# Patient Record
Sex: Female | Born: 1995 | Race: White | Hispanic: No | Marital: Married | Smoking: Former smoker
Health system: Southern US, Community
[De-identification: ages and names within clinical notes are randomized; demographics above are authoritative.]

## PROBLEM LIST (undated history)

## (undated) ENCOUNTER — Inpatient Hospital Stay: Payer: Self-pay

## (undated) DIAGNOSIS — F32A Depression, unspecified: Secondary | ICD-10-CM

## (undated) DIAGNOSIS — F419 Anxiety disorder, unspecified: Secondary | ICD-10-CM

## (undated) DIAGNOSIS — R55 Syncope and collapse: Secondary | ICD-10-CM

## (undated) DIAGNOSIS — N3010 Interstitial cystitis (chronic) without hematuria: Secondary | ICD-10-CM

## (undated) DIAGNOSIS — R000 Tachycardia, unspecified: Secondary | ICD-10-CM

## (undated) DIAGNOSIS — N83209 Unspecified ovarian cyst, unspecified side: Secondary | ICD-10-CM

## (undated) DIAGNOSIS — E559 Vitamin D deficiency, unspecified: Secondary | ICD-10-CM

## (undated) DIAGNOSIS — K219 Gastro-esophageal reflux disease without esophagitis: Secondary | ICD-10-CM

## (undated) DIAGNOSIS — N946 Dysmenorrhea, unspecified: Secondary | ICD-10-CM

## (undated) DIAGNOSIS — R109 Unspecified abdominal pain: Secondary | ICD-10-CM

## (undated) DIAGNOSIS — G90A Postural orthostatic tachycardia syndrome (POTS): Secondary | ICD-10-CM

## (undated) DIAGNOSIS — M5416 Radiculopathy, lumbar region: Secondary | ICD-10-CM

## (undated) DIAGNOSIS — K2970 Gastritis, unspecified, without bleeding: Secondary | ICD-10-CM

## (undated) DIAGNOSIS — N809 Endometriosis, unspecified: Secondary | ICD-10-CM

## (undated) DIAGNOSIS — R112 Nausea with vomiting, unspecified: Secondary | ICD-10-CM

## (undated) DIAGNOSIS — I951 Orthostatic hypotension: Secondary | ICD-10-CM

## (undated) DIAGNOSIS — I498 Other specified cardiac arrhythmias: Secondary | ICD-10-CM

## (undated) HISTORY — PX: OVARIAN CYST SURGERY: SHX726

## (undated) HISTORY — DX: Syncope and collapse: R55

## (undated) HISTORY — DX: Vitamin D deficiency, unspecified: E55.9

## (undated) HISTORY — DX: Dysmenorrhea, unspecified: N94.6

## (undated) HISTORY — PX: TYMPANOSTOMY TUBE PLACEMENT: SHX32

## (undated) HISTORY — DX: Depression, unspecified: F32.A

## (undated) HISTORY — DX: Unspecified ovarian cyst, unspecified side: N83.209

## (undated) HISTORY — DX: Gastritis, unspecified, without bleeding: K29.70

## (undated) HISTORY — DX: Anxiety disorder, unspecified: F41.9

## (undated) HISTORY — PX: TONSILLECTOMY AND ADENOIDECTOMY: SUR1326

## (undated) HISTORY — PX: APPENDECTOMY: SHX54

---
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
## 2015-01-02 ENCOUNTER — Ambulatory Visit (INDEPENDENT_AMBULATORY_CARE_PROVIDER_SITE_OTHER): Payer: No Typology Code available for payment source | Admitting: Internal Medicine

## 2015-01-02 ENCOUNTER — Encounter: Payer: Self-pay | Admitting: Internal Medicine

## 2015-01-02 VITALS — BP 100/80 | HR 79 | Ht 69.0 in | Wt 162.8 lb

## 2015-01-02 DIAGNOSIS — R9431 Abnormal electrocardiogram [ECG] [EKG]: Secondary | ICD-10-CM

## 2015-01-02 DIAGNOSIS — I471 Supraventricular tachycardia: Secondary | ICD-10-CM

## 2015-01-02 DIAGNOSIS — G901 Familial dysautonomia [Riley-Day]: Secondary | ICD-10-CM

## 2015-01-02 DIAGNOSIS — G909 Disorder of the autonomic nervous system, unspecified: Secondary | ICD-10-CM

## 2015-01-02 NOTE — Patient Instructions (Signed)
Your physician has recommended you make the following change in your medication: Start taking Therma Tabs by mouth twice a day.  Visit NDRF.org and [email or web address]  Your physician recommends that you schedule a follow-up appointment in: 6-8 weeks in [place] office with Dr. Graciela HusbandsKlein.

## 2015-01-02 NOTE — Progress Notes (Signed)
ELECTROPHYSIOLOGY CONSULT NOTE  Patient ID: Leah Hartman, MRN: [id number], DOB/AGE: 19/04/1996 18 y.o. Admit date: (Not on file) Date of Consult: 01/02/2015  Primary Physician: Faythe GheeFISHER,SUSAN W, PA-C Primary Cardiologist: Kirke CorinArida  Chief Complaint: syncope    HPI Leah Hartman is a [age] y.o. female  Seen at the request of her primary care team because of syncope in the ECG concerning for Brugada syndrome  She was well until the summer time. She was out running and had a syncopal episode. She was seen by her primary care team and a diagnosis of mononucleosis was made. In the wake of that acute illness she has suffered with ongoing problems with orthostatic intolerance triggered by prolonged standing or standing quickly or bending. She has had a number of episodes of syncope associated with such maneuvers; her sister who is a nurse described her as being extremely pale with this and with a difficult to obtain blood pressure. Patient herself notes that she was diaphoretic and these episodes were associated with residual orthostatic intolerance.  She's had profound fatigue sleeping 16-20 hours a day. This is been gradually improving over the last 3-6 weeks.  She has also had problems with vomiting and diarrhea;  this was frequently but not fully postprandial. She has not lost weight. Indeed her weight is up about 3 pounds since November Her diet is replete of fluids but deplete of sodium; she has had no edema.  Echocardiogram 11/15 was normal with normal chamber sizes     Echocardiogram 11/15 was normal   Past Medical History  Diagnosis Date  . Syncope and collapse   . Ovarian cyst   . Ovarian cyst rupture       Surgical History:  Past Surgical History  Procedure Laterality Date  . Tonsillectomy and adenoidectomy    . Appendectomy    . Tympanostomy tube placement       Home Meds: Prior to Admission medications   Medication Sig Start Date End Date Taking? Authorizing  Provider  norethindrone-ethinyl estradiol (JUNEL 1/20) 1-20 MG-MCG tablet Take 1 tablet by mouth daily.   Yes Historical Provider, MD       Allergies: No Known Allergies  History   Social History  . Marital Status: Single    Spouse Name: N/A    Number of Children: N/A  . Years of Education: N/A   Occupational History  . Not on file.   Social History Main Topics  . Smoking status: Never Smoker   . Smokeless tobacco: Not on file  . Alcohol Use: No  . Drug Use: No  . Sexual Activity: Not on file   Other Topics Concern  . Not on file   Social History Narrative     Family History  Problem Relation Age of Onset  . Hypertension Mother      ROS:  Please see the history of present illness.     All other systems reviewed and negative.    Physical Exam: Blood pressure 100/80, pulse 79, height 5\' 9"  (1.753 m), weight 162 lb 12.8 oz (73.846 kg). General: Well developed, well nourished female in no acute distress. Head: Normocephalic, atraumatic, sclera non-icteric, no xanthomas, nares are without discharge. EENT: normal Lymph Nodes:  none Back: without scoliosis/kyphosis, no CVA tendersness Neck: Negative for carotid bruits. JVD not elevated. Lungs: Clear bilaterally to auscultation without wheezes, rales, or rhonchi. Breathing is unlabored. Heart: RRR with S1 S2. No  murmur , rubs, or gallops appreciated. Abdomen: Soft, non-tender, non-distended with  normoactive bowel sounds. No hepatomegaly. No rebound/guarding. No obvious abdominal masses. Msk:  Strength and tone appear normal for age. Extremities: No clubbing or cyanosis. No edema.  Distal pedal pulses are 2+ and equal bilaterally. Skin: Warm and Dry Neuro: Alert and oriented X 3. CN III-XII intact Grossly normal sensory and motor function . Psych:  Responds to questions appropriately with a normal affect.      Labs:  EKG: Sinus rhythm at 79 Intervals 11/02/37 there is an RSR prime in lead V1  Holter sinus  rhythm with frequent PACs and nonsustained atrial tachycardia is probably rare PVCs  Assessment and Plan: Autonomic dysfunction  History of mononucleosis  RSR prime in lead V1  Atrial Tachycardia  PVCs  Her symptoms are somewhat atypical but still strongly suggestive of an autonomic disorder that is temporally related to a diagnosis of mononucleosis. Her symptoms are gradually abating. We have spent a long time reviewing the physiology of autonomic dysfunction and have given her information from the [facility] as well as website information for NDRF.org and [email or web address].  We have stressed the importance of volume and salt repletion and will begin her on ThermaTabs.  I suggested that they follow up with infectious diseases to look at there is history of mononucleosis.  RsR' in V1 can be associated with atrial septal defects. Atrial chamber sizes and right ventricular chamber sizes are described as normal. Her symptoms are not consistent with Brugada, an issue that was raised by her ECG  Arrhythmia noted on her Holter is noteworthy given her use. In the context of normal LV function and normal chamber sizes, at this point, I would not pursue further evaluation. If her symptoms do not abate will repeat the holter just to look for evidence  Of cardiac involvement    I am encouraged taht the echo was normal 3 months after the infection as relates to possible myocarditis occurring as consequence of EBV   J Neurol Neurosurg Psychiatry 2013;84:98-106 doi:10.1136/[id number]  Neuromuscular The spectrum of immune-mediated autonomic neuropathies: insights from the clinicopathological features   Sherryl Manges

## 2015-01-06 ENCOUNTER — Telehealth: Payer: Self-pay | Admitting: Internal Medicine

## 2015-01-06 DIAGNOSIS — B2790 Infectious mononucleosis, unspecified without complication: Secondary | ICD-10-CM

## 2015-01-06 NOTE — Telephone Encounter (Signed)
New problem    Pt's mom need to speak to nurse concerning pt seeing an Infectious Disease Doctor. Please call pt's mom/

## 2015-01-06 NOTE — Telephone Encounter (Addendum)
She was asking if Dr. Graciela HusbandsKlein will refer to Dr. Drue SecondSnider, Infectious Disease.  Per office note - to investigate history of mononucleosis. Informed her that it would be next week before addressed, as Dr. Graciela HusbandsKlein is not back until Tuesday. Patient's mother verbalized understanding and agreeable to plan.

## 2015-01-11 NOTE — Telephone Encounter (Signed)
Informed patient's mother I was placing referral for infectious disease.  She is aware someone will contacting them to arrange the appt.

## 2015-01-17 ENCOUNTER — Telehealth: Payer: Self-pay | Admitting: Internal Medicine

## 2015-01-17 NOTE — Telephone Encounter (Signed)
New message      Pt has not heard from the infectious disease doctor.  Please call

## 2015-01-17 NOTE — Telephone Encounter (Signed)
Informed referral placed last week.  Gave her inf. disease office number and told her to call them if she had not heard anything by next week.  She is agreeable.

## 2015-01-19 ENCOUNTER — Ambulatory Visit: Payer: Self-pay | Admitting: Obstetrics and Gynecology

## 2015-01-30 ENCOUNTER — Ambulatory Visit: Payer: No Typology Code available for payment source | Admitting: Cardiovascular Disease

## 2015-01-31 ENCOUNTER — Encounter: Payer: Self-pay | Admitting: Internal Medicine

## 2015-01-31 ENCOUNTER — Ambulatory Visit (INDEPENDENT_AMBULATORY_CARE_PROVIDER_SITE_OTHER): Payer: No Typology Code available for payment source | Admitting: Internal Medicine

## 2015-01-31 VITALS — BP 110/68 | HR 75 | Temp 97.9°F | Ht 69.0 in | Wt 165.2 lb

## 2015-01-31 DIAGNOSIS — R112 Nausea with vomiting, unspecified: Secondary | ICD-10-CM | POA: Insufficient documentation

## 2015-01-31 DIAGNOSIS — R55 Syncope and collapse: Secondary | ICD-10-CM | POA: Insufficient documentation

## 2015-01-31 DIAGNOSIS — R5382 Chronic fatigue, unspecified: Secondary | ICD-10-CM | POA: Insufficient documentation

## 2015-01-31 NOTE — Progress Notes (Signed)
Patient ID: SUNG RENTON, female   DOB: 1996-06-13, 19 y.o.   MRN: [id number]         [facility]  Reason for Consult: Chronic fatigue and a history of mononucleosis Referring Physician: Dr. Berton Mount  Patient Active Problem List   Diagnosis Date Noted  . Chronic fatigue 01/31/2015    Priority: High  . Syncope 01/31/2015  . Nausea and vomiting 01/31/2015  . SOB (shortness of breath) 09/27/2014  . PSVT (paroxysmal supraventricular tachycardia) 09/27/2014    Patient's Medications  New Prescriptions   No medications on file  Previous Medications   NORETHINDRONE-ETHINYL ESTRADIOL (JUNEL 1/20) 1-20 MG-MCG TABLET    Take 1 tablet by mouth daily.  Modified Medications   No medications on file  Discontinued Medications   No medications on file    Recommendations: 1. No further workup or treatment for infection indicated at this time 2. I offered to review progress notes and lab work from recent visits with her primary care provider, Greig Right 3. I have given her written information about systemic exertion intolerance disorder/chronic fatigue syndrome   Assessment: Ms. Reierson has chronic fatigue associated with cardiac symptoms and recurrent nausea and vomiting. I do not think that her illness is related to Epstein-Barr virus mononucleosis. Her serologies suggest remote infection that is inactive. Her recent clinical illness does not suggest a mononucleosis syndrome. Although she describes some mild subjective chills and fevers I do not see any evidence of any other active infection. Her symptoms overlap considerably with syndromes such as chronic fatigue syndrome or as it is now called systemic exertion intolerance disease and postural orthostatic tachycardia syndrome. She has already received some information about POTS from Dr. Graciela Husbands and I have given her written information about systemic exertion intolerance disease today. I am not entirely sure  how either of these disorders would explain her recurrent nausea and vomiting. She does mention that because of her recent weight gain she has been dieting which he has never done before. She denies doing anything that might precipitate nausea and vomiting that she is aware of. She might benefit from further evaluation for specific causes of nausea and vomiting. Although she describes some recent irritability and possible mild depression certainly do not think that depression is the primary cause of her symptoms. I let her know that most people with these types of unexplained syndromes will eventually get better. Although her fatigue is exacerbated by exertion it is important that she continued to remain as active as possible. I suggested that she try different types of exercise and begin gradually to see what she can tolerate.  HPI: INFANT DOANE is a 19 y.o. female who was in very good health until last August. She recalls developing a red blistering rash on her extremities and trunk. She recalls being evaluated for tick fever and Lyme disease. She was told that these tests were negative. She did not have any fever at that time and did not feel particularly bad. The rash eventually resolved with topical steroid therapy. She is an avid runner and was out on a normal run in late August when she collapsed without warning. Her mother, who is with her today, states that she fell so hard she bruised her entire right side. She was poorly responsive when they first got to her after hearing her call out. She was able to get up and slowly walk home under her own power. Over the next several months she had  several episodes where she felt very faint. She would have occasions when she would be poorly responsive and sterile off into space. She collapsed one more time when standing in her living room. She fell onto her couch and was not injured. He was seen and evaluated with lab work that I'm told was normal. She was seen  by cardiology and was noted to have some sinus tachycardia. Her mother recalls that there were sometimes what her blood pressure was elevated and other times when it was low. She has been told that she has had episodes of dehydration. In December she had an Epstein-Barr panel obtained that showed evidence of remote, inactive infection.  She has had progressive, severe, debilitating fatigue. She completed high school through home schooling. She is now waiting to make a decision about college. 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 Her mother states that it's not unusual for her to sleep up to 21 hours a day. She has given up running and other normal activities because exertion exacerbates her fatigue.  She has also had intermittent episodes of sudden, explosive nausea and vomiting. She is not aware of any triggers for this. She states that she has a poor appetite but is actually gained 15-20 pounds over the last several months.  She notes being very forgetful. She has no history of depression but states that since she became sick she has become increasingly anxious and irritable. She states that 1 minute she can be extremely happy and the next minute she will lash out at someone for doing something quite insignificant. She notes that she can burst into tears without any warning.  Review of Systems: Constitutional: positive for anorexia, chills, fatigue, fevers and sweats, negative for weight loss Eyes: negative Ears, nose, mouth, throat, and face: negative Respiratory: positive for dyspnea on exertion, negative for cough, sputum and wheezing Cardiovascular: positive for chest pain, dyspnea, irregular heart beat, near-syncope, palpitations and syncope, negative for orthopnea and paroxysmal nocturnal dyspnea Gastrointestinal: positive for nausea and vomiting, negative for abdominal pain, constipation, diarrhea and reflux  symptoms Genitourinary:negative    Past Medical History  Diagnosis Date  . Syncope and collapse   . Ovarian cyst   . Ovarian cyst rupture     History  Substance Use Topics  . Smoking status: Never Smoker   . Smokeless tobacco: Not on file  . Alcohol Use: No    Family History  Problem Relation Age of Onset  . Hypertension Mother    No Known Allergies  OBJECTIVE: Blood pressure 110/68, pulse 75, temperature 97.9 F (36.6 C), temperature source Oral, height 5\' 9"  (1.753 m), weight 165 lb 4 oz (74.957 kg). General: She is a well dressed and healthy-appearing young female. She is comfortable and in no distress Skin: No rash Lymph nodes: No palpable adenopathy Eyes: Normal external exam Oral: No oropharyngeal lesions. Teeth are in good condition Lungs: Clear Cor: Regular S1 and S2 with no murmurs Abdomen: Soft and nontender. I do not palpate her liver, spleen or other masses Joints and extremities: Normal Neuro: Alert with normal speech and conversation Mood: Appropriate. She does not seem anxious or depressed  Microbiology: No results found for this or any previous visit (from the past 240 hour(s)).  Cliffton AstersJohn Shondell Poulson, MD [facility] [phone] pager   [phone] cell 01/31/2015, 5:52 PM

## 2015-02-07 ENCOUNTER — Telehealth: Payer: Self-pay | Admitting: Internal Medicine

## 2015-02-07 NOTE — Telephone Encounter (Signed)
I called Leah Hartman today to let her know that I had reviewed records from her primary care provider, Leah Hartman. Previous CBC, CMP and TSH were all normal. Serologic testing last September for [facility] spotted fever and Lyme were both negative. A brain MRI scan was normal last November. A HIDA scan was normal in January. I do not see any evidence of active infection contributing to her fatigue. She states that she is feeling better. She joined a gym after her visit with me last week and has started gentle exercise.

## 2015-02-20 ENCOUNTER — Ambulatory Visit: Payer: Self-pay | Admitting: Family Medicine

## 2015-02-20 LAB — COMPREHENSIVE METABOLIC PANEL
ALBUMIN: 4.3 g/dL
ALT: 16 U/L
Alkaline Phosphatase: 42 U/L
Anion Gap: 10 (ref 7–16)
BUN: 15 mg/dL
Bilirubin,Total: 1.2 mg/dL
CALCIUM: 9.2 mg/dL
CHLORIDE: 105 mmol/L
Co2: 23 mmol/L
Creatinine: 0.65 mg/dL
Glucose: 97 mg/dL
Potassium: 3.9 mmol/L
SGOT(AST): 26 U/L
Sodium: 138 mmol/L
TOTAL PROTEIN: 7.7 g/dL

## 2015-02-20 LAB — CBC WITH DIFFERENTIAL/PLATELET
BASOS PCT: 0.2 %
Basophil #: 0 10*3/uL (ref 0.0–0.1)
EOS ABS: 0.1 10*3/uL (ref 0.0–0.7)
Eosinophil %: 0.6 %
HCT: 42.4 % (ref 35.0–47.0)
HGB: 14.1 g/dL (ref 12.0–16.0)
LYMPHS PCT: 7.4 %
Lymphocyte #: 0.7 10*3/uL — ABNORMAL LOW (ref 1.0–3.6)
MCH: 29.5 pg (ref 26.0–34.0)
MCHC: 33.3 g/dL (ref 32.0–36.0)
MCV: 89 fL (ref 80–100)
Monocyte #: 0.5 x10 3/mm (ref 0.2–0.9)
Monocyte %: 5.4 %
Neutrophil #: 8.5 10*3/uL — ABNORMAL HIGH (ref 1.4–6.5)
Neutrophil %: 86.4 %
Platelet: 161 10*3/uL (ref 150–440)
RBC: 4.79 10*6/uL (ref 3.80–5.20)
RDW: 13.9 % (ref 11.5–14.5)
WBC: 9.9 10*3/uL (ref 3.6–11.0)

## 2015-02-20 LAB — URINALYSIS, COMPLETE
BACTERIA: NEGATIVE
Glucose,UR: NEGATIVE
NITRITE: NEGATIVE
Ph: 6 (ref 5.0–8.0)
Specific Gravity: 1.025 (ref 1.000–1.030)

## 2015-02-20 LAB — PREGNANCY, URINE: PREGNANCY TEST, URINE: NEGATIVE m[IU]/mL

## 2015-02-20 LAB — AMYLASE: Amylase: 89 U/L

## 2015-02-20 LAB — LIPASE, BLOOD: Lipase: 29 U/L

## 2015-02-21 LAB — URINE CULTURE

## 2015-02-28 ENCOUNTER — Encounter: Payer: Self-pay | Admitting: Internal Medicine

## 2015-02-28 ENCOUNTER — Other Ambulatory Visit: Payer: Self-pay | Admitting: *Deleted

## 2015-02-28 ENCOUNTER — Ambulatory Visit (INDEPENDENT_AMBULATORY_CARE_PROVIDER_SITE_OTHER): Payer: No Typology Code available for payment source | Admitting: Internal Medicine

## 2015-02-28 VITALS — BP 100/60 | HR 69 | Ht 69.0 in | Wt 167.2 lb

## 2015-02-28 DIAGNOSIS — I471 Supraventricular tachycardia: Secondary | ICD-10-CM | POA: Diagnosis not present

## 2015-02-28 DIAGNOSIS — R112 Nausea with vomiting, unspecified: Secondary | ICD-10-CM

## 2015-02-28 MED ORDER — ONDANSETRON HCL 4 MG PO TABS: 4.0000 mg | ORAL_TABLET | ORAL | Status: DC

## 2015-02-28 NOTE — Progress Notes (Signed)
,        Patient Care Team: Bartholomew BoardsSusan W Fisher, PA-C as PCP - General (Physician Assistant)   HPI  Leah Hartman is a 19 y.o. 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 These occurred in the wake of a mononucleosis infection. She is seen ID in the interim; they apparently had no great insights.  She finds that she is putting on weight. This is very disruptive to her self image. She also describes vomiting at almost every meal. Her mother confirms this   She is deplete of salt and water in her diet. She was unable to take the former because of GI irritation.  She's had no significant interval palpitations.        Past Medical History  Diagnosis Date  . Syncope and collapse   . Ovarian cyst   . Ovarian cyst rupture     Past Surgical History  Procedure Laterality Date  . Tonsillectomy and adenoidectomy    . Appendectomy    . Tympanostomy tube placement    . Ovarian cyst surgery Bilateral     Current Outpatient Prescriptions  Medication Sig Dispense Refill  . norethindrone-ethinyl estradiol-iron (MICROGESTIN FE,GILDESS FE,LOESTRIN FE) 1.5-30 MG-MCG tablet Take 1 tablet by mouth daily.     No current facility-administered medications for this visit.    No Known Allergies  Review of Systems negative except from HPI and PMH  Physical Exam Ht 5\' 9"  (1.753 m)  Wt 167 lb 4 oz (75.864 kg)  BMI 24.69 kg/m2 Well developed and nourished in no acute distress HENT normal Neck supple with JVP-flat Clear Regular rate and rhythm, no murmurs or gallops Abd-soft with active BS No Clubbing cyanosis edema Skin-warm and dry A & Oriented  Grossly normal sensory and motor function   ECG was ordered and demonstrated sinus rhythm  Assessment and plan  Dysautonomia  She is struggling with nausea and vomiting. I'm going to prescribe Zofran 4 mg to take prior to meals. I've also been in touch with [facility] Lere[facility] who is giving me the name of the  gastroenterologist with whom she works with dysautonomic patients. We will refer this lady to Dr. Lavona MoundShimpi ([place])   I've also stressed with her the importance of ongoing exercise even more importantly salt and water repletion  We spent more than 50% of our >25 min visit in face to face counseling regarding the above

## 2015-02-28 NOTE — Patient Instructions (Addendum)
Your physician has recommended you make the following change in your medication: START ZOFRAN ( 4 mg ) 1 tablet before lunch, 1 tablet before dinner. Sent into pharmacy today as discussed with patient[facility] [place].   Your physician recommends that you schedule a follow-up appointment in: 3 months with Dr. Graciela HusbandsKlein.

## 2015-03-01 ENCOUNTER — Telehealth: Payer: Self-pay | Admitting: *Deleted

## 2015-03-01 DIAGNOSIS — R112 Nausea with vomiting, unspecified: Secondary | ICD-10-CM

## 2015-03-01 NOTE — Telephone Encounter (Signed)
S/w pt to let pt know waiting for doctor to call back to set up appointment, Also let pt know Dory HornSherri Price, RN, is Dr. Odessa FlemingKlein's nurse.  If pt needed to contact it would be Sherri or I.  Will also route message to [facility].

## 2015-03-01 NOTE — Telephone Encounter (Signed)
Per Dr. Graciela HusbandsKlein pt needs to be set up with Dr. Toni Arthursahul Shimpi @ [phone] DX: Nausea and Vomiting associated with Autonomic Disorder.  Referral is in pt's chart Pt is aware the Doctor is being contacted.   LM on Doctor's vm to contact our office and either speak with Brien Matesanielle Gonzalez, CMA or Dory HornSherri Price, RN Will cc Sherri on chart but also made Sherri aware by phone

## 2015-03-07 NOTE — Telephone Encounter (Signed)
lmtcb  (want to confirm Dr. Dewayne ShorterShimpi's office has not contacted her -- I doubt this has occurred but want to confirm) also found Shimpi's office number -- [facility] [phone]

## 2015-03-08 NOTE — Telephone Encounter (Signed)
Pt mother calling stating it has been over a week, and no one has called her.  Stated we tried to call her yesterday and that we left a vm  But she said they did not get anything I gave her the number for Dr Dewayne ShorterShimpi's office and she said if she makes the apt she would need us to send a referral for dr Graciela Husbandsklein is not in their network.   Please advise.

## 2015-03-08 NOTE — Telephone Encounter (Signed)
Advised that I will place referral through our office and see if we can get this going. I will place as high priority referral. Patient's mother understands someone will be contacting them to arrange this.

## 2015-03-21 ENCOUNTER — Telehealth: Payer: Self-pay | Admitting: Internal Medicine

## 2015-03-21 DIAGNOSIS — R112 Nausea with vomiting, unspecified: Secondary | ICD-10-CM

## 2015-03-21 NOTE — Telephone Encounter (Signed)
Heather Obtained new referral for GI Dr. Servando SnareWohl.  Patient is now scheduled form 03-27-15 at 830 am in [place] office .  Patients mother was called to notify of this and information for care faxed to office .

## 2015-03-21 NOTE — Telephone Encounter (Signed)
Please call patients Mother to discuss referrel to GI Dr. Servando SnareWohl.  They cannot see her until October bc former referral to shimpi is still active.  Communicated with Herbert SetaHeather and she will call patient mother in 10 min

## 2015-04-05 ENCOUNTER — Encounter: Payer: Self-pay | Admitting: Anesthesiology

## 2015-04-05 NOTE — Discharge Instructions (Signed)

## 2015-04-07 ENCOUNTER — Encounter: Payer: Self-pay | Admitting: Gastroenterology

## 2015-04-07 ENCOUNTER — Ambulatory Visit
Admission: RE | Admit: 2015-04-07 | Discharge: 2015-04-07 | Disposition: A | Discharge status: Home / Self Care | Payer: No Typology Code available for payment source | Source: Ambulatory Visit | Attending: Gastroenterology | Admitting: Gastroenterology

## 2015-04-07 ENCOUNTER — Encounter
Admission: RE | Disposition: A | Discharge status: Home / Self Care | Payer: Self-pay | Source: Ambulatory Visit | Attending: Gastroenterology

## 2015-04-07 ENCOUNTER — Ambulatory Visit: Payer: No Typology Code available for payment source | Admitting: Anesthesiology

## 2015-04-07 ENCOUNTER — Other Ambulatory Visit: Payer: Self-pay | Admitting: Gastroenterology

## 2015-04-07 DIAGNOSIS — Z79899 Other long term (current) drug therapy: Secondary | ICD-10-CM | POA: Diagnosis not present

## 2015-04-07 DIAGNOSIS — R112 Nausea with vomiting, unspecified: Secondary | ICD-10-CM | POA: Diagnosis present

## 2015-04-07 DIAGNOSIS — K219 Gastro-esophageal reflux disease without esophagitis: Secondary | ICD-10-CM | POA: Insufficient documentation

## 2015-04-07 DIAGNOSIS — Z833 Family history of diabetes mellitus: Secondary | ICD-10-CM | POA: Diagnosis not present

## 2015-04-07 DIAGNOSIS — K2970 Gastritis, unspecified, without bleeding: Secondary | ICD-10-CM | POA: Insufficient documentation

## 2015-04-07 DIAGNOSIS — Z8249 Family history of ischemic heart disease and other diseases of the circulatory system: Secondary | ICD-10-CM | POA: Diagnosis not present

## 2015-04-07 HISTORY — PX: ESOPHAGOGASTRODUODENOSCOPY: SHX5428

## 2015-04-07 HISTORY — DX: Nausea with vomiting, unspecified: R11.2

## 2015-04-07 HISTORY — DX: Gastro-esophageal reflux disease without esophagitis: K21.9

## 2015-04-07 HISTORY — DX: Unspecified abdominal pain: R10.9

## 2015-04-07 SURGERY — EGD (ESOPHAGOGASTRODUODENOSCOPY)
Anesthesia: Monitor Anesthesia Care | Wound class: Clean Contaminated

## 2015-04-07 MED ORDER — PROMETHAZINE HCL 25 MG/ML IJ SOLN: 6.2500 mg | INTRAMUSCULAR | Status: DC | PRN

## 2015-04-07 MED ORDER — HYDROMORPHONE HCL 1 MG/ML IJ SOLN: 0.2500 mg | INTRAMUSCULAR | Status: DC | PRN

## 2015-04-07 MED ORDER — LACTATED RINGERS IV SOLN
INTRAVENOUS | Status: DC
  Administered 2015-04-07 (×2): via INTRAVENOUS

## 2015-04-07 MED ORDER — KETOROLAC TROMETHAMINE 30 MG/ML IJ SOLN: 30.0000 mg | Freq: Once | INTRAMUSCULAR | Status: DC | PRN

## 2015-04-07 MED ORDER — PROPOFOL 10 MG/ML IV BOLUS
INTRAVENOUS | Status: DC | PRN
  Administered 2015-04-07: 20 mg via INTRAVENOUS
  Administered 2015-04-07: 30 mg via INTRAVENOUS

## 2015-04-07 MED ORDER — OXYCODONE HCL 5 MG/5ML PO SOLN: 5.0000 mg | Freq: Once | ORAL | Status: DC | PRN

## 2015-04-07 MED ORDER — OXYCODONE HCL 5 MG PO TABS: 5.0000 mg | ORAL_TABLET | Freq: Once | ORAL | Status: DC | PRN

## 2015-04-07 SURGICAL SUPPLY — 7 items
BLOCK BITE 60FR ADLT L/F GRN (MISCELLANEOUS) ×3 IMPLANT
CANISTER SUCT 1200ML W/VALVE (MISCELLANEOUS) ×3 IMPLANT
FORCEPS BIOP RAD 4 LRG CAP 4 (CUTTING FORCEPS) ×3 IMPLANT
GOWN CVR UNV OPN BCK APRN NK (MISCELLANEOUS) ×2 IMPLANT
GOWN ISOL THUMB LOOP REG UNIV (MISCELLANEOUS) ×4
KIT ENDO PROCEDURE OLY (KITS) ×3 IMPLANT
WATER STERILE IRR 500ML POUR (IV SOLUTION) ×3 IMPLANT

## 2015-04-07 NOTE — Anesthesia Preprocedure Evaluation (Signed)
Anesthesia Evaluation  Patient identified by MRN, date of birth, ID band Patient awake    Reviewed: Allergy & Precautions, NPO status , Patient's Chart, lab work & pertinent test results  Airway Mallampati: II  TM Distance: >3 FB Neck ROM: Full    Dental no notable dental hx.    Pulmonary neg pulmonary ROS,  breath sounds clear to auscultation  Pulmonary exam normal       Cardiovascular negative cardio ROS Normal cardiovascular examRhythm:Regular Rate:Normal     Neuro/Psych negative neurological ROS  negative psych ROS   GI/Hepatic negative GI ROS, Neg liver ROS, GERD-  ,  Endo/Other  negative endocrine ROS  Renal/GU negative Renal ROS  negative genitourinary   Musculoskeletal negative musculoskeletal ROS (+)   Abdominal   Peds negative pediatric ROS (+)  Hematology negative hematology ROS (+)   Anesthesia Other Findings   Reproductive/Obstetrics negative OB ROS                             Anesthesia Physical Anesthesia Plan  ASA: II  Anesthesia Plan: MAC   Post-op Pain Management:    Induction: Intravenous  Airway Management Planned:   Additional Equipment:   Intra-op Plan:   Post-operative Plan: Extubation in OR  Informed Consent: I have reviewed the patients History and Physical, chart, labs and discussed the procedure including the risks, benefits and alternatives for the proposed anesthesia with the patient or authorized representative who has indicated his/her understanding and acceptance.   Dental advisory given  Plan Discussed with: CRNA  Anesthesia Plan Comments:         Anesthesia Quick Evaluation  

## 2015-04-07 NOTE — Op Note (Signed)
[facility] Patient Name: Leah Hartman Procedure Date: 04/07/2015 9:14 AM MRN: [id number] Account #: [id number] Date of Birth: 03/07/1996 Admit Type: Outpatient Age: 19 [facility] Gender: Female Note Status: Finalized Procedure:         Upper GI endoscopy Indications:       Nausea with vomiting Providers:         Midge Miniumarren Lyndzie Zentz, MD Referring MD:      Chelsea AusMuhammad A. Kirke CorinArida, MD (Referring MD) Medicines:         Propofol per Anesthesia Complications:     No immediate complications. Procedure:         Pre-Anesthesia Assessment:                    - Prior to the procedure, a History and Physical was                     performed, and patient medications and allergies were                     reviewed. The patient's tolerance of previous anesthesia                     was also reviewed. The risks and benefits of the procedure                     and the sedation options and risks were discussed with the                     patient. All questions were answered, and informed consent                     was obtained. Prior Anticoagulants: The patient has taken                     no previous anticoagulant or antiplatelet agents. ASA                     Grade Assessment: II - A patient with mild systemic                     disease. After reviewing the risks and benefits, the                     patient was deemed in satisfactory condition to undergo                     the procedure.                    After obtaining informed consent, the endoscope was passed                     under direct vision. Throughout the procedure, the                     patient's blood pressure, pulse, and oxygen saturations                     were monitored continuously. The Olympus GIF-HQ190                     Endoscope (S#. [id number]) was introduced through the mouth,  and advanced to the second part of duodenum. The upper GI   endoscopy was accomplished without difficulty. The patient                     tolerated the procedure well. Findings:      The examined esophagus was normal. Random biopsies were obtained in the       middle third of the esophagus with cold forceps for histology.      Localized minimal inflammation characterized by erythema was found in       the gastric antrum. Biopsies were taken with a cold forceps for       histology.      The examined duodenum was normal. Biopsies were taken with a cold       forceps for histology. Impression:        - Normal esophagus.                    - Gastritis. Biopsied.                    - Normal examined duodenum. Biopsied.                    - Random biopsies were obtained in the middle third of the                     esophagus. Recommendation:    - Await pathology results. Procedure Code(s): --- Professional ---                    [phone] [id number], Esophagogastroduodenoscopy, flexible, transoral;                     with biopsy, single or multiple Diagnosis Code(s): --- Professional ---                    R11.2, Nausea with vomiting, unspecified                    K29.70, Gastritis, unspecified, without bleeding CPT copyright 2014 American Medical Association. All rights reserved. The codes documented in this report are preliminary and upon coder review may  be revised to meet current compliance requirements. Midge Miniumarren Dezi Brauner, MD 04/07/2015 9:33:43 AM This report has been signed electronically. Number of Addenda: 0 Note Initiated On: 04/07/2015 9:14 AM Total Procedure Duration: 0 hours 7 minutes 49 seconds       [facility]

## 2015-04-07 NOTE — Transfer of Care (Signed)
Immediate Anesthesia Transfer of Care Note  Patient: Leah Hartman  Procedure(s) Performed: Procedure(s): ESOPHAGOGASTRODUODENOSCOPY (EGD) (N/A)  Patient Location: PACU  Anesthesia Type: MAC  Level of Consciousness: awake, alert  and patient cooperative  Airway and Oxygen Therapy: Patient Spontanous Breathing and Patient connected to supplemental oxygen  Post-op Assessment: Post-op Vital signs reviewed, Patient's Cardiovascular Status Stable, Respiratory Function Stable, Patent Airway and No signs of Nausea or vomiting  Post-op Vital Signs: Reviewed and stable  Complications: No apparent anesthesia complications

## 2015-04-07 NOTE — H&P (Signed)
  [facility]  [address] [place], [place] [address] Phone: [phone] Fax : [phone]  Primary Care Physician:  Faythe GheeFISHER,SUSAN W, PA-C Primary Gastroenterologist:  Dr. Servando SnareWohl  Pre-Procedure History & Physical: HPI:  Leah Hartman is a 19 y.o. female is here for an endoscopy.   Past Medical History  Diagnosis Date  . Syncope and collapse   . Ovarian cyst   . Ovarian cyst rupture   . Nausea & vomiting   . Abdominal pain   . GERD (gastroesophageal reflux disease)     heartburn     Past Surgical History  Procedure Laterality Date  . Tonsillectomy and adenoidectomy    . Appendectomy    . Tympanostomy tube placement    . Ovarian cyst surgery Bilateral     Prior to Admission medications   Medication Sig Start Date End Date Taking? Authorizing Provider  norethindrone-ethinyl estradiol-iron (MICROGESTIN FE,GILDESS FE,LOESTRIN FE) 1.5-30 MG-MCG tablet Take 1 tablet by mouth daily. PM   Yes Historical Provider, MD  ondansetron (ZOFRAN) 4 MG tablet Take 1 tablet (4 mg total) by mouth every 4 (four) hours. 1 tablet before lunch 1 tablet before dinner 02/28/15  Yes [facility] C Klein, MD    Allergies as of 03/29/2015  . (No Known Allergies)    Family History  Problem Relation Age of Onset  . Hypertension Mother     History   Social History  . Marital Status: Single    Spouse Name: N/A  . Number of Children: N/A  . Years of Education: N/A   Occupational History  . Not on file.   Social History Main Topics  . Smoking status: Never Smoker   . Smokeless tobacco: Not on file  . Alcohol Use: No  . Drug Use: No  . Sexual Activity: Not Currently   Other Topics Concern  . Not on file   Social History Narrative    Review of Systems: See HPI, otherwise negative ROS  Physical Exam: BP 101/67 mmHg  Pulse 75  Temp(Src) 97.9 F (36.6 C) (Tympanic)  Resp 16  Ht 5\' 9"  (1.753 m)  Wt 161 lb (73.029 kg)  BMI 23.76 kg/m2  SpO2 100%  LMP  03/12/2015 (Approximate) General:   Alert,  pleasant and cooperative in NAD Head:  Normocephalic and atraumatic. Neck:  Supple; no masses or thyromegaly. Lungs:  Clear throughout to auscultation.    Heart:  Regular rate and rhythm. Abdomen:  Soft, nontender and nondistended. Normal bowel sounds, without guarding, and without rebound.   Neurologic:  Alert and  oriented x4;  grossly normal neurologically.  Impression/Plan: Leah Hartman is here for an endoscopy to be performed for nausea and vomiting  Risks, benefits, limitations, and alternatives regarding  endoscopy have been reviewed with the patient.  Questions have been answered.  All parties agreeable.   [facility],Nicolus Ose, MD  04/07/2015, 9:28 AM

## 2015-04-07 NOTE — Anesthesia Postprocedure Evaluation (Signed)
  Anesthesia Post-op Note  Patient: Leah Hartman  Procedure(s) Performed: Procedure(s): ESOPHAGOGASTRODUODENOSCOPY (EGD) (N/A)  Anesthesia type:MAC  Patient location: PACU  Post pain: Pain level controlled  Post assessment: Post-op Vital signs reviewed, Patient's Cardiovascular Status Stable, Respiratory Function Stable, Patent Airway and No signs of Nausea or vomiting  Post vital signs: Reviewed and stable  Last Vitals:  Filed Vitals:   04/07/15 0930  BP:   Pulse: 86  Temp: 36.1 C  Resp: 14    Level of consciousness: awake, alert  and patient cooperative  Complications: No apparent anesthesia complications

## 2015-04-12 ENCOUNTER — Telehealth: Payer: Self-pay | Admitting: Internal Medicine

## 2015-04-12 NOTE — Telephone Encounter (Signed)
S/w pt mother who indicated pt states heart is still racing and feels BP is low during these episodes. Has not taken BP.  Per mother: Pt experiencing daily nosebleeds and vomiting Constantly fatigued Recently started part-time job at [employer] and sleeps whenever she is not at work EGD Friday and mother indicated report showed increased cortisol level. Has followup with PCP  Mother states symptoms have "come back with vengeance"  Would like daughter to be seen by Dr. Graciela HusbandsKlein sooner than June 7. Advised mother to obtain BP cuff, record readings and report to us Will forward to Dr. Graciela HusbandsKlein in hopes for an earlier appt and for him to advise.

## 2015-04-12 NOTE — Telephone Encounter (Signed)
Patient has increased symptoms per mother and would like to be seen sooner.  Rescheduled for earlier appt. However this is still on June 7th.  Patient having frequesnt nose bleeds, increased fatigue, and heart feels like its pounding out of chest.

## 2015-04-17 ENCOUNTER — Telehealth: Payer: Self-pay

## 2015-04-17 NOTE — Telephone Encounter (Signed)
S/w Theodoro Gristawn Ucci, mother who indicated Irving Burtonmily will be here tomorrow at 9:00am for appt w Dr. Graciela HusbandsKlein

## 2015-04-17 NOTE — Telephone Encounter (Signed)
Left message at home and on both cell phone numbers to call back regarding 5/24 appt.

## 2015-04-18 ENCOUNTER — Encounter: Payer: Self-pay | Admitting: Internal Medicine

## 2015-04-18 ENCOUNTER — Ambulatory Visit (INDEPENDENT_AMBULATORY_CARE_PROVIDER_SITE_OTHER): Payer: No Typology Code available for payment source | Admitting: Internal Medicine

## 2015-04-18 VITALS — BP 108/77 | HR 71 | Ht 69.0 in | Wt 160.0 lb

## 2015-04-18 DIAGNOSIS — I951 Orthostatic hypotension: Secondary | ICD-10-CM

## 2015-04-18 DIAGNOSIS — R42 Dizziness and giddiness: Secondary | ICD-10-CM

## 2015-04-18 DIAGNOSIS — R000 Tachycardia, unspecified: Secondary | ICD-10-CM | POA: Diagnosis not present

## 2015-04-18 DIAGNOSIS — G90A Postural orthostatic tachycardia syndrome (POTS): Secondary | ICD-10-CM

## 2015-04-18 NOTE — Patient Instructions (Addendum)
Medication Instructions:  Your physician recommends that you continue on your current medications as directed. Please refer to the Current Medication list given to you today.   Labwork: None  Testing/Procedures: 24 hour urine collection. Please go to [facility] to pick up test  Follow-Up: Your physician recommends that you schedule a follow-up appointment in: TWO MONTHS with Dr. Graciela HusbandsKlein   Any Other Special Instructions Will Be Listed Below (If Applicable).

## 2015-04-18 NOTE — Telephone Encounter (Signed)
Pt seen today, 5/24, by Dr. Graciela HusbandsKlein

## 2015-04-18 NOTE — Progress Notes (Signed)
,      Patient Care Team: Bartholomew Boards as PCP - General (Physician Assistant)   HPI  Leah Hartman is a 19 y.o. 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 These occurred in the wake of a mononucleosis infection. She has seen ID in the interim; they apparently had no great insights.  She finds that she is putting on weight. This is very disruptive to her self image. She contnues vomiting at almost every meal. Her mother confirms this   She is deplete of salt and water in her diet. She was unable to take salt supplementation  because of GI irritation.  She has appt with [facility]  Dr Lavona Mound 7/6  She is now working in a caf. She is struggling with intermittent episodes of lightheadedness and presyncope and nonresponsiveness.  She is struggling with some depression. Her mom acknowledges this and is in tears. Benjamin describes the change in her life over the last year and somewhat understated terms compared to how her mother does.   Mononucleosis apparently is reactivating.       Past Medical History  Diagnosis Date  . Syncope and collapse   . Ovarian cyst   . Ovarian cyst rupture   . Nausea & vomiting   . Abdominal pain   . GERD (gastroesophageal reflux disease)     heartburn     Past Surgical History  Procedure Laterality Date  . Tonsillectomy and adenoidectomy    . Appendectomy    . Tympanostomy tube placement    . Ovarian cyst surgery Bilateral   . Esophagogastroduodenoscopy N/A 04/07/2015    Procedure: ESOPHAGOGASTRODUODENOSCOPY (EGD);  Surgeon: Midge Minium, MD;  Location: [facility];  Service: Gastroenterology;  Laterality: N/A;    Current Outpatient Prescriptions  Medication Sig Dispense Refill  . norethindrone-ethinyl estradiol-iron (MICROGESTIN FE,GILDESS FE,LOESTRIN FE) 1.5-30 MG-MCG tablet Take 1 tablet by mouth daily. PM     No current facility-administered medications for this visit.    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 We discussed again extensively the issues of dysautonomia, the physiology of orthstasis and positional stress.  We discussed the role of salt and water repletion, the importance of exercise, often needing to be started in the recumbent position, and the awareness of triggers and the role of ambient heat and dehydration  She will begin working on increasing her fluid intake with Gatorade. She may not tolerate her job which has her walking around, at least not full-time.  We did a little bit of literature search will she was here regarding chronic mononucleosis. It is interesting to note that 7% of patients the criteria for chronic fatigue at 12 months post infection and that this is more likely in no women especially those with mood disorders.  I have given her the websites for POTS place and [email or web address].  I suggested she discuss with her PCP antidepressive therapy with an SSRI

## 2015-04-22 ENCOUNTER — Encounter: Payer: Self-pay | Admitting: Emergency Medicine

## 2015-04-22 DIAGNOSIS — S060X0A Concussion without loss of consciousness, initial encounter: Secondary | ICD-10-CM | POA: Diagnosis not present

## 2015-04-22 DIAGNOSIS — Y9301 Activity, walking, marching and hiking: Secondary | ICD-10-CM | POA: Diagnosis not present

## 2015-04-22 DIAGNOSIS — Z3202 Encounter for pregnancy test, result negative: Secondary | ICD-10-CM | POA: Diagnosis not present

## 2015-04-22 DIAGNOSIS — Y9289 Other specified places as the place of occurrence of the external cause: Secondary | ICD-10-CM | POA: Diagnosis not present

## 2015-04-22 DIAGNOSIS — W1839XA Other fall on same level, initial encounter: Secondary | ICD-10-CM | POA: Insufficient documentation

## 2015-04-22 DIAGNOSIS — R55 Syncope and collapse: Secondary | ICD-10-CM | POA: Insufficient documentation

## 2015-04-22 DIAGNOSIS — Y998 Other external cause status: Secondary | ICD-10-CM | POA: Diagnosis not present

## 2015-04-22 DIAGNOSIS — S0081XA Abrasion of other part of head, initial encounter: Secondary | ICD-10-CM | POA: Insufficient documentation

## 2015-04-22 NOTE — ED Notes (Addendum)
Pt says she was walking outside and had a syncopal episode; recently diagnosed with POTS Syndrome; pt was outside when she became aware of her surroundings; was able to walk inside; mom says once she got inside she began vomiting; hematoma to right side of her forehead; ringing in both ears; pt currently awake and alert; c/o headache

## 2015-04-23 ENCOUNTER — Emergency Department
Admission: EM | Admit: 2015-04-23 | Discharge: 2015-04-23 | Disposition: A | Discharge status: Home / Self Care | Payer: PRIVATE HEALTH INSURANCE | Attending: Emergency Medicine | Admitting: Emergency Medicine

## 2015-04-23 ENCOUNTER — Other Ambulatory Visit: Payer: Self-pay

## 2015-04-23 ENCOUNTER — Emergency Department: Payer: PRIVATE HEALTH INSURANCE

## 2015-04-23 DIAGNOSIS — R55 Syncope and collapse: Secondary | ICD-10-CM

## 2015-04-23 DIAGNOSIS — S060X0A Concussion without loss of consciousness, initial encounter: Secondary | ICD-10-CM

## 2015-04-23 LAB — BASIC METABOLIC PANEL
Anion gap: 8 (ref 5–15)
BUN: 15 mg/dL (ref 6–20)
CO2: 25 mmol/L (ref 22–32)
Calcium: 8.9 mg/dL (ref 8.9–10.3)
Chloride: 108 mmol/L (ref 101–111)
Creatinine, Ser: 0.61 mg/dL (ref 0.44–1.00)
GFR calc Af Amer: >60 mL/min (ref >60)
GFR calc non Af Amer: >60 mL/min (ref >60)
Glucose, Bld: 85 mg/dL (ref 65–99)
Potassium: 3.7 mmol/L (ref 3.5–5.1)
SODIUM: 141 mmol/L (ref 135–145)

## 2015-04-23 LAB — POCT PREGNANCY, URINE: Preg Test, Ur: NEGATIVE

## 2015-04-23 LAB — HEMOGLOBIN AND HEMATOCRIT, BLOOD
HEMATOCRIT: 39.2 % (ref 35.0–47.0)
Hemoglobin: 12.9 g/dL (ref 12.0–16.0)

## 2015-04-23 MED ORDER — IBUPROFEN 600 MG PO TABS
ORAL_TABLET | ORAL | Status: AC
  Filled 2015-04-23: qty 1

## 2015-04-23 MED ORDER — IBUPROFEN 600 MG PO TABS
600.0000 mg | ORAL_TABLET | Freq: Once | ORAL | Status: AC
  Administered 2015-04-23: 600 mg via ORAL

## 2015-04-23 MED ORDER — SODIUM CHLORIDE 0.9 % IV SOLN
Freq: Once | INTRAVENOUS | Status: AC
  Administered 2015-04-23: via INTRAVENOUS

## 2015-04-23 MED ORDER — ACETAMINOPHEN 325 MG PO TABS
650.0000 mg | ORAL_TABLET | Freq: Once | ORAL | Status: AC
  Administered 2015-04-23: 650 mg via ORAL

## 2015-04-23 MED ORDER — ACETAMINOPHEN 325 MG PO TABS
ORAL_TABLET | ORAL | Status: AC
  Administered 2015-04-23: 650 mg via ORAL
  Filled 2015-04-23: qty 2

## 2015-04-23 NOTE — Discharge Instructions (Signed)
Concussion °A concussion is a brain injury. It is caused by: °· A hit to the head. °· A quick and sudden movement (jolt) of the head or neck. °A concussion is usually not life threatening. Even so, it can cause serious problems. If you had a concussion before, you may have concussion-like problems after a hit to your head. °HOME CARE °General Instructions °· Follow your doctor's directions carefully. °· Take medicines only as told by your doctor. °· Only take medicines your doctor says are safe. °· Do not drink alcohol until your doctor says it is okay. Alcohol and some drugs can slow down healing. They can also put you at risk for further injury. °· If you are having trouble remembering things, write them down. °· Try to do one thing at a time if you get distracted easily. For example, do not watch TV while making dinner. °· Talk to your family members or close friends when making important decisions. °· Follow up with your doctor as told. °· Watch your symptoms. Tell others to do the same. Serious problems can sometimes happen after a concussion. Older adults are more likely to have these problems. °· Tell your teachers, school nurse, school counselor, coach, athletic trainer, or work manager about your concussion. Tell them about what you can or cannot do. They should watch to see if: °¨ It gets even harder for you to pay attention or concentrate. °¨ It gets even harder for you to remember things or learn new things. °¨ You need more time than normal to finish things. °¨ You become annoyed (irritable) more than before. °¨ You are not able to deal with stress as well. °¨ You have more problems than before. °· Rest. Make sure you: °¨ Get plenty of sleep at night. °¨ Go to sleep early. °¨ Go to bed at the same time every day. Try to wake up at the same time. °¨ Rest during the day. °¨ Take naps when you feel tired. °· Limit activities where you have to think a lot or concentrate. These include: °¨ Doing  homework. °¨ Doing work related to a job. °¨ Watching TV. °¨ Using the computer. °Returning To Your Regular Activities °Return to your normal activities slowly, not all at once. You must give your body and brain enough time to heal.  °· Do not play sports or do other athletic activities until your doctor says it is okay. °· Ask your doctor when you can drive, ride a bicycle, or work other vehicles or machines. Never do these things if you feel dizzy. °· Ask your doctor about when you can return to work or school. °Preventing Another Concussion °It is very important to avoid another brain injury, especially before you have healed. In rare cases, another injury can lead to permanent brain damage, brain swelling, or death. The risk of this is greatest during the first 7-10 days after your injury. Avoid injuries by:  °· Wearing a seat belt when riding in a car. °· Not drinking too much alcohol. °· Avoiding activities that could lead to a second concussion (such as contact sports). °· Wearing a helmet when doing activities like: °¨ Biking. °¨ Skiing. °¨ Skateboarding. °¨ Skating. °· Making your home safer by: °¨ Removing things from the floor or stairways that could make you trip. °¨ Using grab bars in bathrooms and handrails by stairs. °¨ Placing non-slip mats on floors and in bathtubs. °¨ Improve lighting in dark areas. °GET HELP IF: °· It   gets even harder for you to pay attention or concentrate. °· It gets even harder for you to remember things or learn new things. °· You need more time than normal to finish things. °· You become annoyed (irritable) more than before. °· You are not able to deal with stress as well. °· You have more problems than before. °· You have problems keeping your balance. °· You are not able to react quickly when you should. °Get help if you have any of these problems for more than 2 weeks:  °· Lasting (chronic) headaches. °· Dizziness or trouble balancing. °· Feeling sick to your stomach  (nausea). °· Seeing (vision) problems. °· Being affected by noises or light more than normal. °· Feeling sad, low, down in the dumps, blue, gloomy, or empty (depressed). °· Mood changes (mood swings). °· Feeling of fear or nervousness about what may happen (anxiety). °· Feeling annoyed. °· Memory problems. °· Problems concentrating or paying attention. °· Sleep problems. °· Feeling tired all the time. °GET HELP RIGHT AWAY IF:  °· You have bad headaches or your headaches get worse. °· You have weakness (even if it is in one hand, leg, or part of the face). °· You have loss of feeling (numbness). °· You feel off balance. °· You keep throwing up (vomiting). °· You feel tired. °· One black center of your eye (pupil) is larger than the other. °· You twitch or shake violently (convulse). °· Your speech is not clear (slurred). °· You are more confused, easily angered (agitated), or annoyed than before. °· You have more trouble resting than before. °· You are unable to recognize people or places. °· You have neck pain. °· It is difficult to wake you up. °· You have unusual behavior changes. °· You pass out (lose consciousness). °MAKE SURE YOU:  °· Understand these instructions. °· Will watch your condition. °· Will get help right away if you are not doing well or get worse. °Document Released: 10/30/2009 Document Revised: 03/28/2014 Document Reviewed: 06/03/2013 °ExitCare® Patient Information ©2015 [employer]. This information is not intended to replace advice given to you by your health care provider. Make sure you discuss any questions you have with your health care provider. ° °

## 2015-04-23 NOTE — ED Provider Notes (Signed)
[facility] Emergency Department Provider Note  ____________________________________________  Time seen: Approximately 3 AM  I have reviewed the triage vital signs and the nursing notes.   HISTORY  Chief Complaint Loss of Consciousness    HPI Leah Hartman is a 19 y.o. female with a history of pots syndrome who presents to the emergency department with a syncopal episode and headache. She says she was walking out of her home when she passed out. She's had multiple episodes similar to this in the past secondary to her pots syndrome. She had no preceding symptoms which is typical for her. She said that when she woke up that she had vomit in her hair and has had a diffuse headache since. She is also complaining of some bilateral posterior neck stiffness. She has no nausea at this time. Mild to moderate diffuse headache. No dizziness. No chest pain or shortness of breath.   Past Medical History  Diagnosis Date  . Syncope and collapse   . Ovarian cyst   . Ovarian cyst rupture   . Nausea & vomiting   . Abdominal pain   . GERD (gastroesophageal reflux disease)     heartburn     Patient Active Problem List   Diagnosis Date Noted  . Syncope 01/31/2015  . Chronic fatigue 01/31/2015  . Nausea and vomiting 01/31/2015  . SOB (shortness of breath) 09/27/2014  . PSVT (paroxysmal supraventricular tachycardia) 09/27/2014    Past Surgical History  Procedure Laterality Date  . Tonsillectomy and adenoidectomy    . Appendectomy    . Tympanostomy tube placement    . Ovarian cyst surgery Bilateral   . Esophagogastroduodenoscopy N/A 04/07/2015    Procedure: ESOPHAGOGASTRODUODENOSCOPY (EGD);  Surgeon: Midge Minium, MD;  Location: [facility];  Service: Gastroenterology;  Laterality: N/A;    Current Outpatient Rx  Name  Route  Sig  Dispense  Refill  . norethindrone-ethinyl estradiol-iron (MICROGESTIN FE,GILDESS FE,LOESTRIN FE) 1.5-30 MG-MCG tablet   Oral   Take 1 tablet by mouth daily. PM           Allergies Review of patient's allergies indicates no known allergies.  Family History  Problem Relation Age of Onset  . Hypertension Mother     Social History History  Substance Use Topics  . Smoking status: Never Smoker   . Smokeless tobacco: Not on file  . Alcohol Use: No    Review of Systems Constitutional: No fever/chills Eyes: No visual changes. ENT: No sore throat. Cardiovascular: Denies chest pain. Respiratory: Denies shortness of breath. Gastrointestinal: No abdominal pain.  No nausea, no vomiting.  No diarrhea.  No constipation. Genitourinary: Negative for dysuria. Musculoskeletal: Negative for back pain. Skin: Negative for rash. Neurological:diffuse mild to moderate headache worse in the front. 10-point ROS otherwise negative.  ____________________________________________   PHYSICAL EXAM:  VITAL SIGNS: ED Triage Vitals  Enc Vitals Group     BP 04/22/15 2351 114/63 mmHg     Pulse Rate 04/22/15 2351 97     Resp 04/22/15 2351 18     Temp 04/22/15 2351 98.7 F (37.1 C)     Temp Source 04/22/15 2351 Oral     SpO2 04/22/15 2351 98 %     Weight 04/22/15 2351 162 lb (73.483 kg)     Height 04/22/15 2351  (1.753 m)     Head Cir --      Peak Flow --      Pain Score 04/22/15 2353 9     Pain  Loc --      Pain Edu? --      Excl. in GC? --     Constitutional: Alert and oriented. Well appearing and in no acute distress. Eyes: Conjunctivae are normal. PERRL. EOMI. Head:abrasion to the right forehead. Tympanic membranes intact bilaterally without any hemotympanum. There is no blood in the ears bilaterally. Nose: No congestion/rhinnorhea. Mouth/Throat: Mucous membranes are moist.  Oropharynx non-erythematous. Neck: No stridor.  Tenderness bilateral to the trapezius muscles. There is no midline tenderness. There is no neurologic deficit. Cardiovascular: Normal rate, regular rhythm. Grossly normal heart sounds.   Good peripheral circulation. Respiratory: Normal respiratory effort.  No retractions. Lungs CTAB. Gastrointestinal: Soft and nontender. No distention. No abdominal bruits. No CVA tenderness. Musculoskeletal: No lower extremity tenderness nor edema.  No joint effusions. Neurologic:  Normal speech and language. No gross focal neurologic deficits are appreciated. Speech is normal. No gait instability. Skin:  Skin is warm, dry and intact. No rash noted. Psychiatric: Mood and affect are normal. Speech and behavior are normal.  ____________________________________________   LABS (all labs ordered are listed, but only abnormal results are displayed)  Labs Reviewed  BASIC METABOLIC PANEL  HEMOGLOBIN AND HEMATOCRIT, BLOOD  POC URINE PREG, ED  POCT PREGNANCY, URINE   ____________________________________________  EKG  ED ECG REPORT I, Schaevitz,  Teena Iraniavid M, the attending physician, personally viewed and interpreted this ECG.   Date: 04/23/2015  EKG Time: 305  Rate: 97  Rhythm: normal EKG, normal sinus rhythm  Axis: normal  Intervals:none  ST&T Change: no ST elevations or depressions. No abnormal T-wave inversions.  ____________________________________________  RADIOLOGY  No acute findings on CAT scan of the brain ____________________________________________   PROCEDURES    ____________________________________________   INITIAL IMPRESSION / ASSESSMENT AND PLAN / ED COURSE  Pertinent labs & imaging results that were available during my care of the patient were reviewed by me and considered in my medical decision making (see chart for details).  ----------------------------------------- 4:44 AM on 04/23/2015 -----------------------------------------  Patient with persistent headache despite Tylenol.  We'll give ibuprofen. Likely concussed. Patient does not play any contact sports. Advised about importance of rest as well as decreased screen time and phone timeand no  drinking. We'll give work note for today. Will follow up with primary care doctor.   ________________________________________   FINAL CLINICAL IMPRESSION(S) / ED DIAGNOSES  Acute syncope. Acute concussion. Initial visit.    Myrna Blazeravid Matthew Schaevitz, MD 04/23/15 [phone]

## 2015-04-25 ENCOUNTER — Other Ambulatory Visit: Payer: Self-pay | Admitting: Internal Medicine

## 2015-04-25 ENCOUNTER — Telehealth: Payer: Self-pay | Admitting: Internal Medicine

## 2015-04-25 NOTE — Telephone Encounter (Signed)
Notified mother that request for work note to cut hours to part-time will be forwarded to Dr. Graciela HusbandsKlein.  Pt mother states patient passed out at home Saturday and hit her head. Was seen in the ER and per mother, was diagnosed with concussion.

## 2015-04-25 NOTE — Telephone Encounter (Signed)
Patients mom at [facility] with specimen and they have no order.  Faxed signed order for 24hr sodium to [phone].  Mother aware and will call back if further assistance needed.

## 2015-04-25 NOTE — Telephone Encounter (Signed)
Patient needs work note from Dr. Graciela HusbandsKlein as previously discussed to be able to cut hours to part time.  Please call when ready.

## 2015-04-27 LAB — SODIUM, URINE, 24 HOUR: SODIUM UR: 201 mmol/L

## 2015-04-27 LAB — SPECIMEN STATUS REPORT

## 2015-04-27 NOTE — Telephone Encounter (Signed)
Pt mother calling asking if she can try to get the letter by tmrw.  Please call when ready.

## 2015-04-27 NOTE — Telephone Encounter (Signed)
Left message on machine for patient to contact the office.   

## 2015-04-28 ENCOUNTER — Encounter: Payer: Self-pay | Admitting: Internal Medicine

## 2015-04-28 ENCOUNTER — Other Ambulatory Visit: Payer: Self-pay

## 2015-04-28 ENCOUNTER — Ambulatory Visit: Payer: No Typology Code available for payment source

## 2015-04-28 DIAGNOSIS — N946 Dysmenorrhea, unspecified: Secondary | ICD-10-CM

## 2015-05-02 ENCOUNTER — Encounter: Payer: Self-pay | Admitting: *Deleted

## 2015-05-02 ENCOUNTER — Telehealth: Payer: Self-pay

## 2015-05-02 ENCOUNTER — Ambulatory Visit: Payer: No Typology Code available for payment source | Admitting: Internal Medicine

## 2015-05-02 NOTE — Telephone Encounter (Signed)
Pt called to leave this # to let her know when her letter for work is ready. States this is her mom's Cell #, ok to leave msg on this #.

## 2015-05-02 NOTE — Telephone Encounter (Signed)
I left a message at the # left today that the patient's letter is ready for pick up.  Dr. Graciela HusbandsKlein also wanted to know if her 24 urine study had been done- I asked she let us know about the status of this.

## 2015-05-02 NOTE — Telephone Encounter (Signed)
Mother came by and yes, patient had the 6224 urine study at [facility] on 04/25/15 on [address] [place].

## 2015-05-02 NOTE — Telephone Encounter (Signed)
She 05/02/15 phone encounter.

## 2015-05-10 ENCOUNTER — Ambulatory Visit (INDEPENDENT_AMBULATORY_CARE_PROVIDER_SITE_OTHER): Payer: No Typology Code available for payment source | Admitting: Obstetrics and Gynecology

## 2015-05-10 ENCOUNTER — Encounter: Payer: Self-pay | Admitting: Obstetrics and Gynecology

## 2015-05-10 ENCOUNTER — Encounter
Admission: RE | Admit: 2015-05-10 | Discharge: 2015-05-10 | Disposition: A | Discharge status: Home / Self Care | Payer: PRIVATE HEALTH INSURANCE | Source: Ambulatory Visit | Attending: Obstetrics and Gynecology | Admitting: Obstetrics and Gynecology

## 2015-05-10 VITALS — BP 114/73 | HR 79 | Ht 69.0 in | Wt 166.5 lb

## 2015-05-10 DIAGNOSIS — Z01818 Encounter for other preprocedural examination: Secondary | ICD-10-CM

## 2015-05-10 DIAGNOSIS — N920 Excessive and frequent menstruation with regular cycle: Secondary | ICD-10-CM | POA: Diagnosis not present

## 2015-05-10 DIAGNOSIS — N949 Unspecified condition associated with female genital organs and menstrual cycle: Secondary | ICD-10-CM

## 2015-05-10 DIAGNOSIS — R102 Pelvic and perineal pain: Secondary | ICD-10-CM | POA: Insufficient documentation

## 2015-05-10 DIAGNOSIS — N946 Dysmenorrhea, unspecified: Secondary | ICD-10-CM | POA: Insufficient documentation

## 2015-05-10 DIAGNOSIS — Z842 Family history of other diseases of the genitourinary system: Secondary | ICD-10-CM

## 2015-05-10 DIAGNOSIS — Z01812 Encounter for preprocedural laboratory examination: Secondary | ICD-10-CM | POA: Insufficient documentation

## 2015-05-10 DIAGNOSIS — G8929 Other chronic pain: Secondary | ICD-10-CM

## 2015-05-10 HISTORY — DX: Postural orthostatic tachycardia syndrome (POTS): G90.A

## 2015-05-10 HISTORY — DX: Dysmenorrhea, unspecified: N94.6

## 2015-05-10 HISTORY — DX: Other specified cardiac arrhythmias: I49.8

## 2015-05-10 HISTORY — DX: Orthostatic hypotension: I95.1

## 2015-05-10 HISTORY — DX: Tachycardia, unspecified: R00.0

## 2015-05-10 LAB — RAPID HIV SCREEN (HIV 1/2 AB+AG)
HIV 1/2 ANTIBODIES: NONREACTIVE
HIV-1 P24 ANTIGEN - HIV24: NONREACTIVE

## 2015-05-10 LAB — BASIC METABOLIC PANEL
ANION GAP: 6 (ref 5–15)
BUN: 10 mg/dL (ref 6–20)
CO2: 27 mmol/L (ref 22–32)
Calcium: 9.1 mg/dL (ref 8.9–10.3)
Chloride: 106 mmol/L (ref 101–111)
Creatinine, Ser: 0.68 mg/dL (ref 0.44–1.00)
GFR calc Af Amer: >60 mL/min (ref >60)
GFR calc non Af Amer: >60 mL/min (ref >60)
Glucose, Bld: 84 mg/dL (ref 65–99)
Potassium: 4 mmol/L (ref 3.5–5.1)
SODIUM: 139 mmol/L (ref 135–145)

## 2015-05-10 LAB — CBC WITH DIFFERENTIAL/PLATELET
BASOS PCT: 0 %
Basophils Absolute: 0 10*3/uL (ref 0–0.1)
Eosinophils Absolute: 0.1 10*3/uL (ref 0–0.7)
Eosinophils Relative: 2 %
HEMATOCRIT: 40.6 % (ref 35.0–47.0)
Hemoglobin: 13.2 g/dL (ref 12.0–16.0)
LYMPHS PCT: 28 %
Lymphs Abs: 1.6 10*3/uL (ref 1.0–3.6)
MCH: 29.5 pg (ref 26.0–34.0)
MCHC: 32.6 g/dL (ref 32.0–36.0)
MCV: 90.8 fL (ref 80.0–100.0)
MONO ABS: 0.6 10*3/uL (ref 0.2–0.9)
Monocytes Relative: 10 %
NEUTROS ABS: 3.3 10*3/uL (ref 1.4–6.5)
Neutrophils Relative %: 60 %
Platelets: 168 10*3/uL (ref 150–440)
RBC: 4.47 MIL/uL (ref 3.80–5.20)
RDW: 14.3 % (ref 11.5–14.5)
WBC: 5.6 10*3/uL (ref 3.6–11.0)

## 2015-05-10 NOTE — OR Nursing (Signed)
POTS syndrome  Ekg 04/24/15 and office visit by Dr Odessa Fleming in epic under chart review

## 2015-05-10 NOTE — Patient Instructions (Signed)
  Your procedure is scheduled on: 05/15/15 Report to Day Surgery. To find out your arrival time please call [phone] between 1PM - 3PM on 05/12/15.  Remember: Instructions that are not followed completely may result in serious medical risk, up to and including death, or upon the discretion of your surgeon and anesthesiologist your surgery may need to be rescheduled.    _x_ 1. Do not eat food or drink liquids after midnight. No gum chewing or hard candies.     __x__ 2. No Alcohol for 24 hours before or after surgery.   ____ 3. Bring all medications with you on the day of surgery if instructed.    _x___ 4. Notify your doctor if there is any change in your medical condition     (cold, fever, infections).     Do not wear jewelry, make-up, hairpins, clips or nail polish.  Do not wear lotions, powders, or perfumes. You may wear deodorant.  Do not shave 48 hours prior to surgery. Men may shave face and neck.  Do not bring valuables to the hospital.    [facility] is not responsible for any belongings or valuables.               Contacts, dentures or bridgework may not be worn into surgery.  Leave your suitcase in the car. After surgery it may be brought to your room.  For patients admitted to the hospital, discharge time is determined by your                treatment team.   Patients discharged the day of surgery will not be allowed to drive home.   Please read over the following fact sheets that you were given:   Surgical Site Infection Prevention   ____ Take these medicines the morning of surgery with A SIP OF WATER:    1.   2.   3.   4.  5.  6.  ____ Fleet Enema (as directed)   ____ Use CHG Soap as directed  ____ Use inhalers on the day of surgery  ____ Stop metformin 2 days prior to surgery    ____ Take 1/2 of usual insulin dose the night before surgery and none on the morning of surgery.   ____ Stop Coumadin/Plavix/aspirin on   ____ Stop Anti-inflammatories  on   ____ Stop supplements until after surgery.    ____ Bring C-Pap to the hospital.  

## 2015-05-10 NOTE — Progress Notes (Signed)
Patient ID: Leah Hartman, female   DOB: Aug 17, 1996, 19 y.o.   MRN: [id number] Pt. Presents for pre-op and IUD insertion.  Pt. Has questions about IUD.  Subjective:    Patient is a 19 y.o. No obstetric history on file.female scheduled for Laparoscopy with peritoneal biopsies for assessment of chronic pelvic pain, and IUD insertion. Indications for procedure are Family history of endometriosis, chronic pelvic pain, worsening, menorrhagia..  Patient has been taking continuous oral contraceptives without regulation of abnormal uterine bleeding.  Cycles are still heavy, lasting 4 days, requiring extra super tampons for control.  OTC medications are not effective in controlling cramps. Both mother and grandmother have history of endometriosis.   Pertinent Gynecological History: Menses: flow is excessive with use of . pads or tampons on heaviest days and with severe dysmenorrhea Contraception: OCP (estrogen/progesterone)  Discussed Blood/Blood Products: no   Menstrual History: OB History    No data available      Patient's last menstrual period was 04/15/2015 (exact date).    Past Medical History  Diagnosis Date  . Syncope and collapse   . Ovarian cyst   . Ovarian cyst rupture   . Nausea & vomiting   . Abdominal pain   . GERD (gastroesophageal reflux disease)     heartburn   . Dysmenorrhea 05/10/2015    Past Surgical History  Procedure Laterality Date  . Tonsillectomy and adenoidectomy    . Appendectomy    . Tympanostomy tube placement    . Ovarian cyst surgery Bilateral   . Esophagogastroduodenoscopy N/A 04/07/2015    Procedure: ESOPHAGOGASTRODUODENOSCOPY (EGD);  Surgeon: Midge Minium, MD;  Location: [facility];  Service: Gastroenterology;  Laterality: N/A;    OB History  No data available    History   Social History  . Marital Status: Single    Spouse Name: N/A  . Number of Children: N/A  . Years of Education: N/A   Occupational History  . [employer]    Social History Main Topics  . Smoking status: Never Smoker   . Smokeless tobacco: Never Used  . Alcohol Use: No  . Drug Use: No  . Sexual Activity: Not Currently   Other Topics Concern  . None   Social History Narrative    Family History  Problem Relation Age of Onset  . Hypertension Mother      (Not in a hospital admission)  No Known Allergies  Review of Systems Constitutional: No recent fever/chills/sweats Respiratory: No recent cough/bronchitis Cardiovascular: No chest pain Gastrointestinal: No recent nausea/vomiting/diarrhea Genitourinary: No UTI symptoms Hematologic/lymphatic:No history of coagulopathy or recent blood thinner use    Objective:    BP 114/73 mmHg  Pulse 79  Ht  (1.753 m)  Wt 166 lb 8 oz (75.524 kg)  BMI 24.58 kg/m2  LMP 04/15/2015 (Exact Date)  General:   Normal  Skin:   normal  HEENT:  Normal; Normal dentition  Neck:  Supple without Adenopathy or Thyromegaly  Lungs:   Heart:              Breasts:   Abdomen:  Pelvis:  M/S   Extremeties:  Neuro:    clear to auscultation bilaterally   Normal without murmur   Not Examined   soft, non-tender; bowel sounds normal; no masses,  no organomegaly   Exam deferred to OR  No CVAT  Warm/Dry   Normal          Assessment:    1.  Chronic pelvic  pain, worsening. 2.  Severe dysmenorrhea. 3.  Menorrhagia. 4.  Family history of endometriosis   Plan:   1.  Laparoscopy with peritoneal biopsies, excision and fulguration of endometriosis. 2.  Mirena IUD insertion.  PRE-OPERATIVE COUNSELING: The patient is to undergo laparoscopy with peritoneal biopsies, excision and fulguration of endometriosis along with Mirena IUD insertion.  She is understanding of the planned procedures and is aware of and is accepting of all surgical risks which include but are not limited to bleeding, infection, pelvic organ injury, need for repair, uterine perforation, blood clots disorders,  anesthesia risks, etc.  All questions have been answered.  Informed consent is given.  She is ready and willing to proceed with surgery as scheduled.

## 2015-05-11 LAB — RPR: RPR: NONREACTIVE

## 2015-05-11 NOTE — OR Nursing (Signed)
Chart returned following anesthesia review, OK to proceed per Dr. Randel Pigg

## 2015-05-15 ENCOUNTER — Ambulatory Visit
Admission: RE | Admit: 2015-05-15 | Discharge: 2015-05-15 | Disposition: A | Discharge status: Home / Self Care | Payer: PRIVATE HEALTH INSURANCE | Source: Ambulatory Visit | Attending: Obstetrics and Gynecology | Admitting: Obstetrics and Gynecology

## 2015-05-15 ENCOUNTER — Ambulatory Visit: Payer: PRIVATE HEALTH INSURANCE | Admitting: Certified Registered"

## 2015-05-15 ENCOUNTER — Encounter
Admission: RE | Disposition: A | Discharge status: Home / Self Care | Payer: Self-pay | Source: Ambulatory Visit | Attending: Obstetrics and Gynecology

## 2015-05-15 ENCOUNTER — Encounter: Payer: Self-pay | Admitting: *Deleted

## 2015-05-15 DIAGNOSIS — N803 Endometriosis of pelvic peritoneum: Secondary | ICD-10-CM | POA: Diagnosis not present

## 2015-05-15 DIAGNOSIS — G8929 Other chronic pain: Secondary | ICD-10-CM | POA: Diagnosis present

## 2015-05-15 DIAGNOSIS — Z8489 Family history of other specified conditions: Secondary | ICD-10-CM | POA: Diagnosis not present

## 2015-05-15 DIAGNOSIS — Z30430 Encounter for insertion of intrauterine contraceptive device: Secondary | ICD-10-CM | POA: Diagnosis not present

## 2015-05-15 DIAGNOSIS — N801 Endometriosis of ovary: Secondary | ICD-10-CM | POA: Diagnosis not present

## 2015-05-15 DIAGNOSIS — N920 Excessive and frequent menstruation with regular cycle: Secondary | ICD-10-CM | POA: Diagnosis not present

## 2015-05-15 DIAGNOSIS — R102 Pelvic and perineal pain: Secondary | ICD-10-CM | POA: Insufficient documentation

## 2015-05-15 DIAGNOSIS — N809 Endometriosis, unspecified: Secondary | ICD-10-CM

## 2015-05-15 DIAGNOSIS — Z9049 Acquired absence of other specified parts of digestive tract: Secondary | ICD-10-CM | POA: Diagnosis not present

## 2015-05-15 HISTORY — PX: INTRAUTERINE DEVICE (IUD) INSERTION: SHX5877

## 2015-05-15 HISTORY — PX: LAPAROSCOPY: SHX197

## 2015-05-15 LAB — POCT PREGNANCY, URINE: PREG TEST UR: NEGATIVE

## 2015-05-15 SURGERY — LAPAROSCOPY OPERATIVE
Anesthesia: General | Wound class: Clean Contaminated

## 2015-05-15 MED ORDER — HYDROMORPHONE HCL 1 MG/ML IJ SOLN
INTRAMUSCULAR | Status: AC
  Administered 2015-05-15: 0.5 mg via INTRAVENOUS
  Filled 2015-05-15: qty 1

## 2015-05-15 MED ORDER — LACTATED RINGERS IV SOLN: INTRAVENOUS | Status: DC

## 2015-05-15 MED ORDER — LIDOCAINE HCL (CARDIAC) 20 MG/ML IV SOLN
INTRAVENOUS | Status: DC | PRN
  Administered 2015-05-15: 50 mg via INTRAVENOUS

## 2015-05-15 MED ORDER — PROPOFOL 10 MG/ML IV BOLUS
INTRAVENOUS | Status: DC | PRN
  Administered 2015-05-15: 200 mg via INTRAVENOUS

## 2015-05-15 MED ORDER — BUPIVACAINE-EPINEPHRINE (PF) 0.5% -1:200000 IJ SOLN
INTRAMUSCULAR | Status: AC
  Filled 2015-05-15: qty 30

## 2015-05-15 MED ORDER — LACTATED RINGERS IV SOLN
INTRAVENOUS | Status: DC
  Administered 2015-05-15: 12:00:00 via INTRAVENOUS

## 2015-05-15 MED ORDER — FENTANYL CITRATE (PF) 100 MCG/2ML IJ SOLN
INTRAMUSCULAR | Status: AC
  Administered 2015-05-15: 25 ug via INTRAVENOUS
  Filled 2015-05-15: qty 2

## 2015-05-15 MED ORDER — OXYCODONE-ACETAMINOPHEN 5-325 MG PO TABS: 1.0000 | ORAL_TABLET | ORAL | Status: DC | PRN

## 2015-05-15 MED ORDER — FENTANYL CITRATE (PF) 100 MCG/2ML IJ SOLN
INTRAMUSCULAR | Status: DC | PRN
  Administered 2015-05-15: 50 ug via INTRAVENOUS
  Administered 2015-05-15: 150 ug via INTRAVENOUS

## 2015-05-15 MED ORDER — ONDANSETRON HCL 4 MG/2ML IJ SOLN
INTRAMUSCULAR | Status: DC | PRN
  Administered 2015-05-15: 4 mg via INTRAVENOUS

## 2015-05-15 MED ORDER — NEOSTIGMINE METHYLSULFATE 10 MG/10ML IV SOLN
INTRAVENOUS | Status: DC | PRN
  Administered 2015-05-15: 3 mg via INTRAVENOUS

## 2015-05-15 MED ORDER — FAMOTIDINE 20 MG PO TABS
20.0000 mg | ORAL_TABLET | Freq: Once | ORAL | Status: AC
  Administered 2015-05-15: 20 mg via ORAL

## 2015-05-15 MED ORDER — DEXAMETHASONE SODIUM PHOSPHATE 4 MG/ML IJ SOLN
INTRAMUSCULAR | Status: DC | PRN
  Administered 2015-05-15: 5 mg via INTRAVENOUS

## 2015-05-15 MED ORDER — PHENYLEPHRINE HCL 10 MG/ML IJ SOLN
INTRAMUSCULAR | Status: DC | PRN
  Administered 2015-05-15 (×4): 100 ug via INTRAVENOUS

## 2015-05-15 MED ORDER — IBUPROFEN 600 MG PO TABS: 600.0000 mg | ORAL_TABLET | Freq: Four times a day (QID) | ORAL | Status: DC | PRN

## 2015-05-15 MED ORDER — FAMOTIDINE 20 MG PO TABS
ORAL_TABLET | ORAL | Status: AC
  Administered 2015-05-15: 20 mg via ORAL
  Filled 2015-05-15: qty 1

## 2015-05-15 MED ORDER — GLYCOPYRROLATE 0.2 MG/ML IJ SOLN
INTRAMUSCULAR | Status: DC | PRN
  Administered 2015-05-15: 0.6 mg via INTRAVENOUS

## 2015-05-15 MED ORDER — FENTANYL CITRATE (PF) 100 MCG/2ML IJ SOLN
25.0000 ug | INTRAMUSCULAR | Status: DC | PRN
  Administered 2015-05-15 (×4): 25 ug via INTRAVENOUS

## 2015-05-15 MED ORDER — EPHEDRINE SULFATE 50 MG/ML IJ SOLN
INTRAMUSCULAR | Status: DC | PRN
  Administered 2015-05-15: 10 mg via INTRAVENOUS

## 2015-05-15 MED ORDER — HYDROMORPHONE HCL 1 MG/ML IJ SOLN
0.2500 mg | INTRAMUSCULAR | Status: DC | PRN
  Administered 2015-05-15 (×2): 0.5 mg via INTRAVENOUS

## 2015-05-15 MED ORDER — ONDANSETRON HCL 4 MG/2ML IJ SOLN: 4.0000 mg | Freq: Once | INTRAMUSCULAR | Status: DC | PRN

## 2015-05-15 MED ORDER — MIDAZOLAM HCL 2 MG/2ML IJ SOLN
INTRAMUSCULAR | Status: DC | PRN
  Administered 2015-05-15: 2 mg via INTRAVENOUS

## 2015-05-15 MED ORDER — ROCURONIUM BROMIDE 100 MG/10ML IV SOLN
INTRAVENOUS | Status: DC | PRN
  Administered 2015-05-15: 35 mg via INTRAVENOUS

## 2015-05-15 SURGICAL SUPPLY — 28 items
BLADE SURG SZ11 CARB STEEL (BLADE) ×3 IMPLANT
BNDG ADH 2 X3.75 FABRIC TAN LF (GAUZE/BANDAGES/DRESSINGS) ×9 IMPLANT
CANISTER SUCT 1200ML W/VALVE (MISCELLANEOUS) ×3 IMPLANT
CATH ROBINSON RED A/P 16FR (CATHETERS) ×3 IMPLANT
CHLORAPREP W/TINT 26ML (MISCELLANEOUS) ×3 IMPLANT
GLOVE BIO SURGEON STRL SZ8 (GLOVE) ×9 IMPLANT
GLOVE INDICATOR 8.0 STRL GRN (GLOVE) ×9 IMPLANT
GOWN STRL REUS W/ TWL LRG LVL3 (GOWN DISPOSABLE) ×1 IMPLANT
GOWN STRL REUS W/ TWL XL LVL3 (GOWN DISPOSABLE) ×1 IMPLANT
GOWN STRL REUS W/TWL LRG LVL3 (GOWN DISPOSABLE) ×2
GOWN STRL REUS W/TWL XL LVL3 (GOWN DISPOSABLE) ×2
IV LACTATED RINGERS 1000ML (IV SOLUTION) ×3 IMPLANT
KIT RM TURNOVER CYSTO AR (KITS) ×3 IMPLANT
NS IRRIG 1000ML POUR BTL (IV SOLUTION) ×3 IMPLANT
NS IRRIG 500ML POUR BTL (IV SOLUTION) ×3 IMPLANT
PACK DNC HYST (MISCELLANEOUS) ×3 IMPLANT
PACK GYN LAPAROSCOPIC (MISCELLANEOUS) ×3 IMPLANT
PAD OB MATERNITY 4.3X12.25 (PERSONAL CARE ITEMS) ×3 IMPLANT
PAD PREP 24X41 OB/GYN DISP (PERSONAL CARE ITEMS) ×3 IMPLANT
SLEEVE ENDOPATH XCEL 5M (ENDOMECHANICALS) ×3 IMPLANT
SUT PLAIN 4 0 FS 2 27 (SUTURE) IMPLANT
SUT VIC AB 0 CT2 27 (SUTURE) IMPLANT
SUT VIC AB 0 UR5 27 (SUTURE) IMPLANT
SUT VIC AB 4-0 SH 27 (SUTURE) ×4
SUT VIC AB 4-0 SH 27XANBCTRL (SUTURE) ×2 IMPLANT
TOWEL OR 17X26 4PK STRL BLUE (TOWEL DISPOSABLE) ×3 IMPLANT
TROCAR XCEL NON-BLD 5MMX100MML (ENDOMECHANICALS) ×3 IMPLANT
TUBING INSUFFLATOR HI FLOW (MISCELLANEOUS) ×3 IMPLANT

## 2015-05-15 NOTE — Op Note (Signed)
Leah Hartman PROCEDURE DATE: 05/15/2015 2:20 PM  PREOPERATIVE DIAGNOSIS: CHRONIC PELVIC PAIN POSTOPERATIVE DIAGNOSIS: normal uterus, normal right and left ovary, normal right fallopian tube, left paratubal cyst, multiple endometrial implants of cul de sac, pseudo-fenestration of left ovarian fossa, powder burn  Implants right ovarian fossa; normal bladder; multiple clips present from prior appendectomy PROCEDURE: Procedure(s) with comments: with excision and fulgeration of endometriosis (N/A) - with excision and fulgeration of endometriosis INTRAUTERINE DEVICE (IUD) INSERTION (N/A) SURGEON:  Dr. Daphine Deutscher A Jennie Hannay ASSISTANT: PA-S Vicente Masson ANESTHESIA: General Endotracheal  INDICATIONS: 19 y.o. No obstetric history on file. with history of chronic pelvic pain concerning for endometriosis desiring surgical evaluation.   Please see preoperative notes for further details.   FINDINGS:  Small uterus, normal ovaries and fallopian tubes bilaterally.  There is diffuse evidence of red-colored endometriosis lesions in the posterior cul-de-sac . Powder burn implants also present in the cul-de-sac, right ovarian fossa; white lesions present in the left ovarian fossa along with pseudo-fenestrations endometriosis Peritoneal biopsies were taken and sent to pathology. No other abdominal/pelvic abnormality.  Normal upper abdomen.   I/O's: Total I/O In: 100 [I.V.:100] Out: 50 [Urine:50] SPECIMENS: Peritoneal biopsies COMPLICATIONS: None immediate COUNTS:  YES  PROCEDURE IN DETAIL: The patient was brought to the operating room where she was placed in the supine position.  General endotracheal anesthesia was induced without difficulty.  She was placed in the dorsal lithotomy position using bumblebee stirrups.  A ChloraPrep and Betadine, abdominal, perineal, intravaginal prep and drape was performed in standard fashion.  The timeout was completed.  The red Robison catheter was used to drain the bladder  of clear urine.  A weighted speculum was placed into the vagina and a Hulka tenaculum was placed onto the cervix to facilitate uterine manipulation. Laparoscopy was performed in standard fashion.  The Opti view 5 mm trocar and sleeve were placed through a 5 mm subumbilical incision directly into the abdominal pelvic cavity, without evidence of bowel or vascular injury. One Additional 5 mm port was  placed in the lower quadrant, respectively, under direct visualization.  The above noted findings were photo documented. Multiple biopsies of the endometriosis lesions were taken the cul-de-sac, right and left ovarian fossa respectively. These areas were also cauterized using Kleppinger bipolar forceps. Irrigation of the pelvis was then performed with subsequent removal of irrigant fluid with a blunt tipped aspirator. Good hemostasis was noted. Upon completion of the procedures and inspection for hemostasis, the surgery was complete with all instrumentation being removed from the abdominal pelvic cavity.  The pneumoperitoneum was released.  The incisions were closed using simple interrupted sutures of 4-0 Vicryl.  The Mirena IUD was then inserted in standard fashion. The patient's and the TENACULUM removed and replaced 5 single-tooth cervix. The uterus was sounded to 8 cm. Mirena introducer was then placed in standard fashion with insertion of the IUD without difficulty. The strings were trimmed to 3 cm. All instrumentation was removed from the vagina..  The patient was awakened, extubated, and taken to the recovery room in satisfactory condition.  Herold Harms, MD [facility]

## 2015-05-15 NOTE — Anesthesia Preprocedure Evaluation (Signed)
Anesthesia Evaluation  Patient identified by MRN, date of birth, ID band Patient awake    Reviewed: Allergy & Precautions, NPO status , Patient's Chart, lab work & pertinent test results  History of Anesthesia Complications Negative for: history of anesthetic complications  Airway Mallampati: II  TM Distance: >3 FB Neck ROM: Full    Dental no notable dental hx.    Pulmonary neg pulmonary ROS,  breath sounds clear to auscultation  Pulmonary exam normal       Cardiovascular negative cardio ROS Normal cardiovascular examRhythm:Regular Rate:Normal  POTS    Neuro/Psych negative neurological ROS  negative psych ROS   GI/Hepatic Neg liver ROS, GERD-  ,  Endo/Other  negative endocrine ROS  Renal/GU negative Renal ROS  negative genitourinary   Musculoskeletal negative musculoskeletal ROS (+)   Abdominal   Peds negative pediatric ROS (+)  Hematology negative hematology ROS (+)   Anesthesia Other Findings   Reproductive/Obstetrics negative OB ROS                             Anesthesia Physical Anesthesia Plan  ASA: II  Anesthesia Plan: General   Post-op Pain Management:    Induction: Intravenous  Airway Management Planned: Oral ETT  Additional Equipment:   Intra-op Plan:   Post-operative Plan: Extubation in OR  Informed Consent: I have reviewed the patients History and Physical, chart, labs and discussed the procedure including the risks, benefits and alternatives for the proposed anesthesia with the patient or authorized representative who has indicated his/her understanding and acceptance.   Dental advisory given  Plan Discussed with: CRNA and Surgeon  Anesthesia Plan Comments:         Anesthesia Quick Evaluation

## 2015-05-15 NOTE — H&P (Signed)
  Date of Initial H&P:05/11/2015  History reviewed, patient examined, no change in status, stable for surgery. 

## 2015-05-15 NOTE — OR Nursing (Signed)
mirena info:  52mg , lot: [id number]

## 2015-05-15 NOTE — Anesthesia Procedure Notes (Signed)
Procedure Name: Intubation Date/Time: 05/15/2015 1:15 PM Performed by: Mathews Argyle Pre-anesthesia Checklist: Patient identified, Patient being monitored, Timeout performed, Emergency Drugs available and Suction available Patient Re-evaluated:Patient Re-evaluated prior to inductionOxygen Delivery Method: Circle system utilized Preoxygenation: Pre-oxygenation with 100% oxygen Intubation Type: IV induction Ventilation: Mask ventilation without difficulty Laryngoscope Size: Miller and 2 Grade View: Grade I Tube type: Oral Tube size: 7.0 mm Number of attempts: 1 Placement Confirmation: ETT inserted through vocal cords under direct vision,  positive ETCO2 and breath sounds checked- equal and bilateral Secured at: 20 cm Tube secured with: Tape Dental Injury: Teeth and Oropharynx as per pre-operative assessment

## 2015-05-15 NOTE — Anesthesia Postprocedure Evaluation (Signed)
  Anesthesia Post-op Note  Patient: Leah Hartman  Procedure(s) Performed: Procedure(s) with comments: with excision and fulgeration of endometriosis (N/A) - with excision and fulgeration of endometriosis INTRAUTERINE DEVICE (IUD) INSERTION (N/A)  Anesthesia type:General  Patient location: PACU  Post pain: Pain level controlled  Post assessment: Post-op Vital signs reviewed, Patient's Cardiovascular Status Stable, Respiratory Function Stable, Patent Airway and No signs of Nausea or vomiting  Post vital signs: Reviewed and stable  Last Vitals:  Filed Vitals:   05/15/15 1528  BP: 116/75  Pulse: 72  Temp:   Resp: 14    Level of consciousness: awake, alert  and patient cooperative  Complications: No apparent anesthesia complications

## 2015-05-15 NOTE — Discharge Instructions (Signed)
Endometriosis Endometriosis is a condition in which the tissue that lines the uterus (endometrium) grows outside of its normal location. The tissue may grow in many locations close to the uterus, but it commonly grows on the ovaries, fallopian tubes, vagina, or bowel. Because the uterus expels, or sheds, its lining every menstrual cycle, there is bleeding wherever the endometrial tissue is located. This can cause pain because blood is irritating to tissues not normally exposed to it.  CAUSES  The cause of endometriosis is not known.  SIGNS AND SYMPTOMS  Often, there are no symptoms. When symptoms are present, they can vary with the location of the displaced tissue. Various symptoms can occur at different times. Although symptoms occur mainly during a woman's menstrual period, they can also occur midcycle and usually stop with menopause. Some people may go months with no symptoms at all. Symptoms may include:   Back or abdominal pain.   Heavier bleeding during periods.   Pain during intercourse.   Painful bowel movements.   Infertility. DIAGNOSIS  Your health care provider will do a physical exam and ask about your symptoms. Various tests may be done, such as:   Blood tests and urine tests. These are done to help rule out other problems.   Ultrasound. This test is done to look for abnormal tissue.   An X-ray of the lower bowel (barium enema).  Laparoscopy. In this procedure, a thin, lighted tube with a tiny camera on the end (laparoscope) is inserted into your abdomen. This helps your health care provider look for abnormal tissue to confirm the diagnosis. The health care provider may also remove a small piece of tissue (biopsy) from any abnormal tissue found. This tissue sample can then be sent to a lab so it can be looked at under a microscope. TREATMENT  Treatment will vary and may include:   Medicines to relieve pain. Nonsteroidal anti-inflammatory drugs (NSAIDs) are a type of  pain medicine that can help to relieve the pain caused by endometriosis.  Hormonal therapy. When using hormonal therapy, periods are eliminated. This eliminates the monthly exposure to blood by the displaced endometrial tissue.   Surgery. Surgery may sometimes be done to remove the abnormal endometrial tissue. In severe cases, surgery may be done to remove the fallopian tubes, uterus, and ovaries (hysterectomy). HOME CARE INSTRUCTIONS   Take all medicines as directed by your health care provider. Do not take aspirin because it may increase bleeding when you are not on hormonal therapy.   Avoid activities that produce pain, including sexual activity. SEEK MEDICAL CARE IF:  You have pelvic pain before, after, or during your periods.  You have pelvic pain between periods that gets worse during your period.  You have pelvic pain during or after sex.  You have pelvic pain with bowel movements or urination, especially during your period.  You have problems getting pregnant.  You have a fever. SEEK IMMEDIATE MEDICAL CARE IF:   Your pain is severe and is not responding to pain medicine.   You have severe nausea and vomiting, or you cannot keep foods down.   You have pain that is limited to the right lower part of your abdomen.   You have swelling or increasing pain in your abdomen.   You see blood in your stool.  MAKE SURE YOU:   Understand these instructions.  Will watch your condition.  Will get help right away if you are not doing well or get worse. Document Released: 11/08/2000 Document   Revised: 03/28/2014 Document Reviewed: 07/09/2013 ExitCare Patient Information 2015 [employer]. This information is not intended to replace advice given to you by your health care provider. Make sure you discuss any questions you have with your health care provider.  

## 2015-05-15 NOTE — Transfer of Care (Signed)
Immediate Anesthesia Transfer of Care Note  Patient: Leah Hartman  Procedure(s) Performed: Procedure(s) with comments: with excision and fulgeration of endometriosis (N/A) - with excision and fulgeration of endometriosis INTRAUTERINE DEVICE (IUD) INSERTION (N/A)  Patient Location: PACU  Anesthesia Type:General  Level of Consciousness: sedated and responds to stimulation  Airway & Oxygen Therapy: Patient Spontanous Breathing and Patient connected to face mask oxygen  Post-op Assessment: Report given to RN  Post vital signs: Reviewed  Last Vitals:  Filed Vitals:   05/15/15 1428  BP: 129/79  Pulse: 89  Temp: 36.7 C  Resp: 14    Complications: No apparent anesthesia complications

## 2015-05-16 ENCOUNTER — Ambulatory Visit: Payer: No Typology Code available for payment source | Admitting: Internal Medicine

## 2015-05-16 LAB — SURGICAL PATHOLOGY

## 2015-05-23 ENCOUNTER — Encounter: Payer: Self-pay | Admitting: Obstetrics and Gynecology

## 2015-05-23 ENCOUNTER — Ambulatory Visit (INDEPENDENT_AMBULATORY_CARE_PROVIDER_SITE_OTHER): Payer: No Typology Code available for payment source | Admitting: Obstetrics and Gynecology

## 2015-05-23 VITALS — BP 104/70 | HR 88 | Ht 69.0 in | Wt 166.1 lb

## 2015-05-23 DIAGNOSIS — N809 Endometriosis, unspecified: Secondary | ICD-10-CM

## 2015-05-23 DIAGNOSIS — Z09 Encounter for follow-up examination after completed treatment for conditions other than malignant neoplasm: Secondary | ICD-10-CM

## 2015-05-23 NOTE — Progress Notes (Signed)
Patient ID: Leah Hartman, female   DOB: Nov 07, 1996, 19 y.o.   MRN: [id number]   1 week post op- lap w/bx and mirena insertion. Vaginal bleeding- none. Taking ibup as needed. Percocet- not at all. Incision slight draining and red. No fevers.  GYN ENCOUNTER NOTE  Subjective:       Leah Hartman is a 19 y.o. No obstetric history on file. female is here for gynecologic evaluation of the following issues:  1. Post-op check after excision and fulguration of endometriosis lesions     Gynecologic History Patient's last menstrual period was 04/15/2015. Contraception: IUD   Obstetric History OB History  No data available    Past Medical History  Diagnosis Date  . Syncope and collapse   . Ovarian cyst   . Ovarian cyst rupture   . Nausea & vomiting   . Abdominal pain   . GERD (gastroesophageal reflux disease)     heartburn   . Dysmenorrhea 05/10/2015  . POTS (postural orthostatic tachycardia syndrome)     Past Surgical History  Procedure Laterality Date  . Tonsillectomy and adenoidectomy    . Appendectomy    . Tympanostomy tube placement    . Ovarian cyst surgery Bilateral   . Esophagogastroduodenoscopy N/A 04/07/2015    Procedure: ESOPHAGOGASTRODUODENOSCOPY (EGD);  Surgeon: Midge Minium, MD;  [facility];  Service: Gastroenterology;  Laterality: N/A;  . Laparoscopy N/A 05/15/2015    Procedure: with excision and fulgeration of endometriosis;  Surgeon: Herold Harms, MD;  Location: [facility];  Service: Gynecology;  Laterality: N/A;  with excision and fulgeration of endometriosis  . Intrauterine device (iud) insertion N/A 05/15/2015    Procedure: INTRAUTERINE DEVICE (IUD) INSERTION;  Surgeon: Herold Harms, MD;  Location: [facility];  Service: Gynecology;  Laterality: N/A;    Current Outpatient Prescriptions on File Prior to Visit  Medication Sig Dispense Refill  . ibuprofen (ADVIL,MOTRIN) 600 MG tablet Take 1 tablet (600 mg total) by mouth every 6  (six) hours as needed for fever or headache. 30 tablet 0   No current facility-administered medications on file prior to visit.    No Known Allergies  History   Social History  . Marital Status: Single    Spouse Name: N/A  . Number of Children: N/A  . Years of Education: N/A   Occupational History  . [employer]    Social History Main Topics  . Smoking status: Never Smoker   . Smokeless tobacco: Never Used  . Alcohol Use: No  . Drug Use: No  . Sexual Activity: Yes    Birth Control/ Protection: IUD   Other Topics Concern  . Not on file   Social History Narrative    Family History  Problem Relation Age of Onset  . Hypertension Mother   . Diabetes Father   . Colon cancer Neg Hx   . Ovarian cancer Neg Hx   . Breast cancer Neg Hx     The following portions of the patient's history were reviewed and updated as appropriate: allergies, current medications, past family history, past medical history, past social history, past surgical history and problem list.  Review of Systems  Review of Systems - General ROS: negative for - chills, fatigue, fever, malaise Hematological and Lymphatic ROS: negative for - bleeding problems or swollen lymph nodes Gastrointestinal ROS: Positive for lower abdominal pain 5/10 with needles/pinprick sensation; negative for - blood in stools, change in bowel habits and nausea/vomiting Genito-Urinary ROS: negative for -  change in menstrual cycle, dysmenorrhea, dysuria, genital discharge, genital ulcers, hematuria, incontinence, irregular/heavy menses, nocturia or pelvic pain  Objective:   BP 104/70 mmHg  Pulse 88  Ht 5\' 9"  (1.753 m)  Wt 166 lb 1.6 oz (75.342 kg)  BMI 24.52 kg/m2  LMP 04/15/2015 CONSTITUTIONAL: Well-developed, well-nourished female in no acute distress.  HENT:  Normocephalic, atraumatic.   SKIN: Mild erythema around lower abdominal laparoscopy incision site, 1 stitch missing, 1 stitch remaining. Umbilical incision  healing well.  Skin is warm and dry. No rash noted. Not diaphoretic. No erythema. No pallor. NEUROLGIC: Alert and oriented to person, place, and time.  PSYCHIATRIC: Normal mood and affect. Normal behavior. Normal judgment and thought content. CARDIOVASCULAR: RRR RESPIRATORY: CTAB BREASTS: Not Examined ABDOMEN: Soft, non distended; Non tender.  No Organomegaly. PELVIC: Not examined MUSCULOSKELETAL: Not examined     Assessment:   1. Incisions post-laparoscopy healing well.  Plan:   1. Return in 4 weeks for Mirena string check. 2. Return in 3 months for follow up of endometriosis symptoms.

## 2015-05-26 DIAGNOSIS — K529 Noninfective gastroenteritis and colitis, unspecified: Secondary | ICD-10-CM | POA: Insufficient documentation

## 2015-06-08 ENCOUNTER — Telehealth: Payer: Self-pay | Admitting: Obstetrics and Gynecology

## 2015-06-08 NOTE — Telephone Encounter (Signed)
PT CALLED AND RECENTLY HAD IUD PUT IN AND SHE NORMALLY WILL BLEED FOR ABOUT 2-3 DAYS WHEN HER PERIOD COMES ON, BUT THIS TIME SHE HAS BEEN BLEEDING FOR OVER A WEEK AND SHE STATES SHE DOESN'T FEEL GOOD. PT WOULD LIKE A CALL BACK.

## 2015-06-08 NOTE — Telephone Encounter (Signed)
Pt is s/p iud insertion on 6/20. She is having on/off bleeding since. Changing q 2 hours. Pos Cramps- Advised that she will have irregular bleeding for 3-6 months. Keep up with bleeding on menstrual calendar. Advised ibup 800 q8 for the cramps. Pt aware if bleeding gets heavy- changing q 30 minutes to an hour or cramps not relived with ibup to contact office. Pt has iud string check next week. MAD to f/u then

## 2015-06-20 ENCOUNTER — Ambulatory Visit (INDEPENDENT_AMBULATORY_CARE_PROVIDER_SITE_OTHER): Payer: PRIVATE HEALTH INSURANCE | Admitting: Internal Medicine

## 2015-06-20 ENCOUNTER — Encounter: Payer: Self-pay | Admitting: Internal Medicine

## 2015-06-20 VITALS — BP 90/58 | HR 88 | Ht 69.0 in | Wt 169.0 lb

## 2015-06-20 DIAGNOSIS — I951 Orthostatic hypotension: Secondary | ICD-10-CM | POA: Diagnosis not present

## 2015-06-20 DIAGNOSIS — G901 Familial dysautonomia [Riley-Day]: Secondary | ICD-10-CM

## 2015-06-20 DIAGNOSIS — G90A Postural orthostatic tachycardia syndrome (POTS): Secondary | ICD-10-CM

## 2015-06-20 DIAGNOSIS — R000 Tachycardia, unspecified: Secondary | ICD-10-CM

## 2015-06-20 DIAGNOSIS — G909 Disorder of the autonomic nervous system, unspecified: Secondary | ICD-10-CM

## 2015-06-20 NOTE — Progress Notes (Signed)
,        Patient Care Team: Bartholomew Boards as PCP - General (Physician Assistant)   HPI  Leah Hartman is a 19 y.o. 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 These occurred in the wake of a mononucleosis infection.    She is deplete of salt and water in her diet. She was unable to take salt supplementation  because of GI irritation.  She has appt with [facility]  Dr Lavona Mound 7/16>> less vomiting on the new diet  Less dizziness  Still struggling with dysmennorhea  Urine Na 200 --great Fluid replete        Past Medical History  Diagnosis Date  . Syncope and collapse   . Ovarian cyst   . Ovarian cyst rupture   . Nausea & vomiting   . Abdominal pain   . GERD (gastroesophageal reflux disease)     heartburn   . Dysmenorrhea 05/10/2015  . POTS (postural orthostatic tachycardia syndrome)     Past Surgical History  Procedure Laterality Date  . Tonsillectomy and adenoidectomy    . Appendectomy    . Tympanostomy tube placement    . Ovarian cyst surgery Bilateral   . Esophagogastroduodenoscopy N/A 04/07/2015    Procedure: ESOPHAGOGASTRODUODENOSCOPY (EGD);  Surgeon: Midge Minium, MD;  Location: [facility];  Service: Gastroenterology;  Laterality: N/A;  . Laparoscopy N/A 05/15/2015    Procedure: with excision and fulgeration of endometriosis;  Surgeon: Herold Harms, MD;  Location: [facility];  Service: Gynecology;  Laterality: N/A;  with excision and fulgeration of endometriosis  . Intrauterine device (iud) insertion N/A 05/15/2015    Procedure: INTRAUTERINE DEVICE (IUD) INSERTION;  Surgeon: Herold Harms, MD;  Location: [facility];  Service: Gynecology;  Laterality: N/A;    No current outpatient prescriptions on file.   No current facility-administered medications for this visit.    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

## 2015-06-20 NOTE — Patient Instructions (Signed)
Medication Instructions:  Your physician recommends that you continue on your current medications as directed. Please refer to the Current Medication list given to you today.   Labwork: none  Testing/Procedures: none  Follow-Up: Your physician wants you to follow-up in: six months with Dr. Klein.  You will receive a reminder letter in the mail two months in advance. If you don't receive a letter, please call our office to schedule the follow-up appointment.   Any Other Special Instructions Will Be Listed Below (If Applicable).   

## 2015-06-22 ENCOUNTER — Ambulatory Visit: Payer: No Typology Code available for payment source | Admitting: Obstetrics and Gynecology

## 2015-07-04 DIAGNOSIS — Z87898 Personal history of other specified conditions: Secondary | ICD-10-CM | POA: Insufficient documentation

## 2015-07-04 DIAGNOSIS — Z8768 Personal history of other (corrected) conditions arising in the perinatal period: Secondary | ICD-10-CM | POA: Insufficient documentation

## 2015-07-19 ENCOUNTER — Ambulatory Visit (INDEPENDENT_AMBULATORY_CARE_PROVIDER_SITE_OTHER): Payer: No Typology Code available for payment source | Admitting: Obstetrics and Gynecology

## 2015-07-19 ENCOUNTER — Encounter: Payer: Self-pay | Admitting: Obstetrics and Gynecology

## 2015-07-19 VITALS — BP 100/65 | HR 71 | Ht 69.0 in | Wt 168.7 lb

## 2015-07-19 DIAGNOSIS — N809 Endometriosis, unspecified: Secondary | ICD-10-CM

## 2015-07-19 DIAGNOSIS — Z30431 Encounter for routine checking of intrauterine contraceptive device: Secondary | ICD-10-CM | POA: Diagnosis not present

## 2015-07-19 NOTE — Patient Instructions (Signed)
1. Return in 6 months for follow up

## 2015-07-19 NOTE — Progress Notes (Signed)
Patient ID: Leah Hartman, female   DOB: [birth year], 19 y.o.   MRN: [id number] 4 week iud string check iud inserted 6/20- spotting on/off No pelvic pain Has not felt for strings No issues with IC  Chief complaint: 1.IUD string check. 2.  Endometriosis.  Patient is status post laparoscopy with excision and fulguration of endometriosis and Mirena IUD insertion on 05/15/2015.  Findings at the time of surgery revealed multiple implants of endometriosis in the cul-de-sac, right ovarian fossa, as well as pseudo-fenestrations of the peritoneum in the left ovarian fossa.  Since surgery, she has had 1 cycle, which was normal.  She is not having any significant pelvic pain.  Past medical history, past surgical history, medications, allergies, reviewed; no significant changes.  Review of Systems  Constitutional: Negative.   Gastrointestinal: Negative.   Genitourinary: Negative.   Musculoskeletal: Negative.      OBJECTIVE: BP 100/65 mmHg  Pulse 71  Ht  (1.753 m)  Wt 168 lb 11.2 oz (76.522 kg)  BMI 24.90 kg/m2  LMP 06/15/2015 (Approximate) Patient is a pleasant, well-appearing white female in no acute distress. Back: Without CVA tenderness. Abdomen: Well-healed laparoscopy scars.  No tenderness or mass.  Pelvic exam: External genitalia-normal BUS-normal. Vagina-no lesions. Cervix-IUD string visualized, 2 cm; no cervical motion tenderness Uterus-nontender, mobile, normal size and shape. Adnexa-nontender, nonpalpable. Rectovaginal-normal.  External exam  IMPRESSION: 1.  Normal IUD string check. 2.  Endometriosis, minimally symptomatic.  Status post Mirena IUD insertion.  PLAN: 1.  Monitor symptomatology and contact us if it worsens. 2.  Return in 6 months for follow-up.

## 2015-08-16 ENCOUNTER — Encounter: Payer: Self-pay | Admitting: Obstetrics and Gynecology

## 2015-08-23 ENCOUNTER — Ambulatory Visit: Payer: No Typology Code available for payment source | Admitting: Obstetrics and Gynecology

## 2015-11-01 NOTE — Addendum Note (Signed)
Addended by: Sherri RadMCGHEE, Jerzi Tigert C on: 11/01/2015 09:21 AM   Modules accepted: Level of Service, SmartSet

## 2015-11-01 NOTE — Progress Notes (Signed)
This encounter was created in error - please disregard.

## 2015-11-13 ENCOUNTER — Encounter: Payer: Self-pay | Admitting: Physician Assistant

## 2015-11-13 ENCOUNTER — Ambulatory Visit: Payer: Self-pay | Admitting: Family

## 2015-11-13 VITALS — BP 110/70 | HR 106 | Temp 98.3°F

## 2015-11-13 DIAGNOSIS — J019 Acute sinusitis, unspecified: Secondary | ICD-10-CM

## 2015-11-13 MED ORDER — AZITHROMYCIN 250 MG PO TABS: ORAL_TABLET | ORAL | Status: DC

## 2015-11-13 NOTE — Progress Notes (Signed)
S/ ST , hoarseness, facial pain and pressure, Left ear stopped up  Blowing green, not taken anything  O/ alert mildly ill ENT tms dull, retracted, nasal mucosa inflamed with purulent rhinorhea + frontal and maxillary tenderness neck supple , heart rsr lungs clear A/ Acute rhinosinusitis P/ Zpack , supportive measures.

## 2015-11-21 ENCOUNTER — Telehealth: Payer: Self-pay | Admitting: *Deleted

## 2015-11-21 NOTE — Telephone Encounter (Signed)
Left message on machine for patient to contact the office.  Pt mother called to ask if pt can restart Ritalin. PCP would like cardiologist approval. Forward to Dr. Graciela HusbandsKlein.

## 2015-11-21 NOTE — Telephone Encounter (Signed)
Patient's mother called and patient needs to go back on [place] for her adhd.  Her pcp wants to know if Dr. Graciela HusbandsKlein will approve this due to her heart issues. Please call mother.

## 2015-11-22 NOTE — Telephone Encounter (Signed)
We can try  We will have to see

## 2015-11-23 NOTE — Telephone Encounter (Signed)
Spoke w/ pt.  Advised her of Dr. Odessa FlemingKlein's recommendation. She states that she sees a PA for her PCP and that they cannot write Ritalin, she asks if Dr. Graciela HusbandsKlein can prescribe this for her.  Advised her that her PA will have a supervising MD and that they can write this for her.  She is appreciative of the call and will call back if we can be of further assistance.

## 2015-11-23 NOTE — Telephone Encounter (Signed)
She can try going back on it and see how she does.

## 2016-01-08 ENCOUNTER — Telehealth: Payer: Self-pay | Admitting: Internal Medicine

## 2016-01-08 NOTE — Telephone Encounter (Signed)
3 attempts to schedule from recall.   Patient says she is out of town for a while and will call when she returns.    Deleting recall.

## 2016-01-16 ENCOUNTER — Ambulatory Visit: Payer: Self-pay | Admitting: Physician Assistant

## 2016-01-16 ENCOUNTER — Ambulatory Visit: Payer: No Typology Code available for payment source | Admitting: Obstetrics and Gynecology

## 2016-01-16 ENCOUNTER — Encounter: Payer: Self-pay | Admitting: Physician Assistant

## 2016-01-16 VITALS — BP 110/70 | HR 103 | Temp 98.3°F | Ht 69.0 in | Wt 170.0 lb

## 2016-01-16 DIAGNOSIS — Z0000 Encounter for general adult medical examination without abnormal findings: Secondary | ICD-10-CM

## 2016-01-16 NOTE — Progress Notes (Signed)
Here for employment physical, see scanned forms Tb test done in clinic

## 2016-01-31 ENCOUNTER — Encounter: Payer: Self-pay | Admitting: Obstetrics and Gynecology

## 2016-01-31 ENCOUNTER — Ambulatory Visit (INDEPENDENT_AMBULATORY_CARE_PROVIDER_SITE_OTHER): Payer: Managed Care, Other (non HMO) | Admitting: Obstetrics and Gynecology

## 2016-01-31 DIAGNOSIS — R102 Pelvic and perineal pain: Secondary | ICD-10-CM | POA: Diagnosis not present

## 2016-01-31 DIAGNOSIS — R319 Hematuria, unspecified: Secondary | ICD-10-CM | POA: Diagnosis not present

## 2016-01-31 DIAGNOSIS — N809 Endometriosis, unspecified: Secondary | ICD-10-CM | POA: Diagnosis not present

## 2016-01-31 MED ORDER — SULFAMETHOXAZOLE-TRIMETHOPRIM 800-160 MG PO TABS: 1.0000 | ORAL_TABLET | Freq: Two times a day (BID) | ORAL | Status: DC

## 2016-01-31 NOTE — Progress Notes (Signed)
GYN ENCOUNTER NOTE  Subjective:       Leah Hartman is a 20 y.o. No obstetric history on file. female is here for gynecologic evaluation of the following issues:  1. Acute abdominal pain.    History of endometriosis diagnosed by laparoscopy with peritoneal implants of endometriosis excised in the cul-de-sac and left ovarian fossa; Mirena IUD insertion in June 2016; now with three-week history of lower pelvic pain, bladder pressure, urinary frequency, and occasional spotting.  Patient is not having pain with intercourse.   Gynecologic History Patient's last menstrual period was 11/27/2015 (exact date). Contraception: IUD Last Pap: none. Results were: N/A Last mammogram: none. Results were: N/A  Obstetric History OB History  No data available    Past Medical History  Diagnosis Date  . Syncope and collapse   . Ovarian cyst   . Ovarian cyst rupture   . Nausea & vomiting   . Abdominal pain   . GERD (gastroesophageal reflux disease)     heartburn   . Dysmenorrhea 05/10/2015  . POTS (postural orthostatic tachycardia syndrome)     Past Surgical History  Procedure Laterality Date  . Tonsillectomy and adenoidectomy    . Appendectomy    . Tympanostomy tube placement    . Ovarian cyst surgery Bilateral   . Esophagogastroduodenoscopy N/A 04/07/2015    Procedure: ESOPHAGOGASTRODUODENOSCOPY (EGD);  Surgeon: Midge Minium, MD;  Location: [facility];  Service: Gastroenterology;  Laterality: N/A;  . Laparoscopy N/A 05/15/2015    Procedure: with excision and fulgeration of endometriosis;  Surgeon: Herold Harms, MD;  Location: [facility];  Service: Gynecology;  Laterality: N/A;  with excision and fulgeration of endometriosis  . Intrauterine device (iud) insertion N/A 05/15/2015    Procedure: INTRAUTERINE DEVICE (IUD) INSERTION;  Surgeon: Herold Harms, MD;  Location: [facility];  Service: Gynecology;  Laterality: N/A;    No current outpatient prescriptions on file prior  to visit.   No current facility-administered medications on file prior to visit.    No Known Allergies  Social History   Social History  . Marital Status: Single    Spouse Name: N/A  . Number of Children: N/A  . Years of Education: N/A   Occupational History  . [employer]    Social History Main Topics  . Smoking status: Never Smoker   . Smokeless tobacco: Never Used  . Alcohol Use: No  . Drug Use: No  . Sexual Activity: Yes    Birth Control/ Protection: IUD   Other Topics Concern  . Not on file   Social History Narrative    Family History  Problem Relation Age of Onset  . Hypertension Mother   . Diabetes Father   . Colon cancer Neg Hx   . Ovarian cancer Neg Hx   . Breast cancer Neg Hx     The following portions of the patient's history were reviewed and updated as appropriate: allergies, current medications, past family history, past medical history, past social history, past surgical history and problem list.  Review of Systems Review of Systems - General ROS: negative for - chills, fatigue, fever, hot flashes, malaise or night sweats Hematological and Lymphatic ROS: negative for - bleeding problems or swollen lymph nodes Gastrointestinal ROS: negative for - blood in stools, change in bowel habits and nausea/vomiting. Positive for abdominal pain Musculoskeletal ROS: negative for - joint pain, muscle pain or muscular weakness Genito-Urinary ROS: negative for - change in menstrual cycle, dysmenorrhea, dyspareunia, dysuria, genital discharge, genital  ulcers, hematuria, incontinence, irregular/heavy menses, nocturia.  POSITIVE-pelvic pain Objective:   BP 112/68 mmHg  Pulse 81  Wt 188 lb 3 oz (85.361 kg)  LMP 11/27/2015 (Exact Date) CONSTITUTIONAL: Well-developed, well-nourished female in no acute distress.  HENT:  Normocephalic, atraumatic.  NECK: Normal range of motion, supple, no masses.  Normal thyroid.  SKIN: Skin is warm and dry. No rash noted.  Not diaphoretic. No erythema. No pallor. NEUROLGIC: Alert and oriented to person, place, and time. PSYCHIATRIC: Normal mood and affect. Normal behavior. Normal judgment and thought content. CARDIOVASCULAR:Not Examined RESPIRATORY: Not Examined BREASTS: Not Examined ABDOMEN: Soft, non distended; no guarding, rebound. No abdominal tenderness. No Organomegaly.  PELVIC:  External Genitalia: Normal  BUS: Normal  Vagina: anterior vaginal wall, tender; no lesions  Cervix: cervical motion tenderness, mild; IUD string identified, 2 cm  Uterus: Normal size, shape,consistency, mobile, nontender  Adnexa: nonpalpable; mild bilateral lower quadrant tenderness  RV: Normal external exam  Bladder: Tender to palpation MUSCULOSKELETAL: Normal range of motion. No tenderness.  No cyanosis, clubbing, or edema.     Assessment:   1. Pelvic pain in female; may be related to UTI; Doubts malpositioning of IUD; possible endometriosis exacerbation - Urine culture - POCT urinalysis dipstick  2. Hematuria - Urine culture - POCT urinalysis dipstick  3. Endometriosis; possible etiology of pelvic pain, although less likely than probable UTI  4. Recent weight gain(Since Mirena IUD insertion without change in activity)    Plan:   1. Pelvic pain / Hematuria  Possibly secondary to UTI, start on Septra DS x7 days. Continue OTC pain medication as needed. Increase fluid intake and cranberry juice intake  2. Weight gain  Order TSH  3.  Ultrasound to assess IUD location  4.  Return in 1 week following ultrasound for further management planning  A total of 15 minutes were spent face-to-face with the patient during this encounter and over half of that time dealt with counseling and coordination of care.  Herold HarmsMartin A Keliah Harned, MD  Note: This dictation was prepared with Dragon dictation along with smaller phrase technology. Any transcriptional errors that result from this process are unintentional.

## 2016-01-31 NOTE — Patient Instructions (Signed)
1.  Increase water intake and cranberry juice intake. 2.  Septra DS 1 tablet twice a day for 7 days. 3.  Pelvic ultrasound is scheduled. 4.  Return 1 week following ultrasound for further management planning. 5.  Urine culture is sent to rule out UTI

## 2016-02-01 ENCOUNTER — Ambulatory Visit: Payer: No Typology Code available for payment source | Admitting: Obstetrics and Gynecology

## 2016-02-01 LAB — URINE CULTURE: ORGANISM ID, BACTERIA: NO GROWTH

## 2016-02-02 ENCOUNTER — Encounter: Payer: Self-pay | Admitting: Physician Assistant

## 2016-02-02 ENCOUNTER — Ambulatory Visit: Payer: Self-pay | Admitting: Physician Assistant

## 2016-02-02 ENCOUNTER — Ambulatory Visit: Payer: Managed Care, Other (non HMO)

## 2016-02-02 VITALS — BP 116/72 | HR 93 | Temp 98.4°F | Wt 183.0 lb

## 2016-02-02 DIAGNOSIS — R635 Abnormal weight gain: Secondary | ICD-10-CM

## 2016-02-02 NOTE — Progress Notes (Signed)
S: here to discuss recent weight gain of 20lbs, pt states she has gained this since going on iud, pelvic pain not better and is having an ultrasound next week to check placement of iud, states is trying to eat better but continues to gain weight, is sexually active, ? If thyroid problems, labs that were done a few months ago had normal tsh levels  O: vitals wnl, nad, weight at 183, lungs c t a, cv rrr  A: weight gain  P: discussed weight loss options, pt should not take otc or other diet pills due to heart condition, told her to get fitpal on her phone and to consider weight watchers, ? If weight gain from hormone therapy, if unhappy with iud have gyn remove it

## 2016-02-07 ENCOUNTER — Other Ambulatory Visit: Payer: Self-pay

## 2016-02-07 ENCOUNTER — Ambulatory Visit (INDEPENDENT_AMBULATORY_CARE_PROVIDER_SITE_OTHER): Payer: Managed Care, Other (non HMO) | Admitting: Obstetrics and Gynecology

## 2016-02-07 ENCOUNTER — Encounter: Payer: Self-pay | Admitting: Obstetrics and Gynecology

## 2016-02-07 VITALS — BP 106/69 | HR 94 | Ht 69.0 in | Wt 189.0 lb

## 2016-02-07 DIAGNOSIS — N949 Unspecified condition associated with female genital organs and menstrual cycle: Secondary | ICD-10-CM

## 2016-02-07 DIAGNOSIS — G8929 Other chronic pain: Secondary | ICD-10-CM

## 2016-02-07 DIAGNOSIS — N809 Endometriosis, unspecified: Secondary | ICD-10-CM

## 2016-02-07 DIAGNOSIS — R102 Pelvic and perineal pain: Secondary | ICD-10-CM

## 2016-02-07 DIAGNOSIS — R635 Abnormal weight gain: Secondary | ICD-10-CM

## 2016-02-07 NOTE — Progress Notes (Signed)
Patient came in to have blood drawn per Dr. Bridget Hartshornefrancesco's orders.  Blood was drawn from the right arm without any incident.

## 2016-02-07 NOTE — Patient Instructions (Signed)
1. Medications for treatment of severe endometriosis include:  Depo-Lupron 3.75 mg monthly for 6 months, or  Danazol 400 mg twice a day; liver function tests will need to be checked every 6 months during danazol therapy; backup birth control will be needed.  2. Call when IUD removal is desired  3. TSH is to be drawn today

## 2016-02-07 NOTE — Progress Notes (Signed)
GYN ENCOUNTER NOTE  Subjective:       Leah Hartman is a 20 y.o. No obstetric history on file. female is here for gynecologic evaluation of the following issues:  1. Acute abdominal pain 2. Recent weight gain   Patient has history of endometriosis dx laparoscopically; Mirena IUD insertion 04/2015.  She was seen 1 week prior for diffuse pelvic pain of 3 weeks duration.  Her appt today pwas originally for follow-up of [place] scheduled for 02/02/16, but patient unable to receive due to insurance coverage refusal.   Gynecologic History Patient's last menstrual period was 11/27/2015 (exact date). Contraception: IUD Last Pap: none. Results were: N/A Last mammogram: none. Results were: N/A  Obstetric History OB History  No data available    Past Medical History  Diagnosis Date  . Syncope and collapse   . Ovarian cyst   . Ovarian cyst rupture   . Nausea & vomiting   . Abdominal pain   . GERD (gastroesophageal reflux disease)     heartburn   . Dysmenorrhea 05/10/2015  . POTS (postural orthostatic tachycardia syndrome)     Past Surgical History  Procedure Laterality Date  . Tonsillectomy and adenoidectomy    . Appendectomy    . Tympanostomy tube placement    . Ovarian cyst surgery Bilateral   . Esophagogastroduodenoscopy N/A 04/07/2015    Procedure: ESOPHAGOGASTRODUODENOSCOPY (EGD);  Surgeon: Midge Minium, MD;  Location: [facility];  Service: Gastroenterology;  Laterality: N/A;  . Laparoscopy N/A 05/15/2015    Procedure: with excision and fulgeration of endometriosis;  Surgeon: Herold Harms, MD;  Location: [facility];  Service: Gynecology;  Laterality: N/A;  with excision and fulgeration of endometriosis  . Intrauterine device (iud) insertion N/A 05/15/2015    Procedure: INTRAUTERINE DEVICE (IUD) INSERTION;  Surgeon: Herold Harms, MD;  Location: [facility];  Service: Gynecology;  Laterality: N/A;    Current Outpatient Prescriptions on File Prior to Visit   Medication Sig Dispense Refill  . sulfamethoxazole-trimethoprim (BACTRIM DS,SEPTRA DS) 800-160 MG tablet Take 1 tablet by mouth 2 (two) times daily. 14 tablet 1   No current facility-administered medications on file prior to visit.    No Known Allergies  Social History   Social History  . Marital Status: Single    Spouse Name: N/A  . Number of Children: N/A  . Years of Education: N/A   Occupational History  . [employer]    Social History Main Topics  . Smoking status: Never Smoker   . Smokeless tobacco: Never Used  . Alcohol Use: No  . Drug Use: No  . Sexual Activity: Yes    Birth Control/ Protection: IUD   Other Topics Concern  . Not on file   Social History Narrative    Family History  Problem Relation Age of Onset  . Hypertension Mother   . Diabetes Father   . Colon cancer Neg Hx   . Ovarian cancer Neg Hx   . Breast cancer Neg Hx     The following portions of the patient's history were reviewed and updated as appropriate: allergies, current medications, past family history, past medical history, past social history, past surgical history and problem list.  Review of Systems Review of Systems - General ROS: negative for - chills, fatigue, fever, hot flashes, malaise or night sweats Hematological and Lymphatic ROS: negative for - bleeding problems or swollen lymph nodes Gastrointestinal ROS: negative for - abdominal pain, blood in stools, change in bowel habits and nausea/vomiting  Musculoskeletal ROS: negative for - joint pain, muscle pain or muscular weakness Genito-Urinary ROS: negative for - change in menstrual cycle, dysmenorrhea, dyspareunia, dysuria, genital discharge, genital ulcers, hematuria, incontinence, irregular/heavy menses, nocturia or pelvic painjj  Objective:   LMP 11/27/2015 (Exact Date) Physical exam deferred   Assessment:    1.  Pelvic pain: patient completed Septra course; urine culture and urinalysis negative.  2.   Weight gain: TSH ordered 3.  Endometriosis: possible etiology of pain exacerbation; options of management were reviewed including Depo-Lupron therapy versus danazol therapy. Patient and mom did not want Lupron therapy because of side effect issues and family members had adverse events. Danazol will be researched and then subsequently decided upon.   Plan:    Continue OTC pain medication as needed for persistent pelvic pain. Increase fluid and cranberry juice intake TSH pending Patient unable to get Ultrasound to assess IUD location due to insurance refusing coverage Patient to contact us when she desires Mirena IUD removal Patient will research danazol therapy and consider this treatment option  A total of 15 minutes were spent face-to-face with the patient during this encounter and over half of that time dealt with counseling and coordination of care.   Octavia Heiraroline Thanya Cegielski, PA-S Herold HarmsMartin A Defrancesco, MD   I have seen, interviewed, and examined the patient in conjunction with the [employer] and affirm the diagnosis and management plan. Martin A. DeFrancesco, MD, FACOG   Note: This dictation was prepared with Dragon dictation along with smaller phrase technology. Any transcriptional errors that result from this process are unintentional.

## 2016-02-08 LAB — TSH: TSH: 1.5 u[IU]/mL (ref 0.450–4.500)

## 2016-02-14 ENCOUNTER — Emergency Department
Admission: EM | Admit: 2016-02-14 | Discharge: 2016-02-14 | Disposition: A | Discharge status: Home / Self Care | Payer: Managed Care, Other (non HMO) | Attending: Student | Admitting: Student

## 2016-02-14 ENCOUNTER — Emergency Department: Payer: Managed Care, Other (non HMO)

## 2016-02-14 ENCOUNTER — Encounter: Payer: Self-pay | Admitting: Emergency Medicine

## 2016-02-14 DIAGNOSIS — Z975 Presence of (intrauterine) contraceptive device: Secondary | ICD-10-CM | POA: Insufficient documentation

## 2016-02-14 DIAGNOSIS — N83202 Unspecified ovarian cyst, left side: Secondary | ICD-10-CM | POA: Diagnosis not present

## 2016-02-14 DIAGNOSIS — R102 Pelvic and perineal pain: Secondary | ICD-10-CM | POA: Diagnosis not present

## 2016-02-14 DIAGNOSIS — R1030 Lower abdominal pain, unspecified: Secondary | ICD-10-CM

## 2016-02-14 DIAGNOSIS — R112 Nausea with vomiting, unspecified: Secondary | ICD-10-CM | POA: Diagnosis present

## 2016-02-14 DIAGNOSIS — R52 Pain, unspecified: Secondary | ICD-10-CM

## 2016-02-14 HISTORY — DX: Endometriosis, unspecified: N80.9

## 2016-02-14 LAB — COMPREHENSIVE METABOLIC PANEL
ALT: 13 U/L — ABNORMAL LOW (ref 14–54)
ANION GAP: 4 — AB (ref 5–15)
AST: 23 U/L (ref 15–41)
Albumin: 3.8 g/dL (ref 3.5–5.0)
Alkaline Phosphatase: 50 U/L (ref 38–126)
BILIRUBIN TOTAL: 1.1 mg/dL (ref 0.3–1.2)
BUN: 12 mg/dL (ref 6–20)
CHLORIDE: 109 mmol/L (ref 101–111)
CO2: 23 mmol/L (ref 22–32)
Calcium: 8.2 mg/dL — ABNORMAL LOW (ref 8.9–10.3)
Creatinine, Ser: 0.59 mg/dL (ref 0.44–1.00)
GFR calc Af Amer: >60 mL/min (ref >60)
Glucose, Bld: 85 mg/dL (ref 65–99)
POTASSIUM: 3.7 mmol/L (ref 3.5–5.1)
Sodium: 136 mmol/L (ref 135–145)
TOTAL PROTEIN: 6.2 g/dL — AB (ref 6.5–8.1)

## 2016-02-14 LAB — WET PREP, GENITAL
Clue Cells Wet Prep HPF POC: NONE SEEN
SPERM: NONE SEEN
TRICH WET PREP: NONE SEEN
YEAST WET PREP: NONE SEEN

## 2016-02-14 LAB — URINALYSIS COMPLETE WITH MICROSCOPIC (ARMC ONLY)
BACTERIA UA: NONE SEEN
Bilirubin Urine: NEGATIVE
Glucose, UA: NEGATIVE mg/dL
Hgb urine dipstick: NEGATIVE
Leukocytes, UA: NEGATIVE
Nitrite: NEGATIVE
PROTEIN: NEGATIVE mg/dL
RBC / HPF: NONE SEEN RBC/hpf (ref 0–5)
Specific Gravity, Urine: 1.033 — ABNORMAL HIGH (ref 1.005–1.030)
pH: 5 (ref 5.0–8.0)

## 2016-02-14 LAB — CBC
HEMATOCRIT: 40.6 % (ref 35.0–47.0)
Hemoglobin: 13.5 g/dL (ref 12.0–16.0)
MCH: 29.5 pg (ref 26.0–34.0)
MCHC: 33.3 g/dL (ref 32.0–36.0)
MCV: 88.5 fL (ref 80.0–100.0)
Platelets: 172 10*3/uL (ref 150–440)
RBC: 4.59 MIL/uL (ref 3.80–5.20)
RDW: 14.5 % (ref 11.5–14.5)
WBC: 12.5 10*3/uL — ABNORMAL HIGH (ref 3.6–11.0)

## 2016-02-14 LAB — LIPASE, BLOOD: LIPASE: 17 U/L (ref 11–51)

## 2016-02-14 LAB — CHLAMYDIA/NGC RT PCR (ARMC ONLY)
Chlamydia Tr: NOT DETECTED
N gonorrhoeae: NOT DETECTED

## 2016-02-14 LAB — PREGNANCY, URINE: Preg Test, Ur: NEGATIVE

## 2016-02-14 MED ORDER — NAPROXEN 500 MG PO TABS: 500.0000 mg | ORAL_TABLET | Freq: Two times a day (BID) | ORAL | Status: DC

## 2016-02-14 MED ORDER — ONDANSETRON HCL 4 MG/2ML IJ SOLN
INTRAMUSCULAR | Status: AC
  Administered 2016-02-14: 4 mg via INTRAVENOUS
  Filled 2016-02-14: qty 2

## 2016-02-14 MED ORDER — MORPHINE SULFATE (PF) 4 MG/ML IV SOLN
4.0000 mg | Freq: Once | INTRAVENOUS | Status: AC
  Administered 2016-02-14: 4 mg via INTRAVENOUS
  Filled 2016-02-14: qty 1

## 2016-02-14 MED ORDER — ONDANSETRON HCL 4 MG/2ML IJ SOLN
4.0000 mg | Freq: Once | INTRAMUSCULAR | Status: AC | PRN
  Administered 2016-02-14: 4 mg via INTRAVENOUS

## 2016-02-14 MED ORDER — ONDANSETRON HCL 4 MG/2ML IJ SOLN
4.0000 mg | Freq: Once | INTRAMUSCULAR | Status: AC
  Administered 2016-02-14: 4 mg via INTRAVENOUS

## 2016-02-14 MED ORDER — MORPHINE SULFATE (PF) 4 MG/ML IV SOLN
INTRAVENOUS | Status: AC
  Administered 2016-02-14: 4 mg via INTRAVENOUS
  Filled 2016-02-14: qty 1

## 2016-02-14 MED ORDER — MORPHINE SULFATE (PF) 4 MG/ML IV SOLN
4.0000 mg | Freq: Once | INTRAVENOUS | Status: AC
  Administered 2016-02-14: 4 mg via INTRAVENOUS

## 2016-02-14 MED ORDER — OXYCODONE HCL 5 MG PO TABS
5.0000 mg | ORAL_TABLET | Freq: Four times a day (QID) | ORAL | Status: DC | PRN
Start: 2016-02-14 — End: 2016-04-02

## 2016-02-14 MED ORDER — SODIUM CHLORIDE 0.9 % IV BOLUS (SEPSIS)
1000.0000 mL | Freq: Once | INTRAVENOUS | Status: AC
  Administered 2016-02-14: 1000 mL via INTRAVENOUS

## 2016-02-14 MED ORDER — ONDANSETRON 4 MG PO TBDP: 4.0000 mg | ORAL_TABLET | Freq: Three times a day (TID) | ORAL | Status: DC | PRN

## 2016-02-14 NOTE — ED Notes (Addendum)
Patient presents to the ED with lower abdominal pain that began at 7:30am and vomiting x 4 in the past hour.  Patient is in no obvious distress at this time.  Patient has had similar pain over the past 4-6 weeks and has seen her OB-GYN who thought her IUD may be out of place.  Patient reports never having vomiting with the pain until today.  Patient denies diarrhea.  Patient has a history of ovarian cyst.

## 2016-02-14 NOTE — ED Notes (Signed)
Nausea improved.

## 2016-02-14 NOTE — ED Notes (Signed)
Discharge instructions reviewed with patient. Patient verbalized understanding. Patient ambulated to lobby without difficulty.   

## 2016-02-14 NOTE — Progress Notes (Signed)
Lab results were sent to Dr[facility] Gregg[facility].

## 2016-02-14 NOTE — ED Provider Notes (Signed)
[facility] Emergency Department Provider Note  ____________________________________________  Time seen: Approximately 3:38 PM  I have reviewed the triage vital signs and the nursing notes.   HISTORY  Chief Complaint Emesis and Abdominal Pain    HPI Leah Hartman is a 20 y.o. female history of POTS, endometriosis, ovarian cyst with rupture who presents for evaluation of sudden worsening of lower abdominal cramping today as well as 4 episodes of nonbloody nonbilious emesis, constant since onset, severe, no modifying factors. The patient reports that she has had lower abdominal pain for over a month however it suddenly worsened today. She was seen by her OB/GYN doctor who was concerned about the placement of her IUD sometime ago. No fevers or chills. No diarrhea. She has had some abnormal vaginal discharge but no abnormal vaginal bleeding. She has "pressure" when she urinates but denies pain.   Past Medical History  Diagnosis Date  . Syncope and collapse   . Ovarian cyst   . Ovarian cyst rupture   . Nausea & vomiting   . Abdominal pain   . GERD (gastroesophageal reflux disease)     heartburn   . Dysmenorrhea 05/10/2015  . POTS (postural orthostatic tachycardia syndrome)   . Endometriosis     Patient Active Problem List   Diagnosis Date Noted  . Endometriosis 07/19/2015  . Personal history of perinatal problems 07/04/2015  . Chronic diarrhea 05/26/2015  . Chronic pelvic pain in female 05/10/2015  . Family history of endometriosis in first degree relative 05/10/2015  . Dysmenorrhea 05/10/2015  . Menorrhagia 05/10/2015  . Syncope 01/31/2015  . Chronic fatigue 01/31/2015  . Nausea and vomiting 01/31/2015  . SOB (shortness of breath) 09/27/2014  . PSVT (paroxysmal supraventricular tachycardia) (HCC) 09/27/2014    Past Surgical History  Procedure Laterality Date  . Tonsillectomy and adenoidectomy    . Appendectomy    . Tympanostomy tube  placement    . Ovarian cyst surgery Bilateral   . Esophagogastroduodenoscopy N/A 04/07/2015    Procedure: ESOPHAGOGASTRODUODENOSCOPY (EGD);  Surgeon: Midge Miniumarren Wohl, MD;  [facility];  Service: Gastroenterology;  Laterality: N/A;  . Laparoscopy N/A 05/15/2015    Procedure: with excision and fulgeration of endometriosis;  Surgeon: Herold HarmsMartin A Defrancesco, MD;  Location: [facility];  Service: Gynecology;  Laterality: N/A;  with excision and fulgeration of endometriosis  . Intrauterine device (iud) insertion N/A 05/15/2015    Procedure: INTRAUTERINE DEVICE (IUD) INSERTION;  Surgeon: Herold HarmsMartin A Defrancesco, MD;  Location: [facility];  Service: Gynecology;  Laterality: N/A;    Current Outpatient Rx  Name  Route  Sig  Dispense  Refill  . sulfamethoxazole-trimethoprim (BACTRIM DS,SEPTRA DS) 800-160 MG tablet   Oral   Take 1 tablet by mouth 2 (two) times daily.   14 tablet   1     Allergies Review of patient's allergies indicates no known allergies.  Family History  Problem Relation Age of Onset  . Hypertension Mother   . Diabetes Father   . Colon cancer Neg Hx   . Ovarian cancer Neg Hx   . Breast cancer Neg Hx     Social History Social History  Substance Use Topics  . Smoking status: Never Smoker   . Smokeless tobacco: Never Used  . Alcohol Use: No    Review of Systems Constitutional: No fever/chills Eyes: No visual changes. ENT: No sore throat. Cardiovascular: Denies chest pain. Respiratory: Denies shortness of breath. Gastrointestinal: + abdominal pain.  + nausea, + vomiting.  No diarrhea.  No constipation. Genitourinary: Negative for dysuria. Musculoskeletal: Negative for back pain. Skin: Negative for rash. Neurological: Negative for headaches, focal weakness or numbness.  10-point ROS otherwise negative.  ____________________________________________   PHYSICAL EXAM:  VITAL SIGNS: ED Triage Vitals  Enc Vitals Group     BP 02/14/16 1518 108/58 mmHg      Pulse Rate 02/14/16 1518 94     Resp 02/14/16 1518 18     Temp 02/14/16 1518 98.2 F (36.8 C)     Temp Source 02/14/16 1518 Oral     SpO2 02/14/16 1518 100 %     Weight 02/14/16 1518 188 lb (85.276 kg)     Height 02/14/16 1518  (1.651 m)     Head Cir --      Peak Flow --      Pain Score 02/14/16 1519 10     Pain Loc --      Pain Edu? --      Excl. in GC? --     Constitutional: Alert and oriented. Nontoxic-appearing and in no acute distress. Eyes: Conjunctivae are normal. PERRL. EOMI. Head: Atraumatic. Nose: No congestion/rhinnorhea. Mouth/Throat: Mucous membranes are moist.  Oropharynx non-erythematous. Neck: No stridor. Supple without meningismus. Cardiovascular: Normal rate, regular rhythm. Grossly normal heart sounds.  Good peripheral circulation. Respiratory: Normal respiratory effort.  No retractions. Lungs CTAB. Gastrointestinal: Soft with mild tenderness throughout the lower abdomen, no rebound or guarding.Marland Kitchen No CVA tenderness. Pelvic: IUD strings are present and sitting up on, small amount of thick white non-malodorous discharge in the vaginal vault Musculoskeletal: No lower extremity tenderness nor edema.  No joint effusions. Neurologic:  Normal speech and language. No gross focal neurologic deficits are appreciated. No gait instability. Skin:  Skin is warm, dry and intact. No rash noted. Psychiatric: Mood and affect are normal. Speech and behavior are normal.  ____________________________________________   LABS (all labs ordered are listed, but only abnormal results are displayed)  Labs Reviewed  WET PREP, GENITAL - Abnormal; Notable for the following:    WBC, Wet Prep HPF POC MODERATE (*)    All other components within normal limits  CBC - Abnormal; Notable for the following:    WBC 12.5 (*)    All other components within normal limits  URINALYSIS COMPLETEWITH MICROSCOPIC ([facility] ONLY) - Abnormal; Notable for the following:    Color, Urine YELLOW (*)     APPearance HAZY (*)    Ketones, ur TRACE (*)    Specific Gravity, Urine 1.033 (*)    Squamous Epithelial / LPF 6-30 (*)    All other components within normal limits  COMPREHENSIVE METABOLIC PANEL - Abnormal; Notable for the following:    Calcium 8.2 (*)    Total Protein 6.2 (*)    ALT 13 (*)    Anion gap 4 (*)    All other components within normal limits  CHLAMYDIA/NGC RT PCR ([facility] ONLY)  PREGNANCY, URINE  LIPASE, BLOOD   ____________________________________________  EKG  none ____________________________________________  RADIOLOGY  Transvaginal ultrasound FINDINGS: Uterus  Measurements: 7.5 x 3.3 x 5.1 cm. Small nabothian cysts measure up to 4 mm in size. An intrauterine device is in place.  Endometrium  Thickness: 5 mm. Trace fluid in the endocervical canal.  Right ovary  Measurements: 2.7 x 1.8 x 2.4 cm. Normal appearance/no adnexal mass.  Left ovary  Measurements: 4.9 x 2.8 x 5.2 cm. 4.8 x 2.0 x 4.5 cm complex cystic lesion containing both simple appearing fluid as well as echogenic material  with mild reticulation and some concave margins favored to reflect retracting clot in a hemorrhagic cyst.  Pulsed Doppler evaluation of both ovaries demonstrates normal low-resistance arterial and venous waveforms.  Other findings  Small to moderate amount of free fluid.  IMPRESSION: 1. Likely hemorrhagic cyst in the left ovary. Unremarkable right ovary. No evidence of ovarian torsion. 2. IUD in place. No significant uterine abnormality identified. 3. Small to moderate amount of free fluid. ____________________________________________   PROCEDURES  Procedure(s) performed: None  Critical Care performed: No  ____________________________________________   INITIAL IMPRESSION / ASSESSMENT AND PLAN / ED COURSE  Pertinent labs & imaging results that were available during my care of the patient were reviewed by me and considered in my medical  decision making (see chart for details).  Leah Hartman is a 20 y.o. female history of POTS, endometriosis, ovarian cyst with rupture who presents for evaluation of sudden worsening of lower abdominal cramping today as well as 4 episodes of nonbloody nonbilious emesis. On exam, she is nontoxic appearing and in no distress but she is complaining of pain. Vital signs are stable, she is afebrile. She has mild tenderness to palpation throughout the lower abdomen. We'll treat her pain and nausea, give IV fluids, obtain screening labs as well as transvaginal ultrasound to rule out torsion and confirmed placement of IUD though her strings are visible on exam which is reassuring.  ----------------------------------------- 7:30 PM on 02/14/2016 -----------------------------------------  Patient with significant symptomatic improvement at this time. Tolerating by mouth without vomiting. Labs reviewed. CBC notable for mild leukocytosis. Unremarkable lipase, CMP, urinalysis. Urine pregnancy test is negative. Negative GC chlamydia, and wet prep. Transvaginal ultrasound negative for torsion. There is a hemorrhagic left ovarian cyst which is the most likely cause of the patient's pelvic pain. Her IUD is in place. We discussed treatment with naproxen, short course of oxycodone for breakthrough pain, and need for close OB/GYN follow-up. We discussed return precautions and she is comfortable with the discharge plan. DC home. ____________________________________________   FINAL CLINICAL IMPRESSION(S) / ED DIAGNOSES  Final diagnoses:  Pain  Lower abdominal pain  Cyst of left ovary  Pelvic pain in female      Gayla Doss, MD 02/14/16 1932

## 2016-02-20 ENCOUNTER — Ambulatory Visit: Payer: No Typology Code available for payment source | Admitting: Obstetrics and Gynecology

## 2016-04-02 ENCOUNTER — Encounter: Payer: Self-pay | Admitting: Physician Assistant

## 2016-04-02 ENCOUNTER — Ambulatory Visit: Payer: Self-pay | Admitting: Physician Assistant

## 2016-04-02 VITALS — BP 105/60 | HR 121 | Temp 98.7°F

## 2016-04-02 DIAGNOSIS — J069 Acute upper respiratory infection, unspecified: Secondary | ICD-10-CM

## 2016-04-02 DIAGNOSIS — R05 Cough: Secondary | ICD-10-CM

## 2016-04-02 DIAGNOSIS — R059 Cough, unspecified: Secondary | ICD-10-CM

## 2016-04-02 MED ORDER — HYDROCOD POLST-CPM POLST ER 10-8 MG/5ML PO SUER: 5.0000 mL | Freq: Two times a day (BID) | ORAL | Status: DC | PRN

## 2016-04-02 NOTE — Progress Notes (Signed)
S/ mild uri sxs x 4 days, c/c cough all night , dry, denies localised sxs, ears full and popping  No fever, CP or SOB, taking allergy meds  O/ VSS NAD ent tms retracted with fluid, nasal turbinates allergic, throat clear ,neck supple heart rsr lungs clear   A URI P Supportive measures discussed. Follow up with TC  prn not improving  For antbx  rx written tussionex 1 tsp q 12 h prn #100 o rf

## 2016-04-03 NOTE — Progress Notes (Signed)
Patient ID: Leah Hartman, female   DOB: 02/05/1996, 20 y.o.   MRN: [id number] Per Susan's authorization I called in a Zpack and Tessalon Perls 200mg  [place] [facility] [place].

## 2016-10-03 ENCOUNTER — Encounter: Payer: Self-pay | Admitting: Physician Assistant

## 2016-10-03 ENCOUNTER — Ambulatory Visit: Payer: Self-pay | Admitting: Physician Assistant

## 2016-10-03 VITALS — BP 113/77 | HR 99 | Temp 98.7°F

## 2016-10-03 DIAGNOSIS — J0100 Acute maxillary sinusitis, unspecified: Secondary | ICD-10-CM

## 2016-10-03 DIAGNOSIS — E049 Nontoxic goiter, unspecified: Secondary | ICD-10-CM

## 2016-10-03 MED ORDER — CEFDINIR 300 MG PO CAPS: 300.0000 mg | ORAL_CAPSULE | Freq: Two times a day (BID) | ORAL | 0 refills | Status: DC

## 2016-10-03 MED ORDER — FLUTICASONE PROPIONATE 50 MCG/ACT NA SUSP: 2.0000 | Freq: Every day | NASAL | 6 refills | Status: DC

## 2016-10-03 NOTE — Progress Notes (Signed)
S: C/o runny nose and congestion for 5 days, no fever, chills, cp/sob, v/d; mucus is green and thick, cough is sporadic, c/o of facial and dental pain.   Using otc meds: dayquil and nyquil  O: PE: vitals wnl, nad, perrl eomi, normocephalic, tms dull, nasal mucosa red and swollen, throat injected, neck supple no lymph, thyroid gland feels enlarged; lungs c t a, cv rrr, neuro intact  A:  Acute sinusitis   P: drink fluids, continue regular meds , use otc meds of choice, return if not improving in 5 days, return earlier if worsening , omnicef, flonase, tsh panel, last tsh was level was low, pt eating sea salt, told her to switch iodized salt

## 2016-10-04 ENCOUNTER — Encounter: Payer: Self-pay | Admitting: Emergency Medicine

## 2016-10-04 LAB — THYROID PANEL WITH TSH
FREE THYROXINE INDEX: 2.4 (ref 1.2–4.9)
T3 UPTAKE RATIO: 27 % (ref 24–39)
T4 TOTAL: 8.9 ug/dL (ref 4.5–12.0)
TSH: 1.14 u[IU]/mL (ref 0.450–4.500)

## 2017-04-01 ENCOUNTER — Ambulatory Visit: Payer: Self-pay | Admitting: Family

## 2017-04-01 VITALS — BP 119/70 | HR 96 | Temp 98.6°F

## 2017-04-01 DIAGNOSIS — J019 Acute sinusitis, unspecified: Secondary | ICD-10-CM

## 2017-04-01 MED ORDER — AMOXICILLIN 875 MG PO TABS: 875.0000 mg | ORAL_TABLET | Freq: Two times a day (BID) | ORAL | 0 refills | Status: DC

## 2017-04-01 MED ORDER — FLUCONAZOLE 150 MG PO TABS: ORAL_TABLET | ORAL | 0 refills | Status: DC

## 2017-04-01 NOTE — Progress Notes (Signed)
S/ head /nasal congestion, ST ,fatigue , blowing green  O/ mildly ill ENT : copious mucopurulent rhinorhea, + max facial tenderness, throat clear, Neck supple Heart rsr lungs clear A/ acute rhinosinusitis  P amox, Supportive measures discussed. Follow up prn not improving    Nasal saline products bid and prn

## 2017-06-27 ENCOUNTER — Emergency Department
Admission: EM | Admit: 2017-06-27 | Discharge: 2017-06-27 | Disposition: A | Discharge status: Home / Self Care | Payer: Worker's Compensation | Attending: Emergency Medicine | Admitting: Emergency Medicine

## 2017-06-27 ENCOUNTER — Emergency Department: Payer: Worker's Compensation

## 2017-06-27 ENCOUNTER — Encounter: Payer: Self-pay | Admitting: Medical Oncology

## 2017-06-27 DIAGNOSIS — Z79899 Other long term (current) drug therapy: Secondary | ICD-10-CM | POA: Insufficient documentation

## 2017-06-27 DIAGNOSIS — Y939 Activity, unspecified: Secondary | ICD-10-CM | POA: Diagnosis not present

## 2017-06-27 DIAGNOSIS — Y929 Unspecified place or not applicable: Secondary | ICD-10-CM | POA: Diagnosis not present

## 2017-06-27 DIAGNOSIS — Z23 Encounter for immunization: Secondary | ICD-10-CM | POA: Insufficient documentation

## 2017-06-27 DIAGNOSIS — Y999 Unspecified external cause status: Secondary | ICD-10-CM | POA: Insufficient documentation

## 2017-06-27 DIAGNOSIS — T148XXA Other injury of unspecified body region, initial encounter: Secondary | ICD-10-CM

## 2017-06-27 DIAGNOSIS — S61215A Laceration without foreign body of left ring finger without damage to nail, initial encounter: Secondary | ICD-10-CM | POA: Diagnosis not present

## 2017-06-27 DIAGNOSIS — W260XXA Contact with knife, initial encounter: Secondary | ICD-10-CM | POA: Insufficient documentation

## 2017-06-27 DIAGNOSIS — S6492XA Injury of unspecified nerve at wrist and hand level of left arm, initial encounter: Secondary | ICD-10-CM | POA: Insufficient documentation

## 2017-06-27 MED ORDER — LIDOCAINE-EPINEPHRINE-TETRACAINE (LET) SOLUTION
3.0000 mL | Freq: Once | NASAL | Status: AC
  Administered 2017-06-27: 3 mL via TOPICAL
  Filled 2017-06-27: qty 3

## 2017-06-27 MED ORDER — TETANUS-DIPHTH-ACELL PERTUSSIS 5-2.5-18.5 LF-MCG/0.5 IM SUSP
0.5000 mL | Freq: Once | INTRAMUSCULAR | Status: AC
  Administered 2017-06-27: 0.5 mL via INTRAMUSCULAR
  Filled 2017-06-27: qty 0.5

## 2017-06-27 NOTE — ED Notes (Signed)
My understanding is that nothing is needed for workers comp. So nothing was done. LM EDT

## 2017-06-27 NOTE — ED Provider Notes (Signed)
[facility] Emergency Department Provider Note  ____________________________________________  Time seen: Approximately 10:53 AM  I have reviewed the triage vital signs and the nursing notes.   HISTORY  Chief Complaint Laceration    HPI Leah Hartman is a 21 y.o. female that presents to the emergency department with left finger laceration. She states that the knife went straight into the base of her finger. She is not having any difficulty moving her finger but her finger feels numb. She denies any additional trauma. Tetanus shot is not up-to-date. No shortness breath, chest pain, nausea, vomiting, abdominal pain.   Past Medical History:  Diagnosis Date  . Abdominal pain   . Dysmenorrhea 05/10/2015  . Endometriosis   . GERD (gastroesophageal reflux disease)    heartburn   . Nausea & vomiting   . Ovarian cyst   . Ovarian cyst rupture   . POTS (postural orthostatic tachycardia syndrome)   . Syncope and collapse     Patient Active Problem List   Diagnosis Date Noted  . Endometriosis 07/19/2015  . Personal history of perinatal problems 07/04/2015  . Chronic diarrhea 05/26/2015  . Chronic pelvic pain in female 05/10/2015  . Family history of endometriosis in first degree relative 05/10/2015  . Dysmenorrhea 05/10/2015  . Menorrhagia 05/10/2015  . Syncope 01/31/2015  . Chronic fatigue 01/31/2015  . Nausea and vomiting 01/31/2015  . SOB (shortness of breath) 09/27/2014  . PSVT (paroxysmal supraventricular tachycardia) (HCC) 09/27/2014    Past Surgical History:  Procedure Laterality Date  . APPENDECTOMY    . ESOPHAGOGASTRODUODENOSCOPY N/A 04/07/2015   Procedure: ESOPHAGOGASTRODUODENOSCOPY (EGD);  Surgeon: Midge Miniumarren Wohl, MD;  [facility];  Service: Gastroenterology;  Laterality: N/A;  . INTRAUTERINE DEVICE (IUD) INSERTION N/A 05/15/2015   Procedure: INTRAUTERINE DEVICE (IUD) INSERTION;  Surgeon: Herold HarmsMartin A Defrancesco, MD;  Location:  [facility] ORS;  Service: Gynecology;  Laterality: N/A;  . LAPAROSCOPY N/A 05/15/2015   Procedure: with excision and fulgeration of endometriosis;  Surgeon: Herold HarmsMartin A Defrancesco, MD;  Location: [facility] ORS;  Service: Gynecology;  Laterality: N/A;  with excision and fulgeration of endometriosis  . OVARIAN CYST SURGERY Bilateral   . TONSILLECTOMY AND ADENOIDECTOMY    . TYMPANOSTOMY TUBE PLACEMENT      Prior to Admission medications   Medication Sig Start Date End Date Taking? Authorizing Provider  amoxicillin (AMOXIL) 875 MG tablet Take 1 tablet (875 mg total) by mouth 2 (two) times daily. 04/01/17   Francesco SorMoore, Tommie Anne, NP  cefdinir (OMNICEF) 300 MG capsule Take 1 capsule (300 mg total) by mouth 2 (two) times daily. Patient not taking: Reported on 04/01/2017 10/03/16   Faythe GheeFisher, Susan W, PA-C  fluconazole (DIFLUCAN) 150 MG tablet Take one at onset of symptoms. May repeat in 2-3 days prn. 04/01/17   Francesco SorMoore, Tommie Anne, NP  fluticasone [facility](FLONASE) 50 MCG/ACT nasal spray Place 2 sprays into both nostrils daily. Patient not taking: Reported on 04/01/2017 10/03/16   Faythe GheeFisher, Susan W, PA-C  norgestimate-ethinyl estradiol (ORTHO-CYCLEN,SPRINTEC,PREVIFEM) 0.25-35 MG-MCG tablet Take by mouth. 02/22/16   [provider]    Allergies Patient has no known allergies.  Family History  Problem Relation Age of Onset  . Hypertension Mother   . Diabetes Father   . Colon cancer Neg Hx   . Ovarian cancer Neg Hx   . Breast cancer Neg Hx     Social History Social History  Substance Use Topics  . Smoking status: Never Smoker  . Smokeless tobacco: Never Used  . Alcohol  use No     Review of Systems  Constitutional: No fever/chills Cardiovascular: No chest pain. Respiratory: No SOB. Gastrointestinal: No abdominal pain.  No nausea, no vomiting.  Skin: Negative for rash, abrasions, ecchymosis. Neurological: Negative for headaches   ____________________________________________   PHYSICAL EXAM:  VITAL  SIGNS: ED Triage Vitals  Enc Vitals Group     BP 06/27/17 1009 119/86     Pulse Rate 06/27/17 1009 87     Resp --      Temp 06/27/17 1009 98.5 F (36.9 C)     Temp Source 06/27/17 1009 Oral     SpO2 06/27/17 1009 99 %     Weight 06/27/17 1001 188 lb (85.3 kg)     Height --      Head Circumference --      Peak Flow --      Pain Score 06/27/17 1001 5     Pain Loc --      Pain Edu? --      Excl. in GC? --      Constitutional: Alert and oriented. Well appearing and in no acute distress. Eyes: Conjunctivae are normal. PERRL. EOMI. Head: Atraumatic. ENT:      Ears:      Nose: No congestion/rhinnorhea.      Mouth/Throat: Mucous membranes are moist.  Neck: No stridor.  Cardiovascular: Normal rate, regular rhythm.  Good peripheral circulation. 2+ cap refill. Respiratory: Normal respiratory effort without tachypnea or retractions. Lungs CTAB. Good air entry to the bases with no decreased or absent breath sounds. Musculoskeletal: Full range of motion to all extremities. No gross deformities appreciated. Strength 5 out of 5 in left ring finger. Neurologic:  Normal speech and language. No gross focal neurologic deficits are appreciated.  Skin:  Skin is warm, dry. 1/4 cm laceration to base of left ring finger on palmer side.  ____________________________________________   LABS (all labs ordered are listed, but only abnormal results are displayed)  Labs Reviewed - No data to display ____________________________________________  EKG   ____________________________________________  RADIOLOGY Lexine BatonI, Symeon Puleo, personally viewed and evaluated these images (plain radiographs) as part of my medical decision making, as well as reviewing the written report by the radiologist  Dg Finger Ring Left  Result Date: 06/27/2017 CLINICAL DATA:  Left ring finger laceration.  Initial encounter. EXAM: LEFT RING FINGER 2+V COMPARISON:  None. FINDINGS: No acute fracture, dislocation, or radiopaque  foreign body is identified. Bone mineralization appears normal. Soft tissues are unremarkable. IMPRESSION: Negative. Electronically Signed   By: Sebastian AcheAllen  Grady M.D.   On: 06/27/2017 11:12    ____________________________________________    PROCEDURES  Procedure(s) performed:    Procedures  LACERATION REPAIR Performed by: Sheran LawlessMadeline PA-S  Consent: Verbal consent obtained.  Consent given by: patient  Prepped and Draped in normal sterile fashion  Wound explored: No foreign bodies   Laceration Location: right ring finger  Laceration Length: 1/4 cm  Anesthesia: None  Local anesthetic: LET  Irrigation method: syringe  Skin closure: 4-0 nylon  Number of sutures: 1  Technique: Simple interrupted  Patient tolerance: Patient tolerated the procedure well with no immediate complications.  Medications  Tdap (BOOSTRIX) injection 0.5 mL (0.5 mLs Intramuscular Given 06/27/17 1105)  lidocaine-EPINEPHrine-tetracaine (LET) solution (3 mLs Topical Given by Other 06/27/17 1136)     ____________________________________________   INITIAL IMPRESSION / ASSESSMENT AND PLAN / ED COURSE  Pertinent labs & imaging results that were available during my care of the patient were reviewed by me and  considered in my medical decision making (see chart for details).  Review of the [place] CSRS was performed in accordance of the [facility] prior to dispensing any controlled drugs.   She presented to the emergency department for evaluation of finger laceration. Patients finger is numb and she appears to have severed the nerve. She has good blood flow and strength to the finger. I called [facility] and she was able to get an appointment tomorrow with Dr. Dayna Barker at 10:30 AM. Laceration was repaired by Sheran Lawless PA-S. Was applied. Patient is given ED precautions to return to the ED for any worsening or new symptoms.     ____________________________________________  FINAL CLINICAL IMPRESSION(S) / ED  DIAGNOSES  Final diagnoses:  Laceration of left ring finger without foreign body without damage to nail, initial encounter  Nerve injury      NEW MEDICATIONS STARTED DURING THIS VISIT:  Discharge Medication List as of 06/27/2017 12:17 PM          This chart was dictated using voice recognition software/Dragon. Despite best efforts to proofread, errors can occur which can change the meaning. Any change was purely unintentional.    Enid Derry, PA-C 06/27/17 1550    Don Perking, [place], MD 06/27/17 [phone]

## 2017-06-27 NOTE — ED Notes (Signed)
Pt verbalized understanding of discharge instructions. NAD at this time. 

## 2017-06-27 NOTE — ED Triage Notes (Signed)
Puncture Laceration to left hand ring finger.

## 2017-07-01 ENCOUNTER — Ambulatory Visit: Payer: Self-pay | Admitting: Physician Assistant

## 2017-07-11 ENCOUNTER — Encounter: Payer: Self-pay | Admitting: Physician Assistant

## 2017-07-11 ENCOUNTER — Ambulatory Visit: Payer: Self-pay | Admitting: Physician Assistant

## 2017-07-11 VITALS — BP 117/69 | HR 90 | Temp 98.5°F | Resp 16

## 2017-07-11 DIAGNOSIS — Z4889 Encounter for other specified surgical aftercare: Secondary | ICD-10-CM

## 2017-07-11 DIAGNOSIS — L719 Rosacea, unspecified: Secondary | ICD-10-CM

## 2017-07-11 NOTE — Progress Notes (Signed)
I called [facility] to inquiry about sending a referral for the patient to be seen and I was told that she doesn't need a referral.

## 2017-07-11 NOTE — Progress Notes (Signed)
S: needs a referral to dermatology, says she thinks she has rosacea, her cheeks are red a lot, gets worse when heated, upset, or anxious, also ?if we could make sure her hand isn't infected from recent surgery, no fever/chills, some swelling  O: vitals wnl, nad, sutures intact, suture line appears normal, hand is a little swollen but there is not redness or increased warmth, skin on face is slightly red at the cheeks, no pustules or broken vesicles  A: ?rosacea, wound check  P: will call dermatolgy, they already made appt but will see if they actually need referral, f/u with surgeon for hand

## 2017-07-24 ENCOUNTER — Emergency Department
Admission: EM | Admit: 2017-07-24 | Discharge: 2017-07-24 | Disposition: A | Discharge status: Home / Self Care | Payer: Managed Care, Other (non HMO) | Attending: Student in an Organized Health Care Education/Training Program | Admitting: Student in an Organized Health Care Education/Training Program

## 2017-07-24 ENCOUNTER — Encounter: Payer: Self-pay | Admitting: Emergency Medicine

## 2017-07-24 ENCOUNTER — Emergency Department: Payer: Managed Care, Other (non HMO)

## 2017-07-24 DIAGNOSIS — Z79899 Other long term (current) drug therapy: Secondary | ICD-10-CM | POA: Insufficient documentation

## 2017-07-24 DIAGNOSIS — R102 Pelvic and perineal pain: Secondary | ICD-10-CM | POA: Insufficient documentation

## 2017-07-24 DIAGNOSIS — R58 Hemorrhage, not elsewhere classified: Secondary | ICD-10-CM

## 2017-07-24 DIAGNOSIS — Z8742 Personal history of other diseases of the female genital tract: Secondary | ICD-10-CM | POA: Diagnosis not present

## 2017-07-24 DIAGNOSIS — N939 Abnormal uterine and vaginal bleeding, unspecified: Secondary | ICD-10-CM | POA: Insufficient documentation

## 2017-07-24 LAB — CHLAMYDIA/NGC RT PCR (ARMC ONLY)
CHLAMYDIA TR: NOT DETECTED
N GONORRHOEAE: NOT DETECTED

## 2017-07-24 LAB — URINALYSIS, COMPLETE (UACMP) WITH MICROSCOPIC
Bacteria, UA: NONE SEEN
Bilirubin Urine: NEGATIVE
GLUCOSE, UA: NEGATIVE mg/dL
Ketones, ur: NEGATIVE mg/dL
Leukocytes, UA: NEGATIVE
Nitrite: NEGATIVE
PH: 6 (ref 5.0–8.0)
PROTEIN: NEGATIVE mg/dL
SPECIFIC GRAVITY, URINE: 1.019 (ref 1.005–1.030)

## 2017-07-24 LAB — WET PREP, GENITAL
Clue Cells Wet Prep HPF POC: NONE SEEN
Sperm: NONE SEEN
Trich, Wet Prep: NONE SEEN
Yeast Wet Prep HPF POC: NONE SEEN

## 2017-07-24 LAB — BASIC METABOLIC PANEL
ANION GAP: 8 (ref 5–15)
BUN: 12 mg/dL (ref 6–20)
CHLORIDE: 105 mmol/L (ref 101–111)
CO2: 25 mmol/L (ref 22–32)
Calcium: 9.4 mg/dL (ref 8.9–10.3)
Creatinine, Ser: 0.65 mg/dL (ref 0.44–1.00)
GFR calc Af Amer: >60 mL/min (ref >60)
GLUCOSE: 101 mg/dL — AB (ref 65–99)
POTASSIUM: 4 mmol/L (ref 3.5–5.1)
Sodium: 138 mmol/L (ref 135–145)

## 2017-07-24 LAB — CBC
HEMATOCRIT: 41.2 % (ref 35.0–47.0)
Hemoglobin: 13.7 g/dL (ref 12.0–16.0)
MCH: 29.9 pg (ref 26.0–34.0)
MCHC: 33.4 g/dL (ref 32.0–36.0)
MCV: 89.7 fL (ref 80.0–100.0)
Platelets: 221 10*3/uL (ref 150–440)
RBC: 4.59 MIL/uL (ref 3.80–5.20)
RDW: 14.4 % (ref 11.5–14.5)
WBC: 9.2 10*3/uL (ref 3.6–11.0)

## 2017-07-24 LAB — POCT PREGNANCY, URINE: PREG TEST UR: NEGATIVE

## 2017-07-24 LAB — TYPE AND SCREEN
ABO/RH(D): A POS
Antibody Screen: NEGATIVE

## 2017-07-24 LAB — HCG, QUANTITATIVE, PREGNANCY

## 2017-07-24 MED ORDER — KETOROLAC TROMETHAMINE 30 MG/ML IJ SOLN
INTRAMUSCULAR | Status: AC
  Filled 2017-07-24: qty 1

## 2017-07-24 MED ORDER — PROMETHAZINE HCL 25 MG/ML IJ SOLN
12.5000 mg | Freq: Four times a day (QID) | INTRAMUSCULAR | Status: DC | PRN
  Administered 2017-07-24: 12.5 mg via INTRAVENOUS
  Filled 2017-07-24: qty 1

## 2017-07-24 MED ORDER — TRANEXAMIC ACID 650 MG PO TABS: 1300.0000 mg | ORAL_TABLET | Freq: Three times a day (TID) | ORAL | 0 refills | Status: AC

## 2017-07-24 MED ORDER — KETOROLAC TROMETHAMINE 30 MG/ML IJ SOLN
15.0000 mg | Freq: Once | INTRAMUSCULAR | Status: AC
  Administered 2017-07-24: 15 mg via INTRAVENOUS

## 2017-07-24 MED ORDER — MORPHINE SULFATE (PF) 4 MG/ML IV SOLN
4.0000 mg | INTRAVENOUS | Status: DC | PRN
  Administered 2017-07-24: 4 mg via INTRAVENOUS
  Filled 2017-07-24: qty 1

## 2017-07-24 NOTE — ED Triage Notes (Signed)
Pt comes into the ED via POv c/o vaginal bleeding.  Patient states she normally has heavy periods but today she is having to change tampons very 15 minutes and where a super pad in addition.  Patient states she is passing clots the size of her thumb.  She called her OBGYN and they instructed her to come here due to possible hemorrhage.  Patient is alert and oriented x4 and in NAD at this time with even and unlabored respirations.  Patient has h/o ruptured ovarian cysts where she had to go for emergency surgery.

## 2017-07-24 NOTE — ED Provider Notes (Signed)
[facility] Emergency Department Provider Note    First MD Initiated Contact with Patient 07/24/17 1520     (approximate)  I have reviewed the triage vital signs and the nursing notes.   HISTORY  Chief Complaint Vaginal Bleeding    HPI Leah Hartman is a 21 y.o. female ith a history of dysmenorrhea and ovarian cyst presents with crampy suprapubic pain and heavy vaginal bleeding that started earlier today. States that her menstrual cycle started on Sunday but he became very heavy today. She's not on any oral contraceptive pills. She did used to have a IUD but that was removed one year ago. She denies any vaginal discharge. States the pain is crampy and severe nature. Denies any history of infections.No family history of bleeding disorders.   Past Medical History:  Diagnosis Date  . Abdominal pain   . Dysmenorrhea 05/10/2015  . Endometriosis   . GERD (gastroesophageal reflux disease)    heartburn   . Nausea & vomiting   . Ovarian cyst   . Ovarian cyst rupture   . POTS (postural orthostatic tachycardia syndrome)   . Syncope and collapse    Family History  Problem Relation Age of Onset  . Hypertension Mother   . Diabetes Father   . Colon cancer Neg Hx   . Ovarian cancer Neg Hx   . Breast cancer Neg Hx    Past Surgical History:  Procedure Laterality Date  . APPENDECTOMY    . ESOPHAGOGASTRODUODENOSCOPY N/A 04/07/2015   Procedure: ESOPHAGOGASTRODUODENOSCOPY (EGD);  Surgeon: Midge Minium, MD;  Location: [facility];  Service: Gastroenterology;  Laterality: N/A;  . INTRAUTERINE DEVICE (IUD) INSERTION N/A 05/15/2015   Procedure: INTRAUTERINE DEVICE (IUD) INSERTION;  Surgeon: Herold Harms, MD;  Location: [facility];  Service: Gynecology;  Laterality: N/A;  . LAPAROSCOPY N/A 05/15/2015   Procedure: with excision and fulgeration of endometriosis;  Surgeon: Herold Harms, MD;  Location: [facility];  Service: Gynecology;  Laterality:  N/A;  with excision and fulgeration of endometriosis  . OVARIAN CYST SURGERY Bilateral   . TONSILLECTOMY AND ADENOIDECTOMY    . TYMPANOSTOMY TUBE PLACEMENT     Patient Active Problem List   Diagnosis Date Noted  . Endometriosis 07/19/2015  . Personal history of perinatal problems 07/04/2015  . Chronic diarrhea 05/26/2015  . Chronic pelvic pain in female 05/10/2015  . Family history of endometriosis in first degree relative 05/10/2015  . Dysmenorrhea 05/10/2015  . Menorrhagia 05/10/2015  . Syncope 01/31/2015  . Chronic fatigue 01/31/2015  . Nausea and vomiting 01/31/2015  . SOB (shortness of breath) 09/27/2014  . PSVT (paroxysmal supraventricular tachycardia) (HCC) 09/27/2014      Prior to Admission medications   Medication Sig Start Date End Date Taking? Authorizing Provider  fluticasone (FLONASE) 50 MCG/ACT nasal spray Place 2 sprays into both nostrils daily. Patient not taking: Reported on 04/01/2017 10/03/16   Faythe Ghee, PA-C  norgestimate-ethinyl estradiol (ORTHO-CYCLEN,SPRINTEC,PREVIFEM) 0.25-35 MG-MCG tablet Take by mouth. 02/22/16   [provider]    Allergies Patient has no known allergies.    Social History Social History  Substance Use Topics  . Smoking status: Never Smoker  . Smokeless tobacco: Never Used  . Alcohol use No    Review of Systems Patient denies headaches, rhinorrhea, blurry vision, numbness, shortness of breath, chest pain, edema, cough, abdominal pain, nausea, vomiting, diarrhea, dysuria, fevers, rashes or hallucinations unless otherwise stated above in HPI. ____________________________________________   PHYSICAL EXAM:  VITAL SIGNS: Vitals:  07/24/17 1423  BP: 120/75  Pulse: 87  Resp: 16  Temp: 98.4 F (36.9 C)  SpO2: 98%    Constitutional: Alert and oriented. Well appearing and in no acute distress. Eyes: Conjunctivae are normal.  Head: Atraumatic. Nose: No congestion/rhinnorhea. Mouth/Throat: Mucous membranes  are moist.   Neck: No stridor. Painless ROM.  Cardiovascular: Normal rate, regular rhythm. Grossly normal heart sounds.  Good peripheral circulation. Respiratory: Normal respiratory effort.  No retractions. Lungs CTAB. Gastrointestinal: Soft and nontender. No distention. No abdominal bruits. No CVA tenderness. Genitourinary: slwo ooze from the cervix, no laceration, masses or purulent discharge Musculoskeletal: No lower extremity tenderness nor edema.  No joint effusions. Neurologic:  Normal speech and language. No gross focal neurologic deficits are appreciated. No facial droop Skin:  Skin is warm, dry and intact. No rash noted. Psychiatric: Mood and affect are normal. Speech and behavior are normal.  ____________________________________________   LABS (all labs ordered are listed, but only abnormal results are displayed)  Results for orders placed or performed during the hospital encounter of 07/24/17 (from the past 24 hour(s))  Basic metabolic panel     Status: Abnormal   Collection Time: 07/24/17  2:23 PM  Result Value Ref Range   Sodium 138 135 - 145 mmol/L   Potassium 4.0 3.5 - 5.1 mmol/L   Chloride 105 101 - 111 mmol/L   CO2 25 22 - 32 mmol/L   Glucose, Bld 101 (H) 65 - 99 mg/dL   BUN 12 6 - 20 mg/dL   Creatinine, Ser 7.42 0.44 - 1.00 mg/dL   Calcium 9.4 8.9 - 59.5 mg/dL   GFR calc non Af Amer >60 >60 mL/min   GFR calc Af Amer >60 >60 mL/min   Anion gap 8 5 - 15  CBC     Status: None   Collection Time: 07/24/17  2:23 PM  Result Value Ref Range   WBC 9.2 3.6 - 11.0 K/uL   RBC 4.59 3.80 - 5.20 MIL/uL   Hemoglobin 13.7 12.0 - 16.0 g/dL   HCT 63.8 75.6 - 43.3 %   MCV 89.7 80.0 - 100.0 fL   MCH 29.9 26.0 - 34.0 pg   MCHC 33.4 32.0 - 36.0 g/dL   RDW 29.5 18.8 - 41.6 %   Platelets 221 150 - 440 K/uL  Type and screen [facility]     Status: None   Collection Time: 07/24/17  2:23 PM  Result Value Ref Range   ABO/RH(D) A POS    Antibody Screen NEG     Sample Expiration 07/27/2017   Pregnancy, urine POC     Status: None   Collection Time: 07/24/17  4:13 PM  Result Value Ref Range   Preg Test, Ur NEGATIVE NEGATIVE   ____________________________________________  EKG____________________________________________  RADIOLOGY  I personally reviewed all radiographic images ordered to evaluate for the above acute complaints and reviewed radiology reports and findings.  These findings were personally discussed with the patient.  Please see medical record for radiology report.  ____________________________________________   PROCEDURES  Procedure(s) performed:  Procedures    Critical Care performed: no ____________________________________________   INITIAL IMPRESSION / ASSESSMENT AND PLAN / ED COURSE  Pertinent labs & imaging results that were available during my care of the patient were reviewed by me and considered in my medical decision making (see chart for details).  DDX: dub, cyst. Ectopic, mass  Leah Hartman is a 21 y.o. who presents to the ED with vaginal bleeding as  described above. No evidence of significant acute blood loss. No evidence of infection. A pelvic exam as above. Ultrasound is reassuring. A spoke with Dr. Dalbert GarnetBeasley of OB/GYN who has agreed with initiating lysteda therapy.  Stable for follow up with ob gyn.  Have discussed with the patient and available family all diagnostics and treatments performed thus far and all questions were answered to the best of my ability. The patient demonstrates understanding and agreement with plan.       ____________________________________________   FINAL CLINICAL IMPRESSION(S) / ED DIAGNOSES  Final diagnoses:  Bleeding  Vaginal bleeding      NEW MEDICATIONS STARTED DURING THIS VISIT:  New Prescriptions   No medications on file     Note:  This document was prepared using Dragon voice recognition software and may include unintentional dictation errors.      Willy Eddyobinson, Lateia Fraser, MD 07/24/17 [phone]

## 2017-08-04 ENCOUNTER — Other Ambulatory Visit: Payer: Self-pay

## 2017-08-04 NOTE — Progress Notes (Signed)
Patient came to get blood drawn for testing per Dr Toma CopierBethany Beasley's orders.  Patient was sent to [facility] to get her blood drawn due to one of the specimens having to be frozen. This office doesn't have the capability to freeze specimens.

## 2017-08-05 LAB — CBC WITH DIFFERENTIAL/PLATELET
BASOS ABS: 0 10*3/uL (ref 0.0–0.2)
Basos: 0 %
EOS (ABSOLUTE): 0 10*3/uL (ref 0.0–0.4)
EOS: 1 %
HEMOGLOBIN: 12.9 g/dL (ref 11.1–15.9)
Hematocrit: 39.2 % (ref 34.0–46.6)
IMMATURE GRANS (ABS): 0 10*3/uL (ref 0.0–0.1)
IMMATURE GRANULOCYTES: 1 %
LYMPHS ABS: 2 10*3/uL (ref 0.7–3.1)
Lymphs: 24 %
MCH: 29.1 pg (ref 26.6–33.0)
MCHC: 32.9 g/dL (ref 31.5–35.7)
MCV: 89 fL (ref 79–97)
MONOCYTES: 7 %
Monocytes Absolute: 0.6 10*3/uL (ref 0.1–0.9)
NEUTROS PCT: 67 %
Neutrophils Absolute: 5.6 10*3/uL (ref 1.4–7.0)
Platelets: 236 10*3/uL (ref 150–379)
RBC: 4.43 x10E6/uL (ref 3.77–5.28)
RDW: 13.8 % (ref 12.3–15.4)
WBC: 8.3 10*3/uL (ref 3.4–10.8)

## 2017-08-05 LAB — PROTIME-INR
INR: 1 (ref 0.8–1.2)
Prothrombin Time: 10.6 s (ref 9.1–12.0)

## 2017-08-05 LAB — VON WILLEBRAND PANEL
Factor VIII Activity: 116 % (ref 57–163)
VON WILLEBRAND AG: 99 % (ref 50–200)
Von Willebrand Factor: 99 % (ref 50–200)

## 2017-08-05 LAB — COAG STUDIES INTERP REPORT

## 2017-08-06 NOTE — Progress Notes (Signed)
Lab results were fax[facility] to [facility] per patient's request.

## 2017-09-09 ENCOUNTER — Ambulatory Visit: Payer: Self-pay | Admitting: Physician Assistant

## 2017-09-09 VITALS — BP 125/80 | HR 96 | Temp 98.5°F | Resp 16

## 2017-09-09 DIAGNOSIS — N390 Urinary tract infection, site not specified: Secondary | ICD-10-CM

## 2017-09-09 LAB — POCT URINALYSIS DIPSTICK
Bilirubin, UA: NEGATIVE
Glucose, UA: NEGATIVE
Ketones, UA: NEGATIVE
NITRITE UA: NEGATIVE
PH UA: 5.5 (ref 5.0–8.0)
PROTEIN UA: NEGATIVE
Spec Grav, UA: 1.025 (ref 1.010–1.025)
UROBILINOGEN UA: 0.2 U/dL

## 2017-09-09 MED ORDER — NITROFURANTOIN MONOHYD MACRO 100 MG PO CAPS: 100.0000 mg | ORAL_CAPSULE | Freq: Two times a day (BID) | ORAL | 0 refills | Status: DC

## 2017-09-09 NOTE — Progress Notes (Signed)
S:  C/o uti sx for 2 -3 days, burning, urgency, frequency, denies fever, chills, vaginal discharge, abdominal pain or flank pain:  Remainder ros neg  O:  Vitals wnl, nad, no cva tenderness, back nontender, lungs c t a,cv rrr, , n/v intact, ua trace leuks, 1+ blood  A: uti  P: macrobid  bid x 7d, increase water intake, add cranberry juice, return if not improving in 2 -3 days, return earlier if worsening, discussed pyelonephritis sx

## 2017-09-11 LAB — URINE CULTURE

## 2017-10-23 ENCOUNTER — Encounter: Payer: Self-pay | Admitting: Family

## 2017-10-23 ENCOUNTER — Ambulatory Visit: Payer: Self-pay | Admitting: Family

## 2017-10-23 VITALS — BP 100/65 | HR 107 | Temp 98.2°F | Resp 16

## 2017-10-23 DIAGNOSIS — E0789 Other specified disorders of thyroid: Secondary | ICD-10-CM

## 2017-10-23 DIAGNOSIS — J019 Acute sinusitis, unspecified: Secondary | ICD-10-CM

## 2017-10-23 MED ORDER — FLUCONAZOLE 150 MG PO TABS: ORAL_TABLET | ORAL | 0 refills | Status: DC

## 2017-10-23 MED ORDER — AZITHROMYCIN 250 MG PO TABS: ORAL_TABLET | ORAL | 0 refills | Status: DC

## 2017-10-23 NOTE — Addendum Note (Signed)
Addended by: Catha BrowEACON, MONIQUE T on: 10/23/2017 03:47 PM   Modules accepted: Orders

## 2017-10-23 NOTE — Progress Notes (Signed)
S:three-week history of cold congestion, blowing green facial pressure , cough , hot and cold , low-grade TM rrecorded last night ; also complains of trouble swallowing at times, hoarseness  at times and sister and father with thyroid disease  O vital signs stable mildly  ill-appearing ENT nasal mucosa swollen red excoriated with purulent rhinorrhea and tenderness to palpation maxillary and frontal sinuses , neck supple thyroidis palpable seems full , nontender heart RSR lungs are clear A/ acute rhinosinusitis , palpable thyroid   P /: Rx Z-Pak and Diflucan. Supportive measures discussed thyroid profile obtained today and will follow-up accordingly

## 2017-10-24 LAB — THYROID PANEL WITH TSH
Free Thyroxine Index: 1.9 (ref 1.2–4.9)
T3 UPTAKE RATIO: 25 % (ref 24–39)
T4, Total: 7.7 ug/dL (ref 4.5–12.0)
TSH: 1.4 u[IU]/mL (ref 0.450–4.500)

## 2017-11-26 ENCOUNTER — Ambulatory Visit: Payer: Self-pay | Admitting: Family

## 2017-11-26 VITALS — BP 125/83 | HR 100 | Temp 96.2°F | Resp 16

## 2017-11-26 DIAGNOSIS — J069 Acute upper respiratory infection, unspecified: Secondary | ICD-10-CM

## 2017-11-26 MED ORDER — BENZONATATE 200 MG PO CAPS: 200.0000 mg | ORAL_CAPSULE | Freq: Two times a day (BID) | ORAL | 0 refills | Status: DC | PRN

## 2017-11-26 NOTE — Progress Notes (Signed)
S/: To 3 day history of scratchy throat, hoarseness, nasal congestion, postnasal drainage, cough ,worse at night with difficulty sleeping, denies fever body aches or GI symptoms, taking Delsym and using lozenges  O/: Vital signs stable ENT: TMs retracted dull nasal mucosa erythematous and edematous,-TTP to palpation of the sinuses, pharynx clear, neck supple without adenopathy, heart RSR without murmur or gallop, lungs are clear  A/: Upper respiratory infection P/: viral course discussed and supportive measures encouraged. Supportive measures discussed. Follow up prn not improving   rx tessalon pearles .

## 2017-12-23 ENCOUNTER — Ambulatory Visit: Payer: Self-pay | Admitting: Physician Assistant

## 2017-12-23 ENCOUNTER — Encounter: Payer: Self-pay | Admitting: Emergency Medicine

## 2017-12-23 ENCOUNTER — Emergency Department: Payer: Managed Care, Other (non HMO)

## 2017-12-23 ENCOUNTER — Other Ambulatory Visit: Payer: Self-pay

## 2017-12-23 ENCOUNTER — Encounter: Payer: Self-pay | Admitting: Physician Assistant

## 2017-12-23 ENCOUNTER — Emergency Department
Admission: EM | Admit: 2017-12-23 | Discharge: 2017-12-23 | Disposition: A | Discharge status: Home / Self Care | Payer: Managed Care, Other (non HMO) | Attending: Emergency Medicine | Admitting: Emergency Medicine

## 2017-12-23 VITALS — BP 110/70 | HR 106 | Temp 98.4°F | Resp 16

## 2017-12-23 DIAGNOSIS — G4489 Other headache syndrome: Secondary | ICD-10-CM

## 2017-12-23 DIAGNOSIS — W1839XA Other fall on same level, initial encounter: Secondary | ICD-10-CM | POA: Insufficient documentation

## 2017-12-23 DIAGNOSIS — Y9389 Activity, other specified: Secondary | ICD-10-CM | POA: Insufficient documentation

## 2017-12-23 DIAGNOSIS — R0789 Other chest pain: Secondary | ICD-10-CM | POA: Diagnosis present

## 2017-12-23 DIAGNOSIS — R55 Syncope and collapse: Secondary | ICD-10-CM | POA: Insufficient documentation

## 2017-12-23 DIAGNOSIS — Y92230 Patient room in hospital as the place of occurrence of the external cause: Secondary | ICD-10-CM | POA: Diagnosis not present

## 2017-12-23 DIAGNOSIS — R51 Headache: Secondary | ICD-10-CM | POA: Insufficient documentation

## 2017-12-23 DIAGNOSIS — Y999 Unspecified external cause status: Secondary | ICD-10-CM | POA: Diagnosis not present

## 2017-12-23 DIAGNOSIS — H538 Other visual disturbances: Secondary | ICD-10-CM | POA: Insufficient documentation

## 2017-12-23 LAB — BASIC METABOLIC PANEL
Anion gap: 7 (ref 5–15)
BUN: 15 mg/dL (ref 6–20)
CO2: 25 mmol/L (ref 22–32)
CREATININE: 0.78 mg/dL (ref 0.44–1.00)
Calcium: 9.3 mg/dL (ref 8.9–10.3)
Chloride: 106 mmol/L (ref 101–111)
GFR calc Af Amer: >60 mL/min (ref >60)
Glucose, Bld: 99 mg/dL (ref 65–99)
Potassium: 3.9 mmol/L (ref 3.5–5.1)
SODIUM: 138 mmol/L (ref 135–145)

## 2017-12-23 LAB — PREGNANCY, URINE: PREG TEST UR: NEGATIVE

## 2017-12-23 LAB — CBC
HCT: 41.3 % (ref 35.0–47.0)
Hemoglobin: 13.7 g/dL (ref 12.0–16.0)
MCH: 29.4 pg (ref 26.0–34.0)
MCHC: 33.3 g/dL (ref 32.0–36.0)
MCV: 88.2 fL (ref 80.0–100.0)
PLATELETS: 226 10*3/uL (ref 150–440)
RBC: 4.68 MIL/uL (ref 3.80–5.20)
RDW: 14 % (ref 11.5–14.5)
WBC: 8.1 10*3/uL (ref 3.6–11.0)

## 2017-12-23 LAB — TROPONIN I: Troponin I: <0.03 ng/mL (ref <0.03)

## 2017-12-23 LAB — FIBRIN DERIVATIVES D-DIMER (ARMC ONLY): Fibrin derivatives D-dimer (ARMC): 303.17 ng/mL (FEU) (ref 0.00–499.00)

## 2017-12-23 MED ORDER — ONDANSETRON HCL 4 MG/2ML IJ SOLN
INTRAMUSCULAR | Status: AC
  Administered 2017-12-23: 4 mg via INTRAVENOUS
  Filled 2017-12-23: qty 2

## 2017-12-23 MED ORDER — MORPHINE SULFATE (PF) 2 MG/ML IV SOLN
2.0000 mg | Freq: Once | INTRAVENOUS | Status: AC
  Administered 2017-12-23: 2 mg via INTRAVENOUS

## 2017-12-23 MED ORDER — KETOROLAC TROMETHAMINE 30 MG/ML IJ SOLN
INTRAMUSCULAR | Status: AC
  Administered 2017-12-23: 30 mg via INTRAVENOUS
  Filled 2017-12-23: qty 1

## 2017-12-23 MED ORDER — SODIUM CHLORIDE 0.9 % IV SOLN
1000.0000 mL | Freq: Once | INTRAVENOUS | Status: AC
  Administered 2017-12-23: 1000 mL via INTRAVENOUS

## 2017-12-23 MED ORDER — KETOROLAC TROMETHAMINE 30 MG/ML IJ SOLN
30.0000 mg | Freq: Once | INTRAMUSCULAR | Status: AC
  Administered 2017-12-23: 30 mg via INTRAVENOUS

## 2017-12-23 MED ORDER — ONDANSETRON HCL 4 MG/2ML IJ SOLN
4.0000 mg | Freq: Once | INTRAMUSCULAR | Status: AC
  Administered 2017-12-23: 4 mg via INTRAVENOUS

## 2017-12-23 MED ORDER — ACETAMINOPHEN 500 MG PO TABS
1000.0000 mg | ORAL_TABLET | Freq: Once | ORAL | Status: AC
  Administered 2017-12-23: 1000 mg via ORAL
  Filled 2017-12-23: qty 2

## 2017-12-23 MED ORDER — MORPHINE SULFATE (PF) 2 MG/ML IV SOLN
INTRAVENOUS | Status: AC
  Administered 2017-12-23: 2 mg via INTRAVENOUS
  Filled 2017-12-23: qty 1

## 2017-12-23 MED ORDER — SODIUM CHLORIDE 0.9 % IV BOLUS (SEPSIS)
1000.0000 mL | Freq: Once | INTRAVENOUS | Status: AC
  Administered 2017-12-23: 1000 mL via INTRAVENOUS

## 2017-12-23 NOTE — ED Notes (Signed)
Mother called out from the BR in cpod hall, states after pt washed her hands at the sink, mother states she turned facing away from the the pt and next thing the pt has a syncopal episode falling back and hitting her head on the hard tile floor, this nurse goes immediately to the BR where the pt is on the floor on her back, immediately wakes. Denies any neck, back or extremity pain, pt c/o posterior head pain. EDP Sharma CovertNorman called to assist and brings stretcher outside the BR , the pt is assisted into a sitting position before assisted x3 to standing position and ambulated to the stretcher. Pt given an ice pack for pain. CT head ordered by EDP. Pt has a hx of POTS with multiple concussions per mother.

## 2017-12-23 NOTE — ED Provider Notes (Addendum)
This patient was signed out to me by Dr. Weyman Pedroobert Kanter.  She is a 22 year old female with a history of POTS who presented for chest pain.  His initial workup has been reassuring with a reassuring EKG, negative troponin, normal white blood cell count, negative pregnancy test, normal electrolytes and a chest x-ray does not have any acute process.  She has been given Toradol for her pain.  She was continuing to have pain and was dosed with 2 mg of morphine.  She then ambulated to the bathroom and had a syncopal episode typical of her prior syncopal episodes, according to her mother who witnessed the fall.  She did strike the back of her head and has a mild headache.  She has no neck pain and no midline C-spine tenderness to palpation, step-offs or deformities on my exam.  She has no focal neurologic deficits.  I have ordered a CT of the head, additional intravenous fluid, and will plan to reassess the patient for final disposition.  ----------------------------------------- 6:54 PM on 12/23/2017 -----------------------------------------  The patient's CT head is reassuring without any evidence of injury from her fall.  She is receiving fluids at this time.  We will give her Tylenol for her chest pain.  I have added a d-dimer to her studies.  Plan reevaluation for final disposition.   Rockne MenghiniNorman, Anne-Caroline, MD 12/23/17 1749    Rockne MenghiniNorman, Anne-Caroline, MD 12/23/17 [phone]

## 2017-12-23 NOTE — ED Triage Notes (Signed)
Pt to ed with c/o chest pain and blurred vision that started at 0830 yesterday.  Pt reports hx of POTS.

## 2017-12-23 NOTE — Progress Notes (Signed)
   Subjective: Headache and blurry vision    Patient ID: Leah Hartman, female    DOB: 03/04/1996, [age] y.o.   MRN: [id number]  HPI Patient complaining of frontal headache, blurry vision, chest and back pain. "Feels like passing out".  Onset of complaint yesterday morning.  Patient stated nausea without vomiting diarrhea.  Patient is here with mother.  Patient is requesting IV hydration due to fatigue and decreased appetite.   Review of Systems Unremarkable    Objective:   Physical Exam Patient appears in no acute distress.  Vital signs are stable.  Moist mucous membranes and HEENT was unremarkable.  Neck is supple without adenopathy.  Lungs are clear to auscultation heart regular rate and rhythm.  Patient has negative Romberg and gait is normal.       Assessment & Plan: Headache  Advised patient and mother this complaint needs further evaluation and a higher [place] of care.  Contacted the emergency room and patient will be follow-up for definitive evaluation and treatment

## 2017-12-23 NOTE — ED Provider Notes (Signed)
[facility] Emergency Department Provider Note   ____________________________________________    I have reviewed the triage vital signs and the nursing notes.   HISTORY  Chief Complaint Chest Pain     HPI Leah Hartman is a 22 y.o. female who complains of chest pain and intermittent blurred vision which she states started yesterday morning.  She describes an aching in her central chest.  She denies shortness of breath.  No fevers or chills or cough.  She states she has a history of POTS.  Mother reports that yesterday they felt like this was likely an exacerbation of her POTS disease and treated symptomatically but because it continued today they decided to come to the emergency department.  No recent travel.  No calf pain or swelling.  No nausea or vomiting or diaphoresis.  She does have history of GERD but this does not feel the same.   Past Medical History:  Diagnosis Date  . Abdominal pain   . Dysmenorrhea 05/10/2015  . Endometriosis   . GERD (gastroesophageal reflux disease)    heartburn   . Nausea & vomiting   . Ovarian cyst   . Ovarian cyst rupture   . POTS (postural orthostatic tachycardia syndrome)   . Syncope and collapse     Patient Active Problem List   Diagnosis Date Noted  . Endometriosis 07/19/2015  . Personal history of perinatal problems 07/04/2015  . Chronic diarrhea 05/26/2015  . Chronic pelvic pain in female 05/10/2015  . Family history of endometriosis in first degree relative 05/10/2015  . Dysmenorrhea 05/10/2015  . Menorrhagia 05/10/2015  . Syncope 01/31/2015  . Chronic fatigue 01/31/2015  . Nausea and vomiting 01/31/2015  . SOB (shortness of breath) 09/27/2014  . PSVT (paroxysmal supraventricular tachycardia) (HCC) 09/27/2014    Past Surgical History:  Procedure Laterality Date  . APPENDECTOMY    . ESOPHAGOGASTRODUODENOSCOPY N/A 04/07/2015   Procedure: ESOPHAGOGASTRODUODENOSCOPY (EGD);  Surgeon: Midge Miniumarren  Wohl, MD;  [facility];  Service: Gastroenterology;  Laterality: N/A;  . INTRAUTERINE DEVICE (IUD) INSERTION N/A 05/15/2015   Procedure: INTRAUTERINE DEVICE (IUD) INSERTION;  Surgeon: Herold HarmsMartin A Defrancesco, MD;  Location: [facility];  Service: Gynecology;  Laterality: N/A;  . LAPAROSCOPY N/A 05/15/2015   Procedure: with excision and fulgeration of endometriosis;  Surgeon: Herold HarmsMartin A Defrancesco, MD;  Location: [facility];  Service: Gynecology;  Laterality: N/A;  with excision and fulgeration of endometriosis  . OVARIAN CYST SURGERY Bilateral   . TONSILLECTOMY AND ADENOIDECTOMY    . TYMPANOSTOMY TUBE PLACEMENT      Prior to Admission medications   Not on File     Allergies Patient has no known allergies.  Family History  Problem Relation Age of Onset  . Hypertension Mother   . Diabetes Father   . Colon cancer Neg Hx   . Ovarian cancer Neg Hx   . Breast cancer Neg Hx     Social History Social History   Tobacco Use  . Smoking status: Never Smoker  . Smokeless tobacco: Never Used  Substance Use Topics  . Alcohol use: No    Alcohol/week: 0.0 oz  . Drug use: No    Review of Systems  Constitutional: No fever/chills Eyes: No visual changes.  ENT: No sore throat. Cardiovascular: Aching chest pain as above Respiratory: Denies shortness of breath. Gastrointestinal: No abdominal pain.   Genitourinary: Negative for dysuria. Musculoskeletal: Negative for back pain. Skin: Negative for rash. Neurological: Negative for headaches    ____________________________________________  PHYSICAL EXAM:  VITAL SIGNS: ED Triage Vitals  Enc Vitals Group     BP 12/23/17 1343 117/66     Pulse Rate 12/23/17 1343 88     Resp 12/23/17 1343 16     Temp 12/23/17 1343 98.5 F (36.9 C)     Temp Source 12/23/17 1343 Oral     SpO2 12/23/17 1343 98 %     Weight 12/23/17 1341 90.7 kg (200 lb)     Height 12/23/17 1341 1.753 m (5\' 9" )     Head Circumference --      Peak Flow --       Pain Score 12/23/17 1341 8     Pain Loc --      Pain Edu? --      Excl. in GC? --     Constitutional: Alert and oriented. No acute distress. Pleasant and interactive Eyes: Conjunctivae are normal.   Nose: No congestion/rhinnorhea. Mouth/Throat: Mucous membranes are moist.    Cardiovascular: Normal rate, regular rhythm. Grossly normal heart sounds.  Good peripheral circulation.  Tenderness palpation along the sternum bilaterally, inferiorly which mimics her pain Respiratory: Normal respiratory effort.  No retractions.  Clear to auscultation bilaterally Gastrointestinal: Soft and nontender. No distention.  Genitourinary: deferred Musculoskeletal:  Warm and well perfused Neurologic:  Normal speech and language. No gross focal neurologic deficits are appreciated.  Skin:  Skin is warm, dry and intact. No rash noted. Psychiatric: Mood and affect are normal. Speech and behavior are normal.  ____________________________________________   LABS (all labs ordered are listed, but only abnormal results are displayed)  Labs Reviewed  BASIC METABOLIC PANEL  CBC  TROPONIN I  PREGNANCY, URINE   ____________________________________________  EKG  ED ECG REPORT I, Jene Every, the attending physician, personally viewed and interpreted this ECG.  Date: 12/23/2017  Rhythm: normal sinus rhythm QRS Axis: normal Intervals: normal ST/T Wave abnormalities: normal Narrative Interpretation: no evidence of acute ischemia  ____________________________________________  RADIOLOGY  Chest x-ray normal ____________________________________________   PROCEDURES  Procedure(s) performed: No  Procedures   Critical Care performed: No ____________________________________________   INITIAL IMPRESSION / ASSESSMENT AND PLAN / ED COURSE  Pertinent labs & imaging results that were available during my care of the patient were reviewed by me and considered in my medical decision making (see  chart for details).  Patient presents with chest pain.  Differential diagnosis includes chest wall pain, pericarditis, myocarditis, bronchitis/pneumonia, very low risk for ACS  EKG is reassuring, symptoms not consistent with pericarditis and given normal troponin not consistent with myocarditis or ACS.  Strongly suspect chest wall pain, treated with IV Toradol.  Chest x-ray is clear lung exam is normal.  Will give IV fluids as well given her history of POTS  ----------------------------------------- 5:07 PM on 12/23/2017 -----------------------------------------  Patient complaining of continued chest discomfort.  On reexam worsening tenderness around the sternum.  Will give small dose of morphine and Zofran   Will ask my colleague to re-evaluate    ____________________________________________   FINAL CLINICAL IMPRESSION(S) / ED DIAGNOSES  Final diagnoses:  Chest wall pain        Note:  This document was prepared using Dragon voice recognition software and may include unintentional dictation errors.    Jene Every, MD 12/23/17 [phone]

## 2017-12-23 NOTE — Discharge Instructions (Signed)
You may take Tylenol or Motrin for your pain.  You may also use a heating pad for 15 minutes every 2 hours.  Please drink plenty of fluids stay well-hydrated.  Return to the emergency department if you develop severe pain, lightheadedness or fainting, fever, or any other symptoms concerning to you.

## 2017-12-24 ENCOUNTER — Ambulatory Visit: Payer: Self-pay | Admitting: Physician Assistant

## 2017-12-24 ENCOUNTER — Encounter: Payer: Self-pay | Admitting: Physician Assistant

## 2017-12-24 VITALS — BP 122/72 | HR 91 | Temp 98.4°F

## 2017-12-24 DIAGNOSIS — G90A Postural orthostatic tachycardia syndrome (POTS): Secondary | ICD-10-CM

## 2017-12-24 DIAGNOSIS — G8929 Other chronic pain: Secondary | ICD-10-CM

## 2017-12-24 DIAGNOSIS — R0789 Other chest pain: Secondary | ICD-10-CM

## 2017-12-24 DIAGNOSIS — R000 Tachycardia, unspecified: Principal | ICD-10-CM

## 2017-12-24 DIAGNOSIS — I951 Orthostatic hypotension: Principal | ICD-10-CM

## 2017-12-24 NOTE — Progress Notes (Signed)
   Subjective: Chest pain    Patient ID: Leah Hartman, female    DOB: 01/06/1996, 22 y.o.   MRN: [id number]  HPI Patient here as a follow-up from the emergency room for chest pain.  Patient has a history of POTS.  Patient arrived at this clinic yesterday and due to her complaints was sent to the emergency room.  Definitive workup to include CT scan, EKG, chest x-ray, and labs were unremarkable.  Patient was given Toradol at the ER with no relief.  Patient was also given a narcotic medication I have intravenously and develop a syncope episode.  Patient was rehydrated with fluids and discharge.  Patient has a scheduled appointment on February 03, 2018 cardiology.  Patient states still experiencing chest pains.   Review of Systems A headache and chest pain.    Objective:   Physical Exam Patient appears no acute distress and vital signs are stable.  HEENT is unremarkable.  Lungs are clear to auscultation heart is regular rate and rhythm.       Assessment & Plan: Chest wall pain  This clinic will call her cardiologist and try to advance her appointment for definitive evaluation and treatment.  Patient again advised to go to the ED if condition worsens.

## 2017-12-25 ENCOUNTER — Ambulatory Visit: Payer: Managed Care, Other (non HMO) | Admitting: Internal Medicine

## 2017-12-25 ENCOUNTER — Encounter: Payer: Self-pay | Admitting: Internal Medicine

## 2017-12-25 ENCOUNTER — Encounter: Payer: Self-pay | Admitting: *Deleted

## 2017-12-25 VITALS — BP 107/74 | HR 68 | Ht 69.0 in | Wt 205.5 lb

## 2017-12-25 DIAGNOSIS — G901 Familial dysautonomia [Riley-Day]: Secondary | ICD-10-CM | POA: Diagnosis not present

## 2017-12-25 NOTE — Progress Notes (Signed)
,      Patient Care Team: Bartholomew Boards as PCP - General (Physician Assistant)   HPI  Leah Hartman is a 22 y.o. 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 These occurred in the wake of a mononucleosis infection.    She is deplete of salt and water in her diet. She was unable to take salt supplementation  because of GI irritation.  She has appt with [facility]  Dr Lavona Mound 7/16>> less vomiting on the new diet  She has not been seen in about 2+ yrs.   Last week she had a syncopal episode.  She awakened and was feeling really lousy.  Her blood pressure was in the 80s.  She ended up in the emergency room where she underwent an evaluation for chest pain with negative CTA, negative troponins.  She was given IV fluids and her subsequent orthostatics were not all that abnormal and she was discharged home.  She is continued to feel lightheaded relatively low blood pressures.  She currently has a URI.  She has done better taking salt water as a solution than the salt tablets.      Past Medical History:  Diagnosis Date  . Abdominal pain   . Dysmenorrhea 05/10/2015  . Endometriosis   . GERD (gastroesophageal reflux disease)    heartburn   . Nausea & vomiting   . Ovarian cyst   . Ovarian cyst rupture   . POTS (postural orthostatic tachycardia syndrome)   . Syncope and collapse     Past Surgical History:  Procedure Laterality Date  . APPENDECTOMY    . ESOPHAGOGASTRODUODENOSCOPY N/A 04/07/2015   Procedure: ESOPHAGOGASTRODUODENOSCOPY (EGD);  Surgeon: Midge Minium, MD;  Location: [facility];  Service: Gastroenterology;  Laterality: N/A;  . INTRAUTERINE DEVICE (IUD) INSERTION N/A 05/15/2015   Procedure: INTRAUTERINE DEVICE (IUD) INSERTION;  Surgeon: Herold Harms, MD;  Location: [facility];  Service: Gynecology;  Laterality: N/A;  . LAPAROSCOPY N/A 05/15/2015   Procedure: with excision and fulgeration of endometriosis;  Surgeon: Herold Harms,  MD;  Location: [facility];  Service: Gynecology;  Laterality: N/A;  with excision and fulgeration of endometriosis  . OVARIAN CYST SURGERY Bilateral   . TONSILLECTOMY AND ADENOIDECTOMY    . TYMPANOSTOMY TUBE PLACEMENT      No current outpatient medications on file.   No current facility-administered medications for this visit.     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  I wonder also about the interval 35 pound weight gain in the last 18 months.  Lately, I think her symptoms may be related to whatever is her concurrent URI.  I have encouraged her with fluid and  salt intake.  I have also encouraged her to think about spanks when she goes to work. Reiterated the importance of aerobic exercise  We spent more than 50% of our >25 min visit in face to face counseling regarding the above

## 2017-12-25 NOTE — Patient Instructions (Signed)
Medication Instructions: - Your physician recommends that you continue on your current medications as directed. Please refer to the Current Medication list given to you today.   Labwork: - none ordered  Procedures/Testing: - none ordered  Follow-Up: - Your physician recommends that you schedule a follow-up appointment in: 3 months with Dr. Graciela HusbandsKlein   Any Additional Special Instructions Will Be Listed Below (If Applicable). 1) Nuun 2) Vytassium 3) Thermatabs  All are 2 tablets twice daily    If you need a refill on your cardiac medications before your next appointment, please call your pharmacy.

## 2018-01-01 ENCOUNTER — Ambulatory Visit: Payer: Self-pay | Admitting: Medical

## 2018-01-01 ENCOUNTER — Inpatient Hospital Stay: Admission: RE | Admit: 2018-01-01 | Payer: Self-pay | Source: Ambulatory Visit

## 2018-01-01 VITALS — BP 119/80 | HR 101 | Temp 98.4°F | Resp 16

## 2018-01-01 DIAGNOSIS — G90A Postural orthostatic tachycardia syndrome (POTS): Secondary | ICD-10-CM

## 2018-01-01 DIAGNOSIS — R197 Diarrhea, unspecified: Secondary | ICD-10-CM

## 2018-01-01 DIAGNOSIS — E860 Dehydration: Secondary | ICD-10-CM

## 2018-01-01 DIAGNOSIS — I951 Orthostatic hypotension: Principal | ICD-10-CM

## 2018-01-01 DIAGNOSIS — R112 Nausea with vomiting, unspecified: Secondary | ICD-10-CM

## 2018-01-01 DIAGNOSIS — R000 Tachycardia, unspecified: Principal | ICD-10-CM

## 2018-01-01 MED ORDER — ONDANSETRON 4 MG PO TBDP: 4.0000 mg | ORAL_TABLET | Freq: Three times a day (TID) | ORAL | 0 refills | Status: DC | PRN

## 2018-01-01 NOTE — Progress Notes (Signed)
   Subjective:    Patient ID: Leah Hartman, female    DOB: 1996-01-12, 22 y.o.   MRN: [id number]  HPI Female 22 yo in non acute distress.Started on Wednesday at  3am says she vomited  20 times and had diarrhea 20 times. Today she is nauseous with vomting 4 times and diarrhea 4 times. She has been unable to eat since Wednesday. Works in [place] in [place] and they have had  10 cases of Norovirus.  Mother and Father are sick. Father Paramedic sent home from work .  Mother given 1L of fluid by Father.  She has a history of POTS. Her  Brother brought her today because of her being dizzy and lightheaded.  Review of Systems  Constitutional: Negative for chills and fever.  Gastrointestinal: Positive for abdominal pain (epigastric patient thinks it is due to her vomiting so much), nausea and vomiting. Negative for diarrhea.  Musculoskeletal: Positive for myalgias (muscle cramps).  Neurological: Positive for dizziness and light-headedness. Negative for syncope.  muscle cramping last night.     Objective:   Physical Exam  Constitutional: She is oriented to person, place, and time. She appears well-developed and well-nourished.  HENT:  Head: Normocephalic and atraumatic.  Right Ear: Hearing, tympanic membrane, external ear and ear canal normal.  Left Ear: Hearing, tympanic membrane, external ear and ear canal normal.  Mouth/Throat: Uvula is midline, oropharynx is clear and moist and mucous membranes are normal.  Eyes: Conjunctivae and EOM are normal. Pupils are equal, round, and reactive to light.  Neck: Normal range of motion. Neck supple.  Cardiovascular: Normal rate, regular rhythm and normal heart sounds.  Pulmonary/Chest: Effort normal and breath sounds normal.  Lymphadenopathy:    She has no cervical adenopathy.  Neurological: She is alert and oriented to person, place, and time.  Skin: Skin is warm and dry.  Psychiatric: She has a normal mood and affect. Her behavior is normal.  Judgment and thought content normal.  Nursing note and vitals reviewed.    Looks like she does not feel well Orthostatics  110/80  HR 105 standing 110/70 HR 98 sitting 119/79 HR 88 lying    Assessment & Plan:  POTS, dehydration mild,  Will do  1 L / hr of NS  At  outpatient services , spoke to [place] and [place]. CBC and MET C pending . Given work note for today and tomorrow.  Scheduled for rehcecked at  10:20 am tomorrow. CLD x 24 hours , Gatorade. Then titrate up to a bland diet. Patient verbalizes understanding and has no questions at discharge.

## 2018-01-02 ENCOUNTER — Ambulatory Visit: Payer: Self-pay | Admitting: Physician Assistant

## 2018-01-02 ENCOUNTER — Encounter: Payer: Self-pay | Admitting: Physician Assistant

## 2018-01-02 DIAGNOSIS — B349 Viral infection, unspecified: Secondary | ICD-10-CM | POA: Insufficient documentation

## 2018-01-02 NOTE — Progress Notes (Signed)
   Subjective: Recheck secondary to viral illness    Patient ID: Leah Shadowmily Mei Hartman, female    DOB: 11/28/1995, 22 y.o.   MRN: [id number]  HPI Patient was seen yesterday and was found to be dehydrated secondary to viral illness.  Patient was rehydrated with 2 L of normal saline.  Patient states much improvement today with no nausea,vomiting, or diarrhea.  Patient is scheduled return to work on 12/05/2017 and the clearance before reporting to a job.   Review of Systems Negative    Objective:   Physical Exam HEENT is unremarkable.  Neck is supple without adenopathy.  Lungs clear to auscultation heart regular rate and rhythm.  Abdomen is negative HSM.  Normoactive bowel sounds is soft nontender to palpation.       Assessment & Plan: Resolving viral illness  Patient given clearance to return to work on 12/05/2017.

## 2018-01-08 NOTE — Addendum Note (Signed)
Addended by: Catha BrowEACON, MONIQUE T on: 01/08/2018 10:23 AM   Modules accepted: Orders

## 2018-01-14 ENCOUNTER — Telehealth: Payer: Self-pay | Admitting: Internal Medicine

## 2018-01-14 NOTE — Telephone Encounter (Signed)
Patient calling, states that she needs a letter for work.

## 2018-01-14 NOTE — Telephone Encounter (Signed)
LM for pt to return my call

## 2018-01-26 NOTE — Telephone Encounter (Signed)
S/w patient.  She needs a letter to give to her employer stating she may need to take a short break during her work shift if needed. Currently, patient finds she must take a break to "cool down" 2-3 times per shift.  Her employer told her she would need a letter from her doctor stating this is necessary if she wants to continue to do this.  Advised I will route to Dr Graciela HusbandsKlein and his nurse, Herbert SetaHeather, for review.

## 2018-01-26 NOTE — Telephone Encounter (Signed)
New message:   Pt calling stating she needs a doctors note for work. Pt did not go into detail appt was made for follow up on 5/14.

## 2018-01-27 NOTE — Telephone Encounter (Signed)
This is what you said in a letter for her back in 2016 for work-  "Leah Hartman (11/10/1996) should be restricted to part time hours. Prolonged standing and ambiant heat can also be problematic for her."  Her current message doesn't say anything about her hours, but just her need to take breaks.   Can you dictate a letter for her please!

## 2018-01-28 ENCOUNTER — Encounter: Payer: Self-pay | Admitting: *Deleted

## 2018-01-28 ENCOUNTER — Encounter: Payer: Self-pay | Admitting: Internal Medicine

## 2018-01-28 NOTE — Telephone Encounter (Signed)
Funny! I had just done one for her when you sent this back to me- I talked to her this morning to clarify with her how many and for how long and added this to your letter!   The patient will pick up her letter this afternoon at the front desk.

## 2018-01-28 NOTE — Telephone Encounter (Signed)
Done  See if you can find it  :))

## 2018-02-03 ENCOUNTER — Ambulatory Visit: Payer: Self-pay | Admitting: Internal Medicine

## 2018-02-04 ENCOUNTER — Ambulatory Visit
Admission: RE | Admit: 2018-02-04 | Discharge: 2018-02-04 | Disposition: A | Discharge status: Home / Self Care | Payer: Managed Care, Other (non HMO) | Source: Ambulatory Visit | Attending: Obstetrics and Gynecology | Admitting: Obstetrics and Gynecology

## 2018-02-04 ENCOUNTER — Other Ambulatory Visit: Payer: Self-pay | Admitting: Obstetrics and Gynecology

## 2018-02-04 DIAGNOSIS — N83201 Unspecified ovarian cyst, right side: Secondary | ICD-10-CM | POA: Diagnosis not present

## 2018-02-04 DIAGNOSIS — R102 Pelvic and perineal pain: Secondary | ICD-10-CM | POA: Diagnosis present

## 2018-02-04 DIAGNOSIS — N83202 Unspecified ovarian cyst, left side: Secondary | ICD-10-CM | POA: Insufficient documentation

## 2018-02-16 ENCOUNTER — Telehealth: Payer: Self-pay | Admitting: Internal Medicine

## 2018-02-16 NOTE — Telephone Encounter (Signed)
Spoke with pt regarding her needs with a new letter stating she will need breaks for the duration of her employment with her current employer. She stated she will come by the [place] office to get a copy. I called the [place] office and they will print a copy and have it waiting for Leah Hartman when she arrives.

## 2018-02-16 NOTE — Telephone Encounter (Signed)
New Message   Patient is requesting a note that indicates indefinitely needs to cool down 2-3 times a day Please call to discuss. .Marland Kitchen

## 2018-02-17 ENCOUNTER — Encounter: Payer: Self-pay | Admitting: *Deleted

## 2018-03-19 ENCOUNTER — Ambulatory Visit: Payer: Managed Care, Other (non HMO) | Admitting: Internal Medicine

## 2018-03-19 ENCOUNTER — Encounter: Payer: Self-pay | Admitting: Internal Medicine

## 2018-03-19 ENCOUNTER — Other Ambulatory Visit: Payer: Self-pay | Admitting: Internal Medicine

## 2018-03-19 VITALS — BP 120/68 | HR 86 | Temp 98.8°F | Ht 69.0 in | Wt 211.8 lb

## 2018-03-19 DIAGNOSIS — R000 Tachycardia, unspecified: Secondary | ICD-10-CM

## 2018-03-19 DIAGNOSIS — R480 Dyslexia and alexia: Secondary | ICD-10-CM

## 2018-03-19 DIAGNOSIS — Z0000 Encounter for general adult medical examination without abnormal findings: Secondary | ICD-10-CM | POA: Diagnosis not present

## 2018-03-19 DIAGNOSIS — Z13220 Encounter for screening for lipoid disorders: Secondary | ICD-10-CM

## 2018-03-19 DIAGNOSIS — F909 Attention-deficit hyperactivity disorder, unspecified type: Secondary | ICD-10-CM | POA: Diagnosis not present

## 2018-03-19 DIAGNOSIS — Z0184 Encounter for antibody response examination: Secondary | ICD-10-CM | POA: Diagnosis not present

## 2018-03-19 DIAGNOSIS — I951 Orthostatic hypotension: Secondary | ICD-10-CM

## 2018-03-19 DIAGNOSIS — E559 Vitamin D deficiency, unspecified: Secondary | ICD-10-CM | POA: Diagnosis not present

## 2018-03-19 DIAGNOSIS — R4586 Emotional lability: Secondary | ICD-10-CM | POA: Diagnosis not present

## 2018-03-19 DIAGNOSIS — G90A Postural orthostatic tachycardia syndrome (POTS): Secondary | ICD-10-CM

## 2018-03-19 DIAGNOSIS — Z1159 Encounter for screening for other viral diseases: Secondary | ICD-10-CM

## 2018-03-19 LAB — COMPREHENSIVE METABOLIC PANEL
ALT: 10 U/L (ref 0–35)
AST: 16 U/L (ref 0–37)
Albumin: 4.3 g/dL (ref 3.5–5.2)
Alkaline Phosphatase: 61 U/L (ref 39–117)
BUN: 12 mg/dL (ref 6–23)
CALCIUM: 9.8 mg/dL (ref 8.4–10.5)
CHLORIDE: 103 meq/L (ref 96–112)
CO2: 29 meq/L (ref 19–32)
CREATININE: 0.74 mg/dL (ref 0.40–1.20)
GFR: 104.18 mL/min (ref >60.00)
Glucose, Bld: 96 mg/dL (ref 70–99)
Potassium: 4.6 mEq/L (ref 3.5–5.1)
Sodium: 137 mEq/L (ref 135–145)
Total Bilirubin: 0.4 mg/dL (ref 0.2–1.2)
Total Protein: 7.4 g/dL (ref 6.0–8.3)

## 2018-03-19 LAB — LIPID PANEL
CHOL/HDL RATIO: 3
Cholesterol: 151 mg/dL (ref 0–200)
HDL: 45.3 mg/dL (ref >39.00)
LDL CALC: 89 mg/dL (ref 0–99)
NonHDL: 105.78
TRIGLYCERIDES: 84 mg/dL (ref 0.0–149.0)
VLDL: 16.8 mg/dL (ref 0.0–40.0)

## 2018-03-19 LAB — VITAMIN D 25 HYDROXY (VIT D DEFICIENCY, FRACTURES): VITD: 16.83 ng/mL — ABNORMAL LOW (ref 30.00–100.00)

## 2018-03-19 MED ORDER — CHOLECALCIFEROL 1.25 MG (50000 UT) PO CAPS: 50000.0000 [IU] | ORAL_CAPSULE | ORAL | 1 refills | Status: DC

## 2018-03-19 NOTE — Patient Instructions (Addendum)
F/u in 3 months   Attention Deficit Hyperactivity Disorder Attention deficit hyperactivity disorder (ADHD) is a condition that can make it hard for a child to pay attention and concentrate or to control his or her behavior. The child may also have a lot of energy. ADHD is a disorder of the brain (neurodevelopmental disorder), and symptoms are typically first seen in early childhood. It is a common reason for behavioral and academic problems in school. There are three main types of ADHD:  Inattentive. With this type, children have difficulty paying attention.  Hyperactive-impulsive. With this type, children have a lot of energy and have difficulty controlling their behavior.  Combination. This type involves having symptoms of both of the other types.  ADHD is a lifelong condition. If it is not treated, the disorder can affect a child's future academic achievement, employment, and relationships. What are the causes? The exact cause of this condition is not known. What increases the risk? This condition is more likely to develop in:  Children who have a first-degree relative, such as a parent or brother or sister, with the condition.  Children who had a low birth weight.  Children whose mothers had problems during pregnancy or used alcohol or tobacco during pregnancy.  Children who have had a brain infection or a head injury.  Children who have been exposed to lead.  What are the signs or symptoms? Symptoms of this condition depend on the type of ADHD. Symptoms are listed here for each type: Inattentive  Problems with organization.  Difficulty staying focused.  Problems completing assignments at school.  Often making simple mistakes.  Problems sustaining mental effort.  Not listening to instructions.  Losing things often.  Forgetting things often.  Being easily distracted. Hyperactive-impulsive  Fidgeting often.  Difficulty sitting still in one's seat.  Talking a  lot.  Talking out of turn.  Interrupting others.  Difficulty relaxing or doing quiet activities.  High energy levels and constant movement.  Difficulty waiting.  Always "on the go." Combination  Having symptoms of both of the other types. Children with ADHD may feel frustrated with themselves and may find school to be particularly discouraging. They often perform below their abilities in school. As children get older, the excess movement can lessen, but the problems with paying attention and staying organized often continue. Most children do not outgrow ADHD, but with good treatment, they can learn to cope with the symptoms. How is this diagnosed? This condition is diagnosed based on a child's symptoms and academic history. The child's health care provider will do a complete assessment. As part of the assessment, the health care provider will ask the child questions and will ask the parents and teachers for their observations of the child. The health care provider looks for specific symptoms of ADHD. Diagnosis will include:  Ruling out other reasons for the child's behavior.  Reviewing behavior rating scales that have been filled out about the child by people who deal with the child on a daily basis.  A diagnosis is made only after all information from multiple people has been considered. How is this treated? Treatment for this condition may include:  Behavior therapy.  Medicines to decrease impulsivity and hyperactivity and to increase attention. Behavior therapy is preferred for children younger than 57 years old. The combination of medicine and behavior therapy is most effective for children older than 72 years of age.  Tutoring or extra support at school.  Techniques for parents to use at home to  help manage their child's symptoms and behavior.  Follow these instructions at home: Eating and drinking  Offer your child a well-balanced diet. Breakfast that includes a balance  of whole grains, protein, and fruits or vegetables is especially important for school performance.  If your child has trouble with hyperactivity, have your child avoid drinks that contain caffeine. These include: ? Soft drinks. ? Coffee. ? Tea.  If your child is older and finds that caffeinated drinks help to improve his or her attention, talk with your child's health care provider about what amount of caffeine intake is a safe for your child. Lifestyle   Make sure your child gets a full night of sleep and regular daily exercise.  Help manage your child's behavior by following the techniques learned in therapy. These may include: ? Looking for good behavior and rewarding it. ? Making rules for behavior that your child can understand and follow. ? Giving clear instructions. ? Responding consistently to your child's challenging behaviors. ? Setting realistic goals. ? Looking for activities that can lead to success and self-esteem. ? Making time for pleasant activities with your child. ? Giving lots of affection.  Help your child learn to be organized. Some ways to do this include: ? Keeping daily schedules the same. Have a regular wake-up time and bedtime for your child. Schedule all activities, including time for homework and time for play. Post the schedule in a place where your child will see it. Mark schedule changes in advance. ? Having a regular place for your child to store items such as clothing, backpacks, and school supplies. ? Encouraging your child to write down school assignments and to bring home needed books. Work with your child's teachers for assistance in organizing school work. General instructions  Learn as much as you can about ADHD. This will improve your ability to help your child and to make sure he or she gets the support needed. It will also help you educate your child's teachers and instructors if they do not feel that they have adequate knowledge or experience  in these areas.  Work with your child's teachers to make sure your child gets the support and extra help that is needed. This may include: ? Tutoring. ? Teacher cues to help your child remain on task. ? Seating changes so your child is working at a desk that is free from distractions.  Give over-the-counter and prescription medicines only as told by your child's health care provider.  Keep all follow-up visits as told by your health care provider. This is important. Contact a health care provider if:  Your child has repeated muscle twitches (tics), coughs, or speech outbursts.  Your child has sleep problems.  Your child has a marked loss of appetite.  Your child develops depression.  Your child has new or worsening behavioral problems.  Your child has dizziness.  Your child has a racing heart.  Your child has stomach pains.  Your child develops headaches. Get help right away if:  Your child talks about or threatens suicide.  You are worried that your child is having a bad reaction to a medicine that he or she is taking for ADHD. This information is not intended to replace advice given to you by your health care provider. Make sure you discuss any questions you have with your health care provider. Document Released: 11/01/2002 Document Revised: 07/10/2016 Document Reviewed: 06/06/2016 Elsevier Interactive Patient Education  [employer]2018 Elsevier Inc.

## 2018-03-19 NOTE — Progress Notes (Addendum)
Chief Complaint  Patient presents with  . New Patient (Initial Visit)   New patient  1. C/o mood swings, h/o dyslexia and adhd she was on meds in the past last psych testing age 22 y.o and 22 y.o. She reports lack of focus and also mood swings affecting relationship with boyfriend though she is not depressed  2. H/o endometriosis on elavil 10 mg qd did not take femara per [facility] Dr. Leafy Ro c/w fact cancer medication    Review of Systems  Constitutional: Negative for weight loss.  HENT: Negative for hearing loss.   Eyes: Negative for blurred vision.  Respiratory: Negative for shortness of breath.   Cardiovascular: Negative for chest pain.  Gastrointestinal: Negative for abdominal pain.  Musculoskeletal: Negative for falls.  Skin: Negative for rash.  Neurological: Negative for headaches.  Psychiatric/Behavioral: Negative for depression.       +ADHD +dyslexia  +mood swings     Past Medical History:  Diagnosis Date  . Abdominal pain   . Dysmenorrhea 05/10/2015  . Endometriosis   . Gastritis    egd 04/07/15   . Nausea & vomiting   . Ovarian cyst   . Ovarian cyst rupture   . POTS (postural orthostatic tachycardia syndrome)   . Syncope and collapse    Past Surgical History:  Procedure Laterality Date  . APPENDECTOMY    . ESOPHAGOGASTRODUODENOSCOPY N/A 04/07/2015   Procedure: ESOPHAGOGASTRODUODENOSCOPY (EGD);  Surgeon: Lucilla Lame, MD;  Location: [facility];  Service: Gastroenterology;  Laterality: N/A;  . INTRAUTERINE DEVICE (IUD) INSERTION N/A 05/15/2015   Procedure: INTRAUTERINE DEVICE (IUD) INSERTION;  Surgeon: Brayton Mars, MD;  Location: [facility];  Service: Gynecology;  Laterality: N/A;  . LAPAROSCOPY N/A 05/15/2015   Procedure: with excision and fulgeration of endometriosis;  Surgeon: Brayton Mars, MD;  Location: [facility];  Service: Gynecology;  Laterality: N/A;  with excision and fulgeration of endometriosis  . OVARIAN CYST SURGERY Bilateral   .  TONSILLECTOMY AND ADENOIDECTOMY    . TYMPANOSTOMY TUBE PLACEMENT     Family History  Problem Relation Age of Onset  . Hypertension Mother   . Diabetes Father   . Colon cancer Neg Hx   . Ovarian cancer Neg Hx   . Breast cancer Neg Hx    Social History   Socioeconomic History  . Marital status: Single    Spouse name: Not on file  . Number of children: Not on file  . Years of education: Not on file  . Highest education level: Not on file  Occupational History  . Occupation: [employer]  Social Needs  . Financial resource strain: Not on file  . Food insecurity:    Worry: Not on file    Inability: Not on file  . Transportation needs:    Medical: Not on file    Non-medical: Not on file  Tobacco Use  . Smoking status: Never Smoker  . Smokeless tobacco: Never Used  Substance and Sexual Activity  . Alcohol use: No    Alcohol/week: 0.0 oz  . Drug use: No  . Sexual activity: Yes    Birth control/protection: IUD  Lifestyle  . Physical activity:    Days per week: Not on file    Minutes per session: Not on file  . Stress: Not on file  Relationships  . Social connections:    Talks on phone: Not on file    Gets together: Not on file    Attends religious service: Not on  file    Active member of club or organization: Not on file    Attends meetings of clubs or organizations: Not on file    Relationship status: Not on file  . Intimate partner violence:    Fear of current or ex partner: Not on file    Emotionally abused: Not on file    Physically abused: Not on file    Forced sexual activity: Not on file  Other Topics Concern  . Not on file  Social History Narrative   Works Administrator, sports    Single    Current Meds  Medication Sig  . amitriptyline (ELAVIL) 10 MG tablet Take 10 mg by mouth at bedtime.  . Levonorgestrel (LILETTA, 52 MG,) 19.5 MCG/DAY IUD IUD by Intrauterine route.  . [DISCONTINUED] letrozole (FEMARA) 2.5 MG tablet Take by mouth.    No Known Allergies Recent Results (from the past 2160 hour(s))  Basic metabolic panel     Status: None   Collection Time: 12/23/17  1:42 PM  Result Value Ref Range   Sodium 138 135 - 145 mmol/L   Potassium 3.9 3.5 - 5.1 mmol/L   Chloride 106 101 - 111 mmol/L   CO2 25 22 - 32 mmol/L   Glucose, Bld 99 65 - 99 mg/dL   BUN 15 6 - 20 mg/dL   Creatinine, Ser 0.78 0.44 - 1.00 mg/dL   Calcium 9.3 8.9 - 10.3 mg/dL   GFR calc non Af Amer >60 >60 mL/min   GFR calc Af Amer >60 >60 mL/min    Comment: (NOTE) The eGFR has been calculated using the CKD EPI equation. This calculation has not been validated in all clinical situations. eGFR's persistently <60 mL/min signify possible Chronic Kidney Disease.    Anion gap 7 5 - 15    Comment: Performed at [facility], [address] [place], [place] [address]  CBC     Status: None   Collection Time: 12/23/17  1:42 PM  Result Value Ref Range   WBC 8.1 3.6 - 11.0 K/uL   RBC 4.68 3.80 - 5.20 MIL/uL   Hemoglobin 13.7 12.0 - 16.0 g/dL   HCT 41.3 35.0 - 47.0 %   MCV 88.2 80.0 - 100.0 fL   MCH 29.4 26.0 - 34.0 pg   MCHC 33.3 32.0 - 36.0 g/dL   RDW 14.0 11.5 - 14.5 %   Platelets 226 150 - 440 K/uL    Comment: Performed at [facility], [address] [place], [place] [address]  Troponin I     Status: None   Collection Time: 12/23/17  1:42 PM  Result Value Ref Range   Troponin I <0.03 <0.03 ng/mL    Comment: Performed at [facility], [address] [place], [place] [address]  Pregnancy, urine     Status: None   Collection Time: 12/23/17  3:15 PM  Result Value Ref Range   Preg Test, Ur NEGATIVE NEGATIVE    Comment: Performed at [facility], [address] [place], [place] [address]  Fibrin derivatives [facility] only)     Status: None   Collection Time: 12/23/17  7:09 PM  Result Value Ref Range   Fibrin derivatives D-dimer ([facility]) 303.17 0.00 - 499.00 ng/mL (FEU)    Comment: (NOTE) <> Exclusion  of Venous Thromboembolism (VTE) - OUTPATIENT ONLY   (Emergency Department or [place])   0-499 ng/ml (FEU): With a low to intermediate pretest probability  for VTE this test result excludes the diagnosis                      of VTE.   >499 ng/ml (FEU) : VTE not excluded; additional work up for VTE is                      required. <> Testing on Inpatients and Evaluation of Disseminated Intravascular   Coagulation (DIC) Reference Range:   0-499 ng/ml (FEU) Performed at [facility], [address] [place], [place] [address]    Objective  Body mass index is 31.28 kg/m. Wt Readings from Last 3 Encounters:  03/19/18 211 lb 12.8 oz (96.1 kg)  12/25/17 205 lb 8 oz (93.2 kg)  12/23/17 200 lb (90.7 kg)   Temp Readings from Last 3 Encounters:  03/19/18 98.8 F (37.1 C) (Oral)  01/02/18 98.4 F (36.9 C) (Oral)  01/01/18 98.4 F (36.9 C) (Oral)   BP Readings from Last 3 Encounters:  03/19/18 120/68  01/02/18 100/60  01/01/18 119/80   Pulse Readings from Last 3 Encounters:  03/19/18 86  01/02/18 (!) 111  01/01/18 (!) 101    Physical Exam  Constitutional: She is oriented to person, place, and time. Vital signs are normal. She appears well-developed and well-nourished. She is cooperative.  HENT:  Head: Normocephalic and atraumatic.  Mouth/Throat: Oropharynx is clear and moist and mucous membranes are normal.  Eyes: Pupils are equal, round, and reactive to light. Conjunctivae are normal.  Cardiovascular: Normal rate, regular rhythm and normal heart sounds.  Pulmonary/Chest: Effort normal and breath sounds normal.  Neurological: She is alert and oriented to person, place, and time. Coordination normal.  Skin: Skin is warm, dry and intact.  Benign nevi trunk  Psychiatric: She has a normal mood and affect. Her speech is normal and behavior is normal. Judgment and thought content normal. Cognition and memory are normal.  Nursing note and vitals  reviewed.   Assessment   1. H/o ADHD dx 22 y.o, dyslexia, mood swings  2. Endometriosis, dysmenorrhea  3. HM Plan  1.  Refer for psychological testing [facility] assess dyslexia, mood swings retest ADHD determination for meds  Consider psychiatry referral in future pt already following employee therapy but ineffective for mood swings  Consider SLP in future for dyslexia   2. On elavil for endometriosis did not take femara  F/u [facility] Dr. Leafy Ro  3.  Did not get flu shot this year  Tdap had  GC/C neg 08/21/17, HIV neg 05/10/15  Pap get records Dr. Leafy Ro  Check records HPV, Hep B    Provider: Dr. Olivia Mackie McLean-Scocuzza-Internal Medicine

## 2018-03-19 NOTE — Progress Notes (Signed)
Pre visit review using our clinic review tool, if applicable. No additional management support is needed unless otherwise documented below in the visit note. 

## 2018-04-06 ENCOUNTER — Telehealth: Payer: Self-pay

## 2018-04-06 NOTE — Telephone Encounter (Signed)
Copied from CRM [id number]. Topic: General - Other >> Apr 06, 2018  9:11 AM Leafy Ro wrote: Reason for CRM: pt has been tested for dyslexia  and would like the nurse to return her call. Pt is in route home to get the date of test.

## 2018-04-07 ENCOUNTER — Encounter: Payer: Self-pay | Admitting: Internal Medicine

## 2018-04-07 ENCOUNTER — Ambulatory Visit: Payer: Managed Care, Other (non HMO) | Admitting: Internal Medicine

## 2018-04-07 VITALS — BP 111/73 | HR 88 | Ht 69.0 in | Wt 214.5 lb

## 2018-04-07 DIAGNOSIS — G901 Familial dysautonomia [Riley-Day]: Secondary | ICD-10-CM | POA: Diagnosis not present

## 2018-04-07 NOTE — Progress Notes (Signed)
,      Patient Care Team: McLean-Scocuzza, Pasty Spillers, MD as PCP - General (Internal Medicine)   HPI  Leah Hartman is a 22 y.o. 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 These occurred in the wake of a mononucleosis infection.    She is deplete of salt and water in her diet. She was unable to take salt supplementation  because of GI irritation.  She has appt with [facility]  Dr Lavona Mound 7/16>> less vomiting on the new diet  She has done better taking salt water as a solution than the salt tablets.  She struggles some.  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  She has had some lightheadedness.  The increasing ambient temperature has been a challenge.  There is been fatigue.  She is not exercising.     Past Medical History:  Diagnosis Date  . Abdominal pain   . Dysmenorrhea 05/10/2015  . Endometriosis   . Gastritis    egd 04/07/15   . Nausea & vomiting   . Ovarian cyst   . Ovarian cyst rupture   . POTS (postural orthostatic tachycardia syndrome)   . Syncope and collapse     Past Surgical History:  Procedure Laterality Date  . APPENDECTOMY    . ESOPHAGOGASTRODUODENOSCOPY N/A 04/07/2015   Procedure: ESOPHAGOGASTRODUODENOSCOPY (EGD);  Surgeon: Midge Minium, MD;  Location: [facility];  Service: Gastroenterology;  Laterality: N/A;  . INTRAUTERINE DEVICE (IUD) INSERTION N/A 05/15/2015   Procedure: INTRAUTERINE DEVICE (IUD) INSERTION;  Surgeon: Herold Harms, MD;  Location: [facility];  Service: Gynecology;  Laterality: N/A;  . LAPAROSCOPY N/A 05/15/2015   Procedure: with excision and fulgeration of endometriosis;  Surgeon: Herold Harms, MD;  Location: [facility];  Service: Gynecology;  Laterality: N/A;  with excision and fulgeration of endometriosis  . OVARIAN CYST SURGERY Bilateral   . TONSILLECTOMY AND ADENOIDECTOMY    . TYMPANOSTOMY TUBE PLACEMENT      Current Outpatient Medications  Medication Sig Dispense Refill  . amitriptyline (ELAVIL) 10 MG  tablet Take 10 mg by mouth at bedtime.    . Cholecalciferol 50000 units capsule Take 1 capsule (50,000 Units total) by mouth once a week. X 6 months 13 capsule 1  . Levonorgestrel (LILETTA, 52 MG,) 19.5 MCG/DAY IUD IUD by Intrauterine route.     No current facility-administered medications for this visit.     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  We discussed extensively the issues of dysautonomia, the physiology of orthstasis and positional stress.  We discussed the role of salt and water repletion, the importance of exercise, often needing to be started in the recumbent position, and the awareness of triggers and the role of ambient heat and  dehydration--and have encouraged her to pursue aerobic exercise.  We also discussed her weight which she has tried to address by eating much better.  Emphasized the contributing role of aerobic exercise.  Mood is more agitated than depressed at this point.  She denies suicidal ideation  We spent more than 50% of our >25 min visit in face to face counseling regarding the above

## 2018-04-07 NOTE — Patient Instructions (Signed)

## 2018-06-18 ENCOUNTER — Ambulatory Visit: Payer: Managed Care, Other (non HMO) | Admitting: Internal Medicine

## 2018-06-18 ENCOUNTER — Encounter: Payer: Self-pay | Admitting: Internal Medicine

## 2018-06-18 VITALS — BP 112/56 | HR 87 | Temp 98.3°F | Ht 69.0 in | Wt 217.4 lb

## 2018-06-18 DIAGNOSIS — R000 Tachycardia, unspecified: Secondary | ICD-10-CM | POA: Diagnosis not present

## 2018-06-18 DIAGNOSIS — N809 Endometriosis, unspecified: Secondary | ICD-10-CM

## 2018-06-18 DIAGNOSIS — I951 Orthostatic hypotension: Secondary | ICD-10-CM

## 2018-06-18 DIAGNOSIS — N3010 Interstitial cystitis (chronic) without hematuria: Secondary | ICD-10-CM | POA: Diagnosis not present

## 2018-06-18 DIAGNOSIS — F909 Attention-deficit hyperactivity disorder, unspecified type: Secondary | ICD-10-CM | POA: Diagnosis not present

## 2018-06-18 DIAGNOSIS — G90A Postural orthostatic tachycardia syndrome (POTS): Secondary | ICD-10-CM

## 2018-06-18 NOTE — Progress Notes (Signed)
Pre visit review using our clinic review tool, if applicable. No additional management support is needed unless otherwise documented below in the visit note. 

## 2018-06-18 NOTE — Patient Instructions (Signed)
Let me look into POT specialist with [facility] consider referral  Bring old paperwork in psychological testing  Please take D3 weekly x 6 months then 5000 IU daily after 6 months and this is over the counter  F/u 4-6 months     Vitamin D Deficiency Vitamin D deficiency is when your body does not have enough vitamin D. Vitamin D is important to your body for many reasons:  It helps the body to absorb two important minerals, called calcium and phosphorus.  It plays a role in bone health.  It may help to prevent some diseases, such as diabetes and multiple sclerosis.  It plays a role in muscle function, including heart function.  You can get vitamin D by:  Eating foods that naturally contain vitamin D.  Eating or drinking milk or other dairy products that have vitamin D added to them.  Taking a vitamin D supplement or a multivitamin supplement that contains vitamin D.  Being in the sun. Your body naturally makes vitamin D when your skin is exposed to sunlight. Your body changes the sunlight into a form of the vitamin that the body can use.  If vitamin D deficiency is severe, it can cause a condition in which your bones become soft. In adults, this condition is called osteomalacia. In children, this condition is called rickets. What are the causes? Vitamin D deficiency may be caused by:  Not eating enough foods that contain vitamin D.  Not getting enough sun exposure.  Having certain digestive system diseases that make it difficult for your body to absorb vitamin D. These diseases include Crohn disease, chronic pancreatitis, and cystic fibrosis.  Having a surgery in which a part of the stomach or a part of the small intestine is removed.  Being obese.  Having chronic kidney disease or liver disease.  What increases the risk? This condition is more likely to develop in:  Older people.  People who do not spend much time outdoors.  People who live in a long-term care  facility.  People who have had broken bones.  People with weak or thin bones (osteoporosis).  People who have a disease or condition that changes how the body absorbs vitamin D.  People who have dark skin.  People who take certain medicines, such as steroid medicines or certain seizure medicines.  People who are overweight or obese.  What are the signs or symptoms? In mild cases of vitamin D deficiency, there may not be any symptoms. If the condition is severe, symptoms may include:  Bone pain.  Muscle pain.  Falling often.  Broken bones caused by a minor injury.  How is this diagnosed? This condition is usually diagnosed with a blood test. How is this treated? Treatment for this condition may depend on what caused the condition. Treatment options include:  Taking vitamin D supplements.  Taking a calcium supplement. Your health care provider will suggest what dose is best for you.  Follow these instructions at home:  Take medicines and supplements only as told by your health care provider.  Eat foods that contain vitamin D. Choices include: ? Fortified dairy products, cereals, or juices. Fortified means that vitamin D has been added to the food. Check the label on the package to be sure. ? Fatty fish, such as salmon or trout. ? Eggs. ? Oysters.  Do not use a tanning bed.  Maintain a healthy weight. Lose weight, if needed.  Keep all follow-up visits as told by your health care  provider. This is important. Contact a health care provider if:  Your symptoms do not go away.  You feel like throwing up (nausea) or you throw up (vomit).  You have fewer bowel movements than usual or it is difficult for you to have a bowel movement (constipation). This information is not intended to replace advice given to you by your health care provider. Make sure you discuss any questions you have with your health care provider. Document Released: 02/03/2012 Document Revised:  04/24/2016 Document Reviewed: 03/29/2015 Elsevier Interactive Patient Education  2018 [employer]

## 2018-06-18 NOTE — Progress Notes (Signed)
Chief Complaint  Patient presents with  . Follow-up   F/u  1. POTS 2 weeks ago walking and was too hot had presyncopal episode 2/2 POTS due to see EP Dr. Graciela Husbands 07/2018 2. Dyslexia/ADHD insurance will not cover and its $1800 3. Endometriosis/interstital cystitis on Elavil 10-20 mg and doing well off Letrozole 2.5 mg qd    Review of Systems  Constitutional: Negative for weight loss.  HENT: Negative for hearing loss.   Eyes: Negative for blurred vision.  Respiratory: Negative for shortness of breath.   Cardiovascular: Negative for chest pain.  Skin: Negative for rash.  Neurological: Negative for loss of consciousness.  Psychiatric/Behavioral: Negative for depression.   Past Medical History:  Diagnosis Date  . Abdominal pain   . Dysmenorrhea 05/10/2015  . Endometriosis   . Gastritis    egd 04/07/15   . Nausea & vomiting   . Ovarian cyst   . Ovarian cyst rupture   . POTS (postural orthostatic tachycardia syndrome)   . Syncope and collapse    Past Surgical History:  Procedure Laterality Date  . APPENDECTOMY    . ESOPHAGOGASTRODUODENOSCOPY N/A 04/07/2015   Procedure: ESOPHAGOGASTRODUODENOSCOPY (EGD);  Surgeon: Midge Minium, MD;  Location: [facility];  Service: Gastroenterology;  Laterality: N/A;  . INTRAUTERINE DEVICE (IUD) INSERTION N/A 05/15/2015   Procedure: INTRAUTERINE DEVICE (IUD) INSERTION;  Surgeon: Herold Harms, MD;  Location: [facility];  Service: Gynecology;  Laterality: N/A;  . LAPAROSCOPY N/A 05/15/2015   Procedure: with excision and fulgeration of endometriosis;  Surgeon: Herold Harms, MD;  Location: [facility];  Service: Gynecology;  Laterality: N/A;  with excision and fulgeration of endometriosis  . OVARIAN CYST SURGERY Bilateral   . TONSILLECTOMY AND ADENOIDECTOMY    . TYMPANOSTOMY TUBE PLACEMENT     Family History  Problem Relation Age of Onset  . Hypertension Mother   . Diabetes Father   . Learning disabilities Father   . Birth defects  Maternal Grandmother   . Birth defects Maternal Grandfather   . Birth defects Paternal Grandmother   . Birth defects Paternal Grandfather   . Colon cancer Neg Hx   . Ovarian cancer Neg Hx   . Breast cancer Neg Hx    Social History   Socioeconomic History  . Marital status: Single    Spouse name: Not on file  . Number of children: Not on file  . Years of education: Not on file  . Highest education level: Not on file  Occupational History  . Occupation: [employer]  Social Needs  . Financial resource strain: Not on file  . Food insecurity:    Worry: Not on file    Inability: Not on file  . Transportation needs:    Medical: Not on file    Non-medical: Not on file  Tobacco Use  . Smoking status: Never Smoker  . Smokeless tobacco: Never Used  Substance and Sexual Activity  . Alcohol use: No    Alcohol/week: 0.0 oz  . Drug use: No  . Sexual activity: Yes    Birth control/protection: IUD  Lifestyle  . Physical activity:    Days per week: Not on file    Minutes per session: Not on file  . Stress: Not on file  Relationships  . Social connections:    Talks on phone: Not on file    Gets together: Not on file    Attends religious service: Not on file    Active member of club or organization:  Not on file    Attends meetings of clubs or organizations: Not on file    Relationship status: Not on file  . Intimate partner violence:    Fear of current or ex partner: Not on file    Emotionally abused: Not on file    Physically abused: Not on file    Forced sexual activity: Not on file  Other Topics Concern  . Not on file  Social History Narrative   Works [employer]    Single    Drinks etoh    Not smoker    No guns    Wearing seat belt    Safe in relationship    Current Meds  Medication Sig  . amitriptyline (ELAVIL) 10 MG tablet Take 10 mg by mouth at bedtime.  . Cholecalciferol 50000 units capsule Take 1 capsule (50,000 Units total) by  mouth once a week. X 6 months  . Levonorgestrel (LILETTA, 52 MG,) 19.5 MCG/DAY IUD IUD by Intrauterine route.   No Known Allergies No results found for this or any previous visit (from the past 2160 hour(s)). Objective  Body mass index is 32.1 kg/m. Wt Readings from Last 3 Encounters:  06/18/18 217 lb 6.4 oz (98.6 kg)  04/07/18 214 lb 8 oz (97.3 kg)  03/19/18 211 lb 12.8 oz (96.1 kg)   Temp Readings from Last 3 Encounters:  06/18/18 98.3 F (36.8 C) (Oral)  03/19/18 98.8 F (37.1 C) (Oral)  01/02/18 98.4 F (36.9 C) (Oral)   BP Readings from Last 3 Encounters:  06/18/18 (!) 112/56  04/07/18 111/73  03/19/18 120/68   Pulse Readings from Last 3 Encounters:  06/18/18 87  04/07/18 88  03/19/18 86    Physical Exam  Constitutional: She is oriented to person, place, and time. Vital signs are normal. She appears well-developed and well-nourished. She is cooperative.  HENT:  Head: Normocephalic and atraumatic.  Mouth/Throat: Oropharynx is clear and moist and mucous membranes are normal.  Eyes: Pupils are equal, round, and reactive to light. Conjunctivae are normal.  Cardiovascular: Normal rate, regular rhythm and normal heart sounds.  Pulmonary/Chest: Effort normal and breath sounds normal.  Neurological: She is alert and oriented to person, place, and time. Gait normal.  Skin: Skin is warm, dry and intact.  Psychiatric: She has a normal mood and affect. Her speech is normal and behavior is normal. Judgment and thought content normal. Cognition and memory are normal.  Nursing note and vitals reviewed.   Assessment   1. POTS 2. Dyslexia/ADHD  3. Endometriosis/interstial cystitis  4. HM Plan   1. Consider [facility] has EP f/u 07/2018 multi-specialist to participate in POTS care  Called clinic to see who specializes in POTS Neurology [phone]  2. Given Elna BreslowBruce Thompson name phd to see if taking new patients otherwise bring in old psychological testing x 7 years ago  insurance will not cover its $1800 3. Cont elavil 10 mg qd off Letrozole 2.5 mg qd  4.  Did not get flu shot this year  Tdap had 06/27/17 GC/C neg 08/21/17, HIV neg 05/10/15  Pap get records Dr. Dalbert GarnetBeasley  Check records HPV, Hep B  rec take vitamin D she has Rx not taking   Provider: Dr. French Anaracy McLean-Scocuzza-Internal Medicine

## 2018-06-26 ENCOUNTER — Other Ambulatory Visit: Payer: Self-pay

## 2018-06-26 ENCOUNTER — Encounter: Payer: Self-pay | Admitting: Emergency Medicine

## 2018-06-26 ENCOUNTER — Emergency Department: Payer: Managed Care, Other (non HMO)

## 2018-06-26 DIAGNOSIS — R102 Pelvic and perineal pain: Secondary | ICD-10-CM | POA: Insufficient documentation

## 2018-06-26 DIAGNOSIS — Z79899 Other long term (current) drug therapy: Secondary | ICD-10-CM | POA: Diagnosis not present

## 2018-06-26 LAB — BASIC METABOLIC PANEL
Anion gap: 5 (ref 5–15)
BUN: 9 mg/dL (ref 6–20)
CO2: 29 mmol/L (ref 22–32)
Calcium: 9.3 mg/dL (ref 8.9–10.3)
Chloride: 106 mmol/L (ref 98–111)
Creatinine, Ser: 0.77 mg/dL (ref 0.44–1.00)
GFR calc Af Amer: >60 mL/min (ref >60)
GFR calc non Af Amer: >60 mL/min (ref >60)
Glucose, Bld: 101 mg/dL — ABNORMAL HIGH (ref 70–99)
Potassium: 3.7 mmol/L (ref 3.5–5.1)
Sodium: 140 mmol/L (ref 135–145)

## 2018-06-26 LAB — CBC WITH DIFFERENTIAL/PLATELET
Basophils Absolute: 0 10*3/uL (ref 0–0.1)
Basophils Relative: 0 %
EOS PCT: 0 %
Eosinophils Absolute: 0 10*3/uL (ref 0–0.7)
HCT: 42.7 % (ref 35.0–47.0)
Hemoglobin: 14.3 g/dL (ref 12.0–16.0)
Lymphocytes Relative: 15 %
Lymphs Abs: 2 10*3/uL (ref 1.0–3.6)
MCH: 29.5 pg (ref 26.0–34.0)
MCHC: 33.5 g/dL (ref 32.0–36.0)
MCV: 88.1 fL (ref 80.0–100.0)
Monocytes Absolute: 0.9 10*3/uL (ref 0.2–0.9)
Monocytes Relative: 6 %
Neutro Abs: 10.7 10*3/uL — ABNORMAL HIGH (ref 1.4–6.5)
Neutrophils Relative %: 79 %
Platelets: 238 10*3/uL (ref 150–440)
RBC: 4.84 MIL/uL (ref 3.80–5.20)
RDW: 14.6 % — ABNORMAL HIGH (ref 11.5–14.5)
WBC: 13.7 10*3/uL — AB (ref 3.6–11.0)

## 2018-06-26 LAB — URINALYSIS, ROUTINE W REFLEX MICROSCOPIC
BILIRUBIN URINE: NEGATIVE
Glucose, UA: NEGATIVE mg/dL
HGB URINE DIPSTICK: NEGATIVE
KETONES UR: NEGATIVE mg/dL
NITRITE: NEGATIVE
PROTEIN: 30 mg/dL — AB
Specific Gravity, Urine: 1.024 (ref 1.005–1.030)
pH: 8 (ref 5.0–8.0)

## 2018-06-26 LAB — POCT PREGNANCY, URINE: Preg Test, Ur: NEGATIVE

## 2018-06-26 NOTE — ED Triage Notes (Signed)
Family to stat desk asking about wait time. Family given update. Family verbalizes understanding.  

## 2018-06-26 NOTE — ED Triage Notes (Signed)
Pt arrives POV to triage with a sudden pelvic pain last evening. Pt states that pain has been occurring all day. Pt appears is pain at this time in triage but is otherwise in NAD.

## 2018-06-27 ENCOUNTER — Emergency Department
Admission: EM | Admit: 2018-06-27 | Discharge: 2018-06-27 | Disposition: A | Discharge status: Home / Self Care | Payer: Managed Care, Other (non HMO) | Attending: Emergency Medicine | Admitting: Emergency Medicine

## 2018-06-27 DIAGNOSIS — R102 Pelvic and perineal pain: Secondary | ICD-10-CM

## 2018-06-27 LAB — CHLAMYDIA/NGC RT PCR (ARMC ONLY)
Chlamydia Tr: NOT DETECTED
N gonorrhoeae: NOT DETECTED

## 2018-06-27 MED ORDER — AZITHROMYCIN 500 MG PO TABS
1000.0000 mg | ORAL_TABLET | Freq: Once | ORAL | Status: AC
  Administered 2018-06-27: 1000 mg via ORAL
  Filled 2018-06-27: qty 2

## 2018-06-27 MED ORDER — SODIUM CHLORIDE 0.9 % IV SOLN
1.0000 g | Freq: Once | INTRAVENOUS | Status: AC
  Administered 2018-06-27: 1 g via INTRAVENOUS
  Filled 2018-06-27: qty 10

## 2018-06-27 MED ORDER — FENTANYL CITRATE (PF) 100 MCG/2ML IJ SOLN
100.0000 ug | Freq: Once | INTRAMUSCULAR | Status: AC
  Administered 2018-06-27: 100 ug via INTRAVENOUS
  Filled 2018-06-27: qty 2

## 2018-06-27 MED ORDER — DIPHENHYDRAMINE HCL 50 MG/ML IJ SOLN
50.0000 mg | Freq: Once | INTRAMUSCULAR | Status: AC
  Administered 2018-06-27: 50 mg via INTRAVENOUS
  Filled 2018-06-27: qty 1

## 2018-06-27 MED ORDER — KETOROLAC TROMETHAMINE 30 MG/ML IJ SOLN
15.0000 mg | Freq: Once | INTRAMUSCULAR | Status: AC
  Administered 2018-06-27: 15 mg via INTRAVENOUS
  Filled 2018-06-27: qty 1

## 2018-06-27 MED ORDER — HYDROCODONE-ACETAMINOPHEN 5-325 MG PO TABS: 1.0000 | ORAL_TABLET | Freq: Four times a day (QID) | ORAL | 0 refills | Status: DC | PRN

## 2018-06-27 NOTE — ED Provider Notes (Signed)
[facility] Emergency Department Provider Note  ____________________________________________   First MD Initiated Contact with Patient 06/27/18 0008     (approximate)  I have reviewed the triage vital signs and the nursing notes.   HISTORY  Chief Complaint Pelvic Pain   HPI Leah Hartman is a 22 y.o. female who self presents to the emergency department with sudden onset severe left lower quadrant/left pelvic pain that began yesterday evening.  The pain has been persistent ever since.  Nothing seems to make it better and is worse with movement.  She has a long-standing history of endometriosis as well as pots.  Her last sexual contact was about 2 weeks ago.  At that point she and her boyfriend broke up after she found that he was unfaithful to her.  She has noted no vaginal discharge and no foul smell from her vagina.  She denies dysuria frequency or hesitancy.  Her pain came on suddenly with severe sharp nonradiating.    Past Medical History:  Diagnosis Date  . Abdominal pain   . Dysmenorrhea 05/10/2015  . Endometriosis   . Gastritis    egd 04/07/15   . Nausea & vomiting   . Ovarian cyst   . Ovarian cyst rupture   . POTS (postural orthostatic tachycardia syndrome)   . Syncope and collapse     Patient Active Problem List   Diagnosis Date Noted  . Interstitial cystitis 06/18/2018  . ADHD 03/19/2018  . Mood swings 03/19/2018  . Dyslexia 03/19/2018  . POTS (postural orthostatic tachycardia syndrome) 03/19/2018  . Viral illness 01/02/2018  . Endometriosis 07/19/2015  . Personal history of perinatal problems 07/04/2015  . Chronic diarrhea 05/26/2015  . Chronic pelvic pain in female 05/10/2015  . Family history of endometriosis in first degree relative 05/10/2015  . Dysmenorrhea 05/10/2015  . Menorrhagia 05/10/2015  . Syncope 01/31/2015  . Chronic fatigue 01/31/2015  . SOB (shortness of breath) 09/27/2014  . PSVT (paroxysmal supraventricular  tachycardia) (HCC) 09/27/2014    Past Surgical History:  Procedure Laterality Date  . APPENDECTOMY    . ESOPHAGOGASTRODUODENOSCOPY N/A 04/07/2015   Procedure: ESOPHAGOGASTRODUODENOSCOPY (EGD);  Surgeon: Midge Miniumarren Wohl, MD;  [facility];  Service: Gastroenterology;  Laterality: N/A;  . INTRAUTERINE DEVICE (IUD) INSERTION N/A 05/15/2015   Procedure: INTRAUTERINE DEVICE (IUD) INSERTION;  Surgeon: Herold HarmsMartin A Defrancesco, MD;  Location: [facility];  Service: Gynecology;  Laterality: N/A;  . LAPAROSCOPY N/A 05/15/2015   Procedure: with excision and fulgeration of endometriosis;  Surgeon: Herold HarmsMartin A Defrancesco, MD;  Location: [facility];  Service: Gynecology;  Laterality: N/A;  with excision and fulgeration of endometriosis  . OVARIAN CYST SURGERY Bilateral   . TONSILLECTOMY AND ADENOIDECTOMY    . TYMPANOSTOMY TUBE PLACEMENT      Prior to Admission medications   Medication Sig Start Date End Date Taking? Authorizing Provider  amitriptyline (ELAVIL) 10 MG tablet Take 10 mg by mouth at bedtime.    [provider]  Cholecalciferol 50000 units capsule Take 1 capsule (50,000 Units total) by mouth once a week. X 6 months 03/19/18   McLean-Scocuzza, Pasty Spillersracy N, MD  HYDROcodone-acetaminophen (NORCO) 5-325 MG tablet Take 1 tablet by mouth every 6 (six) hours as needed for up to 7 doses for severe pain. 06/27/18   Merrily Brittleifenbark, Ardythe Klute, MD  Levonorgestrel (LILETTA, 52 MG,) 19.5 MCG/DAY IUD IUD by Intrauterine route.    [provider]    Allergies Patient has no known allergies.  Family History  Problem Relation  Age of Onset  . Hypertension Mother   . Diabetes Father   . Learning disabilities Father   . Birth defects Maternal Grandmother   . Birth defects Maternal Grandfather   . Birth defects Paternal Grandmother   . Birth defects Paternal Grandfather   . Colon cancer Neg Hx   . Ovarian cancer Neg Hx   . Breast cancer Neg Hx     Social History Social History   Tobacco Use  .  Smoking status: Never Smoker  . Smokeless tobacco: Never Used  Substance Use Topics  . Alcohol use: No    Alcohol/week: 0.0 oz  . Drug use: No    Review of Systems Constitutional: No fever/chills Eyes: No visual changes. ENT: No sore throat. Cardiovascular: Denies chest pain. Respiratory: Denies shortness of breath. Gastrointestinal: Positive for abdominal pain.  No nausea, no vomiting.  No diarrhea.  No constipation. Genitourinary: Negative for dysuria. Musculoskeletal: Negative for back pain. Skin: Negative for rash. Neurological: Negative for headaches, focal weakness or numbness.   ____________________________________________   PHYSICAL EXAM:  VITAL SIGNS: ED Triage Vitals [06/26/18 1919]  Enc Vitals Group     BP 125/75     Pulse Rate 76     Resp 18     Temp 98 F (36.7 C)     Temp Source Oral     SpO2 97 %     Weight 210 lb (95.3 kg)     Height 5\' 9"  (1.753 m)     Head Circumference      Peak Flow      Pain Score 10     Pain Loc      Pain Edu?      Excl. in GC?     Constitutional: Alert and oriented x4 tearful and obviously uncomfortable appearing Eyes: PERRL EOMI. Head: Atraumatic. Nose: No congestion/rhinnorhea. Mouth/Throat: No trismus Neck: No stridor.   Cardiovascular: Normal rate, regular rhythm. Grossly normal heart sounds.  Good peripheral circulation. Respiratory: Normal respiratory effort.  No retractions. Lungs CTAB and moving good air Gastrointestinal: Soft quite tender suprapubic on the left with no frank peritonitis Pelvic exam performed with female chaperone: Normal external exam scant discharge also closed left adnexal tenderness Musculoskeletal: No lower extremity edema   Neurologic:  Normal speech and language. No gross focal neurologic deficits are appreciated. Skin:  Skin is warm, dry and intact. No rash noted. Psychiatric: Mood and affect are normal. Speech and behavior are  normal.    ____________________________________________   DIFFERENTIAL includes but not limited to  Gonorrhea, chlamydia, pelvic inflammatory disease, cervicitis, ovarian torsion, ovarian cyst, endometriosis ____________________________________________   LABS (all labs ordered are listed, but only abnormal results are displayed)  Labs Reviewed  URINALYSIS, ROUTINE W REFLEX MICROSCOPIC - Abnormal; Notable for the following components:      Result Value   Color, Urine YELLOW (*)    APPearance HAZY (*)    Protein, ur 30 (*)    Leukocytes, UA TRACE (*)    Bacteria, UA RARE (*)    All other components within normal limits  CBC WITH DIFFERENTIAL/PLATELET - Abnormal; Notable for the following components:   WBC 13.7 (*)    RDW 14.6 (*)    Neutro Abs 10.7 (*)    All other components within normal limits  BASIC METABOLIC PANEL - Abnormal; Notable for the following components:   Glucose, Bld 101 (*)    All other components within normal limits  CHLAMYDIA/NGC RT PCR ([facility] ONLY)  HIV ANTIBODY (  ROUTINE TESTING)  RPR  POC URINE PREG, ED  POCT PREGNANCY, URINE    Lab work reviewed by me shows slightly elevated white count which is nonspecific and could be secondary to pain __________________________________________  EKG   ____________________________________________  RADIOLOGY  Pelvic ultrasound reviewed by me with no acute disease ____________________________________________   PROCEDURES  Procedure(s) performed: no  Procedures  Critical Care performed: no  ____________________________________________   INITIAL IMPRESSION / ASSESSMENT AND PLAN / ED COURSE  Pertinent labs & imaging results that were available during my care of the patient were reviewed by me and considered in my medical decision making (see chart for details).   As part of my medical decision making, I reviewed the following data within the electronic MEDICAL RECORD NUMBER History obtained from  family if available, nursing notes, old chart and ekg, as well as notes from prior ED visits.  The patient comes in obviously very uncomfortable appearing so 100 mcg of fentanyl given along with 15 mg of Toradol.  She had near complete improvement in her symptoms.  Her pelvic exam is unremarkable however given her recently unfaithful boyfriend there is some concern for gonorrhea and chlamydia.  She consents to treatment with ceftriaxone and azithromycin now.  She did have a mild allergic reaction to the fentanyl so was given 50 of Benadryl with improvement in her symptoms.  She is discharged home with a short course of hydrocodone and OB gynecology follow-up.      ____________________________________________   FINAL CLINICAL IMPRESSION(S) / ED DIAGNOSES  Final diagnoses:  Pelvic pain in female      NEW MEDICATIONS STARTED DURING THIS VISIT:  Discharge Medication List as of 06/27/2018  1:40 AM    START taking these medications   Details  HYDROcodone-acetaminophen (NORCO) 5-325 MG tablet Take 1 tablet by mouth every 6 (six) hours as needed for up to 7 doses for severe pain., Starting Sat 06/27/2018, Print         Note:  This document was prepared using Dragon voice recognition software and may include unintentional dictation errors.     Merrily Brittle, MD 06/29/18 [phone]

## 2018-06-27 NOTE — ED Notes (Signed)
ED Provider at bedside. 

## 2018-06-27 NOTE — ED Notes (Signed)
Pt reports abdominal pain since 22 y/o, presents now with abdominal c/o left and right lower abdominal pain, cramping, 10/10 pain  IUD for last 6 months, LMP 4 months ago   Pt reports hx of possible ovarian torsion, appendectomy, pt reports hx POTTS, last intercourse June 30th,

## 2018-06-27 NOTE — ED Notes (Signed)
Peripheral IV discontinued. Catheter intact. No signs of infiltration or redness. Gauze applied to IV site.   Discharge instructions reviewed with patient. Questions fielded by this RN. Patient verbalizes understanding of instructions. Patient discharged home in stable condition per provider . No acute distress noted at time of discharge.   

## 2018-06-27 NOTE — ED Notes (Signed)
Family at bedside. 

## 2018-06-27 NOTE — Discharge Instructions (Signed)
It was a pleasure to take care of you today, and thank you for coming to our emergency department.  If you have any questions or concerns before leaving please ask the nurse to grab me and I'm more than happy to go through your aftercare instructions again.  If you were prescribed any opioid pain medication today such as Norco, Vicodin, Percocet, morphine, hydrocodone, or oxycodone please make sure you do not drive when you are taking this medication as it can alter your ability to drive safely.  If you have any concerns once you are home that you are not improving or are in fact getting worse before you can make it to your follow-up appointment, please do not hesitate to call [phone] and come back for further evaluation.  Merrily BrittleNeil Kerry Chisolm, MD  Results for orders placed or performed during the hospital encounter of 06/27/18  Urinalysis, Routine w reflex microscopic  Result Value Ref Range   Color, Urine YELLOW (A) YELLOW   APPearance HAZY (A) CLEAR   Specific Gravity, Urine 1.024 1.005 - 1.030   pH 8.0 5.0 - 8.0   Glucose, UA NEGATIVE NEGATIVE mg/dL   Hgb urine dipstick NEGATIVE NEGATIVE   Bilirubin Urine NEGATIVE NEGATIVE   Ketones, ur NEGATIVE NEGATIVE mg/dL   Protein, ur 30 (A) NEGATIVE mg/dL   Nitrite NEGATIVE NEGATIVE   Leukocytes, UA TRACE (A) NEGATIVE   RBC / HPF 6-10 0 - 5 RBC/hpf   WBC, UA 6-10 0 - 5 WBC/hpf   Bacteria, UA RARE (A) NONE SEEN   Squamous Epithelial / LPF 6-10 0 - 5   Mucus PRESENT   CBC with Differential  Result Value Ref Range   WBC 13.7 (H) 3.6 - 11.0 K/uL   RBC 4.84 3.80 - 5.20 MIL/uL   Hemoglobin 14.3 12.0 - 16.0 g/dL   HCT 21.342.7 08.635.0 - 57.847.0 %   MCV 88.1 80.0 - 100.0 fL   MCH 29.5 26.0 - 34.0 pg   MCHC 33.5 32.0 - 36.0 g/dL   RDW 46.914.6 (H) 62.911.5 - 52.814.5 %   Platelets 238 150 - 440 K/uL   Neutrophils Relative % 79 %   Neutro Abs 10.7 (H) 1.4 - 6.5 K/uL   Lymphocytes Relative 15 %   Lymphs Abs 2.0 1.0 - 3.6 K/uL   Monocytes Relative 6 %   Monocytes Absolute  0.9 0.2 - 0.9 K/uL   Eosinophils Relative 0 %   Eosinophils Absolute 0.0 0 - 0.7 K/uL   Basophils Relative 0 %   Basophils Absolute 0.0 0 - 0.1 K/uL  Basic metabolic panel  Result Value Ref Range   Sodium 140 135 - 145 mmol/L   Potassium 3.7 3.5 - 5.1 mmol/L   Chloride 106 98 - 111 mmol/L   CO2 29 22 - 32 mmol/L   Glucose, Bld 101 (H) 70 - 99 mg/dL   BUN 9 6 - 20 mg/dL   Creatinine, Ser 4.130.77 0.44 - 1.00 mg/dL   Calcium 9.3 8.9 - 24.410.3 mg/dL   GFR calc non Af Amer >60 >60 mL/min   GFR calc Af Amer >60 >60 mL/min   Anion gap 5 5 - 15  Pregnancy, urine POC  Result Value Ref Range   Preg Test, Ur NEGATIVE NEGATIVE   [place] Pelvis Transvanginal Non-ob (tv Only)  Result Date: 06/26/2018 CLINICAL DATA:  Pelvic pain for 1 day EXAM: TRANSABDOMINAL AND TRANSVAGINAL ULTRASOUND OF PELVIS TECHNIQUE: Both transabdominal and transvaginal ultrasound examinations of the pelvis were performed. Transabdominal technique was  performed for global imaging of the pelvis including uterus, ovaries, adnexal regions, and pelvic cul-de-sac. It was necessary to proceed with endovaginal exam following the transabdominal exam to visualize the uterus endometrium and adnexa. COMPARISON:  07/24/2017 FINDINGS: Uterus Measurements: 8.1 x 3.7 x 3.6 cm. No fibroids or other mass visualized. Endometrium Thickness: 4.7 mm.  Intrauterine device in expected location. Right ovary Measurements: 2.9 x 2.1 x 2.4 cm. Normal appearance/no adnexal mass. Left ovary Measurements: 3.2 x 1.7 x 2 cm.  Normal appearance/no adnexal mass. Other findings No abnormal free fluid. IMPRESSION: Negative pelvic ultrasound. Intrauterine device appears appropriately position by sonography. Electronically Signed   By: Jasmine Pang M.D.   On: 06/26/2018 21:18   US Pelvis Complete  Result Date: 06/26/2018 CLINICAL DATA:  Pelvic pain for 1 day EXAM: TRANSABDOMINAL AND TRANSVAGINAL ULTRASOUND OF PELVIS TECHNIQUE: Both transabdominal and transvaginal ultrasound  examinations of the pelvis were performed. Transabdominal technique was performed for global imaging of the pelvis including uterus, ovaries, adnexal regions, and pelvic cul-de-sac. It was necessary to proceed with endovaginal exam following the transabdominal exam to visualize the uterus endometrium and adnexa. COMPARISON:  07/24/2017 FINDINGS: Uterus Measurements: 8.1 x 3.7 x 3.6 cm. No fibroids or other mass visualized. Endometrium Thickness: 4.7 mm.  Intrauterine device in expected location. Right ovary Measurements: 2.9 x 2.1 x 2.4 cm. Normal appearance/no adnexal mass. Left ovary Measurements: 3.2 x 1.7 x 2 cm.  Normal appearance/no adnexal mass. Other findings No abnormal free fluid. IMPRESSION: Negative pelvic ultrasound. Intrauterine device appears appropriately position by sonography. Electronically Signed   By: Jasmine Pang M.D.   On: 06/26/2018 21:18

## 2018-06-27 NOTE — ED Notes (Signed)
Pt reports itching. 

## 2018-06-28 LAB — HIV ANTIBODY (ROUTINE TESTING W REFLEX): HIV SCREEN 4TH GENERATION: NONREACTIVE

## 2018-06-28 LAB — RPR: RPR: NONREACTIVE

## 2018-08-12 ENCOUNTER — Encounter: Payer: Self-pay | Admitting: Family Medicine

## 2018-08-12 ENCOUNTER — Other Ambulatory Visit: Payer: Self-pay

## 2018-08-12 ENCOUNTER — Ambulatory Visit: Payer: Managed Care, Other (non HMO) | Admitting: Family Medicine

## 2018-08-12 VITALS — BP 116/74 | HR 95 | Temp 98.2°F | Wt 212.0 lb

## 2018-08-12 DIAGNOSIS — R0981 Nasal congestion: Secondary | ICD-10-CM

## 2018-08-12 DIAGNOSIS — J029 Acute pharyngitis, unspecified: Secondary | ICD-10-CM | POA: Diagnosis not present

## 2018-08-12 DIAGNOSIS — H6593 Unspecified nonsuppurative otitis media, bilateral: Secondary | ICD-10-CM | POA: Diagnosis not present

## 2018-08-12 LAB — POCT RAPID STREP A (OFFICE): RAPID STREP A SCREEN: NEGATIVE

## 2018-08-12 MED ORDER — AMOXICILLIN-POT CLAVULANATE 875-125 MG PO TABS
1.0000 | ORAL_TABLET | Freq: Two times a day (BID) | ORAL | 0 refills | Status: DC
Start: 2018-08-12 — End: 2018-11-20

## 2018-08-12 NOTE — Progress Notes (Signed)
   Subjective:    Patient ID: Leah Hartman, female    DOB: 03/31/1996, 22 y.o.   MRN: [id number]  HPI   Patient presents to clinic complaining of ear pain, sore throat, nasal congestion, fatigue for 4 to 5 days.  States she has been using her Claritin and and a nasal spray without much effect.  States symptoms have become progressively worse over the past 4 to 5 days.  No known sick contacts.  No fever or chills.  No body aches.  Appetite has been normal.   Patient Active Problem List   Diagnosis Date Noted  . Interstitial cystitis 06/18/2018  . ADHD 03/19/2018  . Mood swings 03/19/2018  . Dyslexia 03/19/2018  . POTS (postural orthostatic tachycardia syndrome) 03/19/2018  . Viral illness 01/02/2018  . Endometriosis 07/19/2015  . Personal history of perinatal problems 07/04/2015  . Chronic diarrhea 05/26/2015  . Chronic pelvic pain in female 05/10/2015  . Family history of endometriosis in first degree relative 05/10/2015  . Dysmenorrhea 05/10/2015  . Menorrhagia 05/10/2015  . Syncope 01/31/2015  . Chronic fatigue 01/31/2015  . SOB (shortness of breath) 09/27/2014  . PSVT (paroxysmal supraventricular tachycardia) (HCC) 09/27/2014   Social History   Tobacco Use  . Smoking status: Never Smoker  . Smokeless tobacco: Never Used  Substance Use Topics  . Alcohol use: No    Alcohol/week: 0.0 standard drinks    Review of Systems  Constitutional: +fatigue. Negative for chills, and fever.  HENT: Positve for congestion, ear pain, sinus pain and sore throat.   Eyes: Negative.   Respiratory: Negative for cough, shortness of breath and wheezing.   Cardiovascular: Negative for chest pain, palpitations and leg swelling.  Gastrointestinal: Negative for abdominal pain, diarrhea, nausea and vomiting.  Genitourinary: Negative for dysuria, frequency and urgency.  Musculoskeletal: Negative for arthralgias and myalgias.  Skin: Negative for color change, pallor and rash.  Neurological:  Negative for syncope, light-headedness and headaches.  Psychiatric/Behavioral: The patient is not nervous/anxious.       Objective:   Physical Exam  Constitutional: She appears well-developed and well-nourished. No distress.  HENT:  Head: Normocephalic and atraumatic.  Eyes: EOM are normal. No scleral icterus. No discharge.  Nose/throat: +inflamed nares and severe post nasal drip.  Ears: Bilateral bulging and very red TMs with effusion Neck: Normal range of motion. Neck supple. No tracheal deviation present.  Cardiovascular: Normal rate, regular rhythm and normal heart sounds.  Pulmonary/Chest: Effort normal and breath sounds normal. No respiratory distress. She has no wheezes. She has no rales.  Neurological: She is alert and oriented to person, place, and time.  Gait normal  Skin: Skin is warm and dry. No pallor.  Psychiatric: She has a normal mood and affect. Her behavior is normal.   Nursing note and vitals reviewed.    Vitals:   08/12/18 1159  BP: 116/74  Pulse: 95  Temp: 98.2 F (36.8 C)  SpO2: 95%    Assessment & Plan:    Bilateral otitis media- patient will take Augmentin twice daily for 10 days.  She will continue Claritin daily.  Also advised to get over-the-counter Sudafed to help get congestion draining.  Increase fluids, rest, use Tylenol Motrin as needed for pain/fever.  Pain in throat- rapid strep in clinic is negative.  Suspect pain in throat is related to postnasal drip.  Follow-up in clinic as needed

## 2018-09-03 IMAGING — US US TRANSVAGINAL NON-OB
1 series · 14 of 25 positions shown · non-contrast
Comparison: 07/24/2017

CLINICAL DATA: Pelvic pain for 1 day

EXAM:
TRANSABDOMINAL AND TRANSVAGINAL ULTRASOUND OF PELVIS
TECHNIQUE: Both transabdominal and transvaginal ultrasound examinations of the
pelvis were performed. Transabdominal technique was performed for
global imaging of the pelvis including uterus, ovaries, adnexal
regions, and pelvic cul-de-sac. It was necessary to proceed with
endovaginal exam following the transabdominal exam to visualize the
uterus endometrium and adnexa.

[Series 1: us transvaginal non-ob · 14 of 88 slices shown]
[im 1/88]
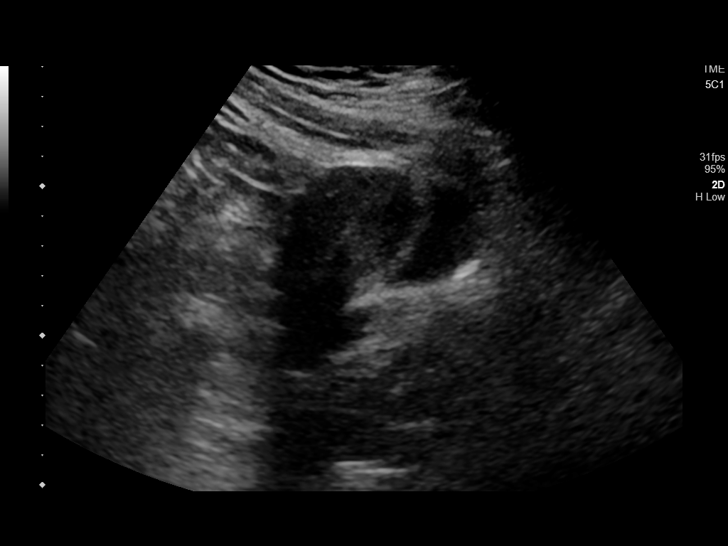
[im 8/88]
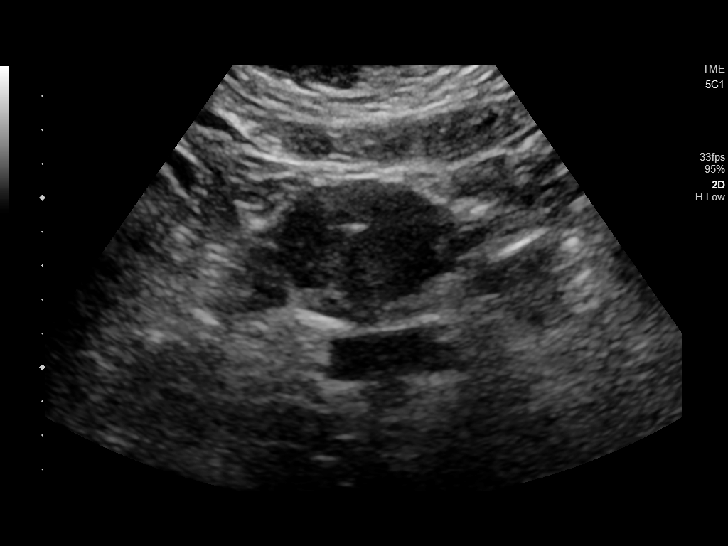
[im 15/88]
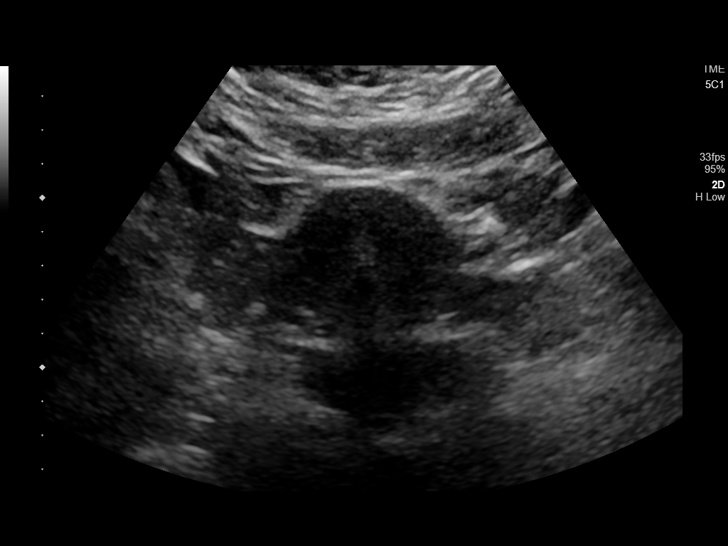
[im 22/88]
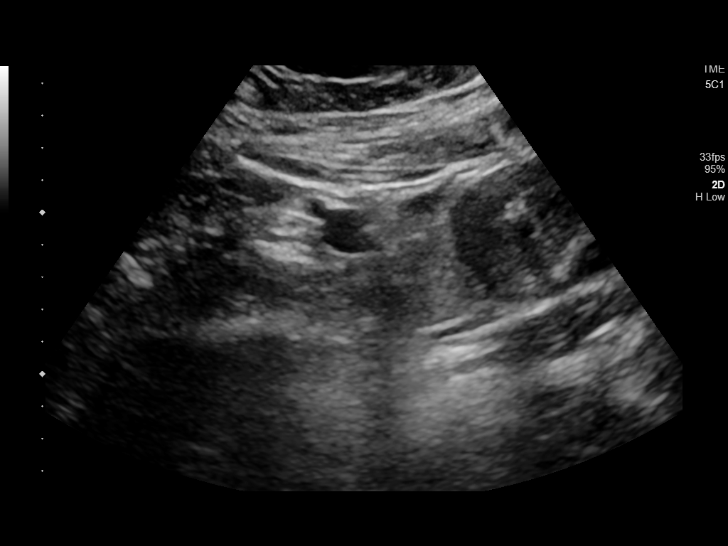
[im 30/88]
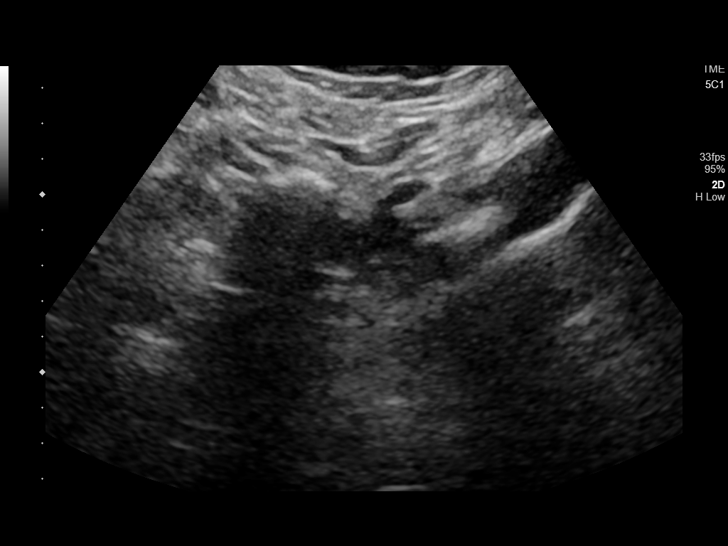
[im 33/88]
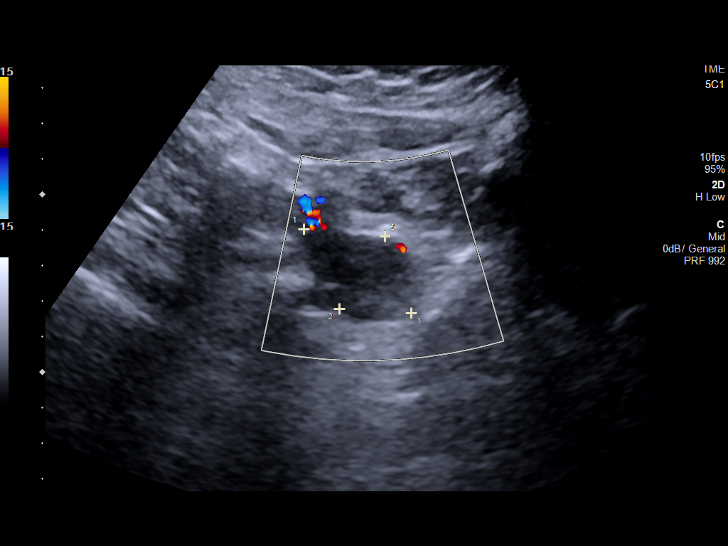
[im 40/88]
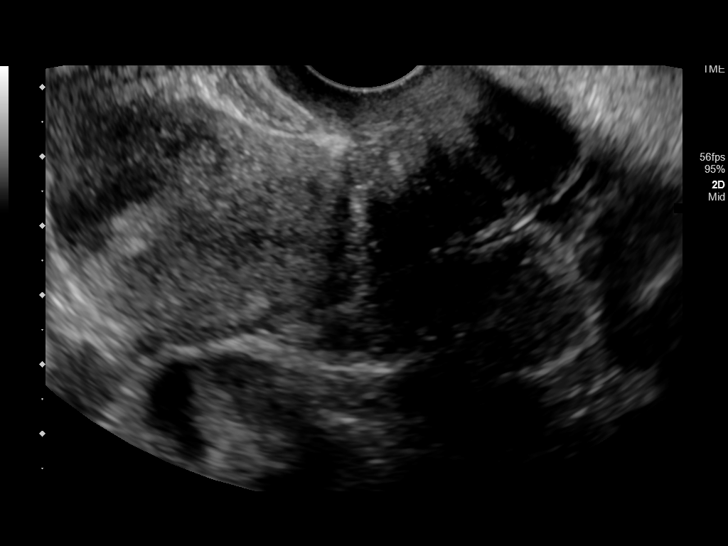
[im 48/88]
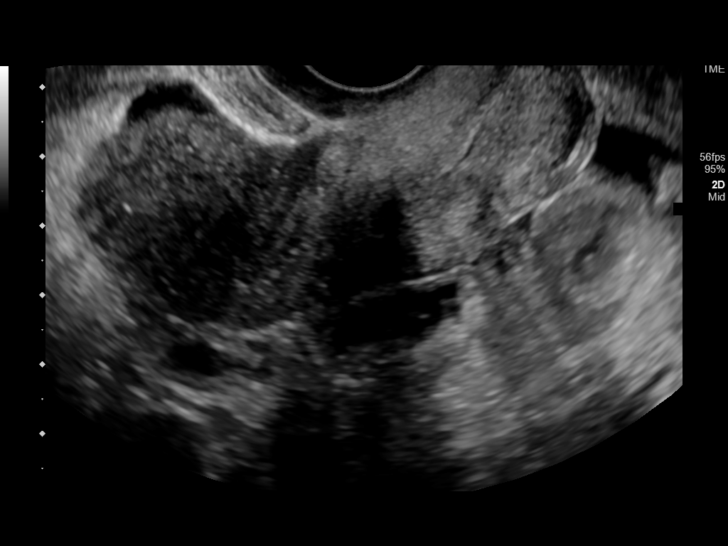
[im 55/88]
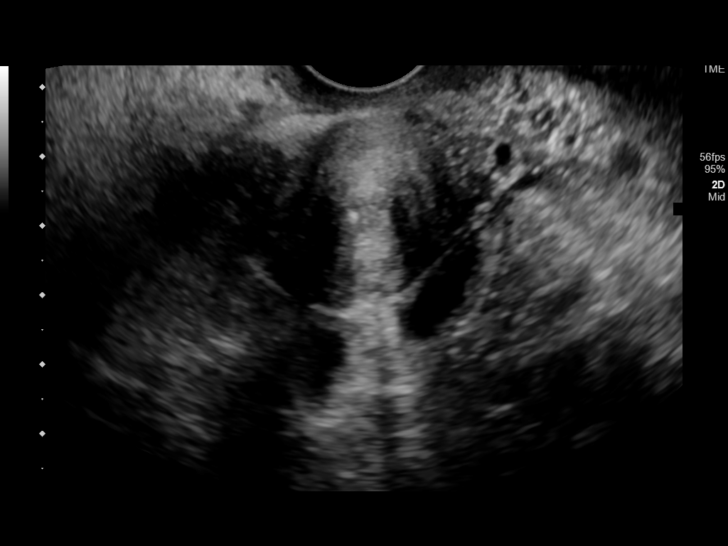
[im 59/88]
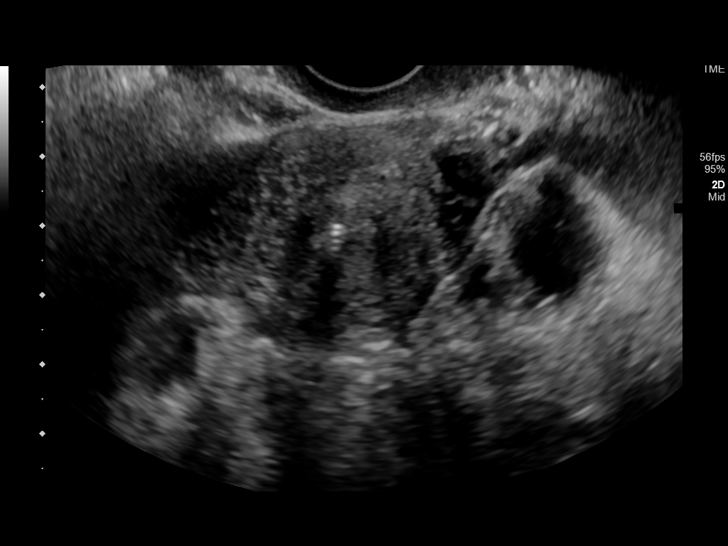
[im 66/88]
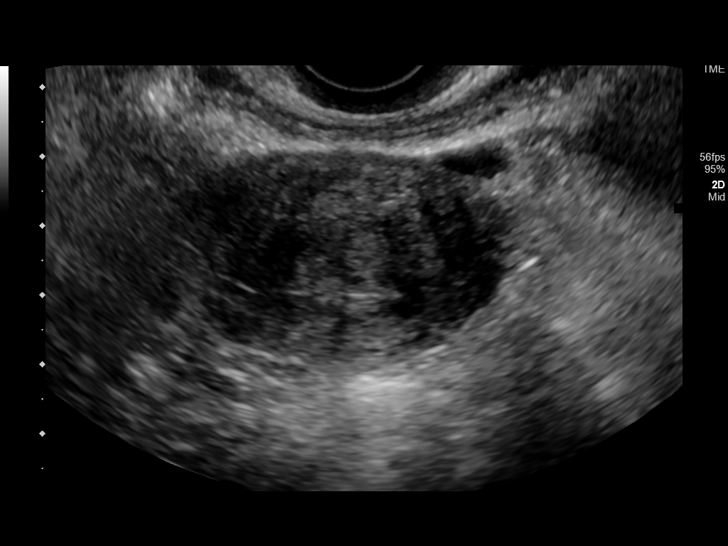
[im 73/88]
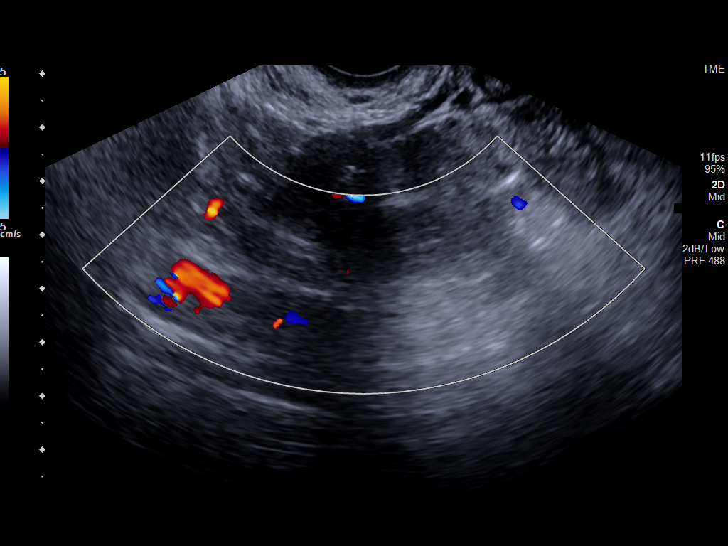
[im 80/88]
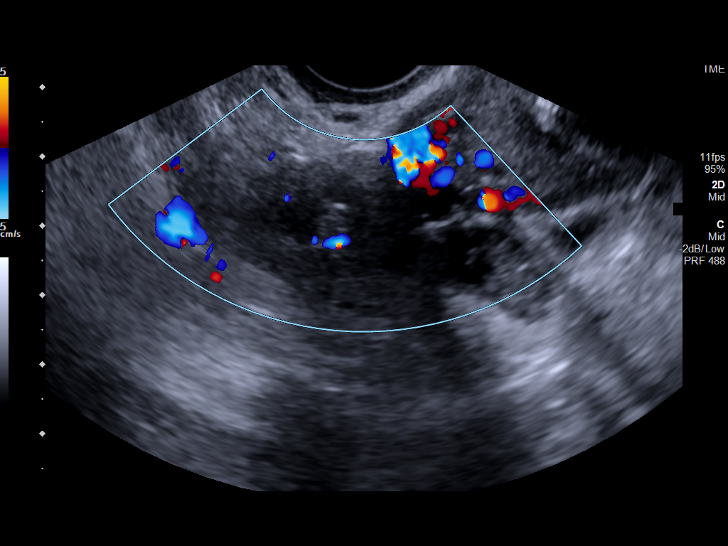
[im 88/88]
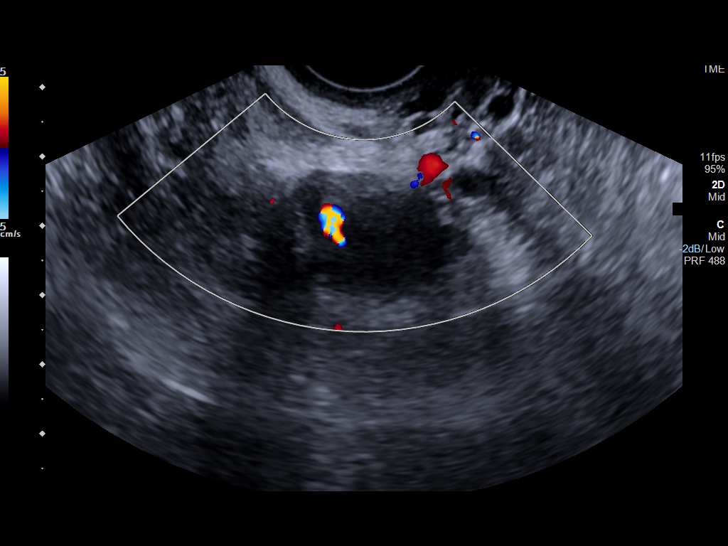

[14 of 25 positions shown; findings below may reference images not displayed]

FINDINGS: Uterus

Measurements: 8.1 x 3.7 x 3.6 cm. No fibroids or other mass
visualized.

Endometrium

Thickness: 4.7 mm.  Intrauterine device in expected location.

Right ovary

Measurements: 2.9 x 2.1 x 2.4 cm. Normal appearance/no adnexal mass.

Left ovary

Measurements: 3.2 x 1.7 x 2 cm.  Normal appearance/no adnexal mass.

Other findings

No abnormal free fluid.
IMPRESSION: Negative pelvic ultrasound. Intrauterine device appears
appropriately position by sonography.

## 2018-11-19 ENCOUNTER — Ambulatory Visit: Payer: Self-pay | Admitting: *Deleted

## 2018-11-19 NOTE — Telephone Encounter (Signed)
Pt calling with complaints of having congestion, left ear pain, and sore throat and feeling that drainage is going down her throat. Pt states that she has had the symptoms for the past couple of days but they have worsened. Pt states she has a history of POTS syndrome and feels like she is dehydrated. Pt states she was lightheaded earlier today but denies feeling lightheaded at this time. Pt states she has been able to urinate today but only once today and has been able to drink fluids. Pt states she has ben able to drink 2- 12 oz drinks today and Gatorade.No appt available with PCP on today or tomorrow. Pt scheduled for appt on 12/27 and advised to seek treatment in Urgent Care with worsening symptoms. Pt verbalized understanding.   Reason for Disposition . Earache  Answer Assessment - Initial Assessment Questions 1. ONSET: "When did the nasal discharge start?"       A couple of days ago and has gotten worse 2. AMOUNT: "How much discharge is there?"      No assessed 3. COUGH: "Do you have a cough?" If yes, ask: "Describe the color of your sputum" (clear, white, yellow, green)     No complaints of cough 4. RESPIRATORY DISTRESS: "Describe your breathing."      No complaints of difficulty breathing 5. FEVER: "Do you have a fever?" If so, ask: "What is your temperature, how was it measured, and when did it start?"     No complaints of fever 6. SEVERITY: "Overall, how bad are you feeling right now?" (e.g., doesn't interfere with normal activities, staying home from school/work, staying in bed)      Pt states she did not go to work today due to current symptoms 7. OTHER SYMPTOMS: "Do you have any other symptoms?" (e.g., sore throat, earache, wheezing, vomiting)     Sore throat, left ear ache and feeling light headed at times  Answer Assessment - Initial Assessment Questions 1. DESCRIPTION: "Describe your dizziness."     Feels lightheaded at times 2. LIGHTHEADED: "Do you feel lightheaded?" (e.g.,  somewhat faint, woozy, weak upon standing)     yes 3. VERTIGO: "Do you feel like either you or the room is spinning or tilting?" (i.e. vertigo)     No  4. SEVERITY: "How bad is it?"  "Do you feel like you are going to faint?" "Can you stand and walk?"   - MILD - walking normally   - MODERATE - interferes with normal activities (e.g., work, school)    - SEVERE - unable to stand, requires support to walk, feels like passing out now.      Not lightheaded at this time, pt states she felt lightheaded earlier today 5. ONSET:  "When did the dizziness begin?"     A couple of days ago 6. AGGRAVATING FACTORS: "Does anything make it worse?" (e.g., standing, change in head position)     Not assessed, not feeling lightheaded at this time 7. HEART RATE: "Can you tell me your heart rate?" "How many beats in 15 seconds?"  (Note: not all patients can do this)       Not assessed 8. CAUSE: "What do you think is causing the dizziness?"     dehydration 9. RECURRENT SYMPTOM: "Have you had dizziness before?" If so, ask: "When was the last time?" "What happened that time?"     Yes pt states that she has a history of POTS syndrome 10. OTHER SYMPTOMS: "Do you have any other symptoms?" (  e.g., fever, chest pain, vomiting, diarrhea, bleeding)      Congestion, Sore throat with earache  Protocols used: COMMON COLD-A-AH, DIZZINESS - LIGHTHEADEDNESS-A-AH

## 2018-11-20 ENCOUNTER — Ambulatory Visit: Payer: Managed Care, Other (non HMO) | Admitting: Internal Medicine

## 2018-11-20 ENCOUNTER — Encounter: Payer: Self-pay | Admitting: Internal Medicine

## 2018-11-20 DIAGNOSIS — J0110 Acute frontal sinusitis, unspecified: Secondary | ICD-10-CM | POA: Diagnosis not present

## 2018-11-20 MED ORDER — AMOXICILLIN-POT CLAVULANATE 875-125 MG PO TABS: 1.0000 | ORAL_TABLET | Freq: Two times a day (BID) | ORAL | 0 refills | Status: DC

## 2018-11-20 MED ORDER — GUAIFENESIN-CODEINE 100-10 MG/5ML PO SYRP: 5.0000 mL | ORAL_SOLUTION | Freq: Three times a day (TID) | ORAL | 0 refills | Status: DC | PRN

## 2018-11-20 MED ORDER — PREDNISONE 10 MG PO TABS: ORAL_TABLET | ORAL | 0 refills | Status: DC

## 2018-11-20 NOTE — Progress Notes (Signed)
Subjective:  Patient ID: Leah Hartman, female    DOB: 06/29/1996  Age: 22 y.o. MRN: [id number]  CC: The encounter diagnosis was Acute non-recurrent frontal sinusitis.  HPI Leah Shadowmily Mei Bronder presents for evaluation of upper respiratory symptoms.  patient has had cough ,  Congestion and headache for 5 days  No fevers, or body aches.  No recent travel some exposure to sick juvenile relatives over the holidays.  symptoms started with sneezing and sore throat.  Currently coughing uncontrolled at night and now having bilateral ear pain ,  Left greater than right .   Outpatient Medications Prior to Visit  Medication Sig Dispense Refill  . amitriptyline (ELAVIL) 10 MG tablet Take 10 mg by mouth at bedtime.    . Levonorgestrel (LILETTA, 52 MG,) 19.5 MCG/DAY IUD IUD by Intrauterine route.    Marland Kitchen. amoxicillin-clavulanate (AUGMENTIN) 875-125 MG tablet Take 1 tablet by mouth 2 (two) times daily. (Patient not taking: Reported on 11/20/2018) 20 tablet 0  . Cholecalciferol 50000 units capsule Take 1 capsule (50,000 Units total) by mouth once a week. X 6 months (Patient not taking: Reported on 11/20/2018) 13 capsule 1  . HYDROcodone-acetaminophen (NORCO) 5-325 MG tablet Take 1 tablet by mouth every 6 (six) hours as needed for up to 7 doses for severe pain. (Patient not taking: Reported on 08/12/2018) 7 tablet 0   No facility-administered medications prior to visit.     Review of Systems;  Patient denies fevers unintentional weight loss, skin rash, eye pain, , dysphagia,  hemoptysis , cough, dyspnea, wheezing, chest pain, palpitations, orthopnea, edema, abdominal pain, nausea, melena, diarrhea, constipation, flank pain, dysuria, hematuria, urinary  Frequency, nocturia, numbness, tingling, seizures,  Focal weakness, Loss of consciousness,  Tremor, insomnia, depression, anxiety, and suicidal ideation.      Objective:  BP 100/72 (BP Location: Left Arm, Patient Position: Sitting, Cuff Size: Normal)   Pulse  95   Temp 98.3 F (36.8 C) (Oral)   Resp 16   Ht 5\' 9"  (1.753 m)   Wt 212 lb 9.6 oz (96.4 kg)   SpO2 97%   BMI 31.40 kg/m   BP Readings from Last 3 Encounters:  11/20/18 100/72  08/12/18 116/74  06/27/18 119/85    Wt Readings from Last 3 Encounters:  11/20/18 212 lb 9.6 oz (96.4 kg)  08/12/18 212 lb (96.2 kg)  06/26/18 210 lb (95.3 kg)    General appearance: alert, cooperative and appears  acutely ill  Ears: normal right TM,  Left TM erythematous and cloudy . No bulging  Face; left frontal sinus tenderness to palpation Throat: lips, mucosa, and tongue normal; teeth and gums normal Neck: left cervical lymphadenopathy, no carotid bruit, supple, symmetrical, trachea midline and thyroid not enlarged, symmetric, no tenderness/mass/nodules Back: symmetric, no curvature. ROM normal. No CVA tenderness. Lungs: clear to auscultation bilaterally Heart: regular rate and rhythm, S1, S2 normal, no murmur, click, rub or gallop   Assessment & Plan:   Problem List Items Addressed This Visit    Sinusitis, acute frontal    With left sided otitis media .  Empiric Augmentin, prd=ednisone taper,  And cough suppressant.  Probiotic advised.       Relevant Medications   amoxicillin-clavulanate (AUGMENTIN) 875-125 MG tablet   predniSONE (DELTASONE) 10 MG tablet   guaiFENesin-codeine (CHERATUSSIN AC) 100-10 MG/5ML syrup      I have discontinued Ashantee M. Charter's Cholecalciferol, HYDROcodone-acetaminophen, and amoxicillin-clavulanate. I am also having her start on amoxicillin-clavulanate and predniSONE. Additionally, I am having her  maintain her Levonorgestrel, amitriptyline, and guaiFENesin-codeine.  Meds ordered this encounter  Medications  . amoxicillin-clavulanate (AUGMENTIN) 875-125 MG tablet    Sig: Take 1 tablet by mouth 2 (two) times daily.    Dispense:  14 tablet    Refill:  0  . predniSONE (DELTASONE) 10 MG tablet    Sig: 6 tablets on Day 1 , then reduce by 1 tablet daily until  gone    Dispense:  21 tablet    Refill:  0  . DISCONTD: guaiFENesin-codeine (CHERATUSSIN AC) 100-10 MG/5ML syrup    Sig: Take 5 mLs by mouth 3 (three) times daily as needed for cough.    Dispense:  120 mL    Refill:  0  . guaiFENesin-codeine (CHERATUSSIN AC) 100-10 MG/5ML syrup    Sig: Take 5 mLs by mouth 3 (three) times daily as needed for cough.    Dispense:  120 mL    Refill:  0    Medications Discontinued During This Encounter  Medication Reason  . amoxicillin-clavulanate (AUGMENTIN) 875-125 MG tablet Completed Course  . Cholecalciferol 50000 units capsule Patient has not taken in last 30 days  . HYDROcodone-acetaminophen (NORCO) 5-325 MG tablet Patient has not taken in last 30 days  . guaiFENesin-codeine (CHERATUSSIN AC) 100-10 MG/5ML syrup     Follow-up: No follow-ups on file.   Sherlene Shamseresa L Vaneta Hammontree, MD

## 2018-11-20 NOTE — Patient Instructions (Signed)
I am treating you for sinusitis/otitis media which I resulted  from your viral infection due to  persistent sinus congestion.   I am prescribing an antibiotic (augmentin  and a 6 day prednisone taper  To manage the infection and the inflammation in your ear/sinuses.   The prednisone should be taken as follows:  6 tablets all at once on Day 1,  Then taper by 1 tablet each day until all 21 tablets are gone (takes 6 days)   I also advise use of the following OTC meds to help with your other symptoms.   Take generic OTC benadryl 25 mg every 8 hours for the drainage,  Sudafed PE  10 to 30 mg every 8 hours for the congestion, you may substitute Afrin nasal spray for the nighttime dose of sudafed PE  If needed to prevent insomnia.  Use cheratussin with codeine   FOR THE COUGH.  Gargle with salt water as needed for sore throat.

## 2018-11-21 DIAGNOSIS — J0110 Acute frontal sinusitis, unspecified: Secondary | ICD-10-CM | POA: Insufficient documentation

## 2018-11-21 NOTE — Assessment & Plan Note (Signed)
With left sided otitis media .  Empiric Augmentin, prd=ednisone taper,  And cough suppressant.  Probiotic advised.

## 2018-12-22 ENCOUNTER — Ambulatory Visit: Payer: Managed Care, Other (non HMO) | Admitting: Internal Medicine

## 2018-12-22 ENCOUNTER — Encounter: Payer: Self-pay | Admitting: Internal Medicine

## 2018-12-22 VITALS — BP 116/70 | HR 95 | Temp 98.5°F | Ht 69.0 in | Wt 210.0 lb

## 2018-12-22 DIAGNOSIS — I951 Orthostatic hypotension: Secondary | ICD-10-CM

## 2018-12-22 DIAGNOSIS — G90A Postural orthostatic tachycardia syndrome (POTS): Secondary | ICD-10-CM

## 2018-12-22 DIAGNOSIS — R000 Tachycardia, unspecified: Secondary | ICD-10-CM | POA: Diagnosis not present

## 2018-12-22 DIAGNOSIS — E559 Vitamin D deficiency, unspecified: Secondary | ICD-10-CM | POA: Diagnosis not present

## 2018-12-22 NOTE — Progress Notes (Signed)
Pre visit review using our clinic review tool, if applicable. No additional management support is needed unless otherwise documented below in the visit note. 

## 2018-12-22 NOTE — Progress Notes (Signed)
Chief Complaint  Patient presents with  . Follow-up   F.u doing well  1. POTS controlled no syncope since 05/2018 visit due for f/u Dr. Caryl Comes informed pt to call to make f/u needs note for work to drink water. She is working out 3x per week x 30 minutes at the gyn  2. Vit D def taking 1000 D3 qd  No other complaints today    Review of Systems  Constitutional: Negative for weight loss.  HENT: Negative for hearing loss.   Eyes: Negative for blurred vision.  Respiratory: Negative for shortness of breath.   Cardiovascular: Negative for chest pain and palpitations.  Gastrointestinal: Negative for abdominal pain.  Musculoskeletal: Negative for falls.  Skin: Negative for rash.  Neurological: Negative for loss of consciousness and headaches.  Psychiatric/Behavioral: Negative for depression and memory loss.   Past Medical History:  Diagnosis Date  . Abdominal pain   . Dysmenorrhea 05/10/2015  . Endometriosis   . Gastritis    egd 04/07/15   . Nausea & vomiting   . Ovarian cyst   . Ovarian cyst rupture   . POTS (postural orthostatic tachycardia syndrome)   . Syncope and collapse    Past Surgical History:  Procedure Laterality Date  . APPENDECTOMY    . ESOPHAGOGASTRODUODENOSCOPY N/A 04/07/2015   Procedure: ESOPHAGOGASTRODUODENOSCOPY (EGD);  Surgeon: Lucilla Lame, MD;  Location: [facility];  Service: Gastroenterology;  Laterality: N/A;  . INTRAUTERINE DEVICE (IUD) INSERTION N/A 05/15/2015   Procedure: INTRAUTERINE DEVICE (IUD) INSERTION;  Surgeon: Brayton Mars, MD;  Location: [facility];  Service: Gynecology;  Laterality: N/A;  . LAPAROSCOPY N/A 05/15/2015   Procedure: with excision and fulgeration of endometriosis;  Surgeon: Brayton Mars, MD;  Location: [facility];  Service: Gynecology;  Laterality: N/A;  with excision and fulgeration of endometriosis  . OVARIAN CYST SURGERY Bilateral   . TONSILLECTOMY AND ADENOIDECTOMY    . TYMPANOSTOMY TUBE PLACEMENT     Family  History  Problem Relation Age of Onset  . Hypertension Mother   . Diabetes Father   . Learning disabilities Father   . Birth defects Maternal Grandmother   . Birth defects Maternal Grandfather   . Birth defects Paternal Grandmother   . Birth defects Paternal Grandfather   . Colon cancer Neg Hx   . Ovarian cancer Neg Hx   . Breast cancer Neg Hx    Social History   Socioeconomic History  . Marital status: Single    Spouse name: Not on file  . Number of children: Not on file  . Years of education: Not on file  . Highest education level: Not on file  Occupational History  . Occupation: [employer]  Social Needs  . Financial resource strain: Not on file  . Food insecurity:    Worry: Not on file    Inability: Not on file  . Transportation needs:    Medical: Not on file    Non-medical: Not on file  Tobacco Use  . Smoking status: Never Smoker  . Smokeless tobacco: Never Used  Substance and Sexual Activity  . Alcohol use: No    Alcohol/week: 0.0 standard drinks  . Drug use: No  . Sexual activity: Yes    Birth control/protection: I.U.D.  Lifestyle  . Physical activity:    Days per week: Not on file    Minutes per session: Not on file  . Stress: Not on file  Relationships  . Social connections:    Talks on phone:  Not on file    Gets together: Not on file    Attends religious service: Not on file    Active member of club or organization: Not on file    Attends meetings of clubs or organizations: Not on file    Relationship status: Not on file  . Intimate partner violence:    Fear of current or ex partner: Not on file    Emotionally abused: Not on file    Physically abused: Not on file    Forced sexual activity: Not on file  Other Topics Concern  . Not on file  Social History Narrative   Works Administrator, sports    Single    Drinks etoh    Not smoker    No guns    Wearing seat belt    Safe in relationship    Current Meds  Medication  Sig  . amitriptyline (ELAVIL) 10 MG tablet Take 10 mg by mouth at bedtime.  . Levonorgestrel (LILETTA, 52 MG,) 19.5 MCG/DAY IUD IUD by Intrauterine route.   No Known Allergies No results found for this or any previous visit (from the past 2160 hour(s)). Objective  Body mass index is 31.01 kg/m. Wt Readings from Last 3 Encounters:  12/22/18 210 lb (95.3 kg)  11/20/18 212 lb 9.6 oz (96.4 kg)  08/12/18 212 lb (96.2 kg)   Temp Readings from Last 3 Encounters:  12/22/18 98.5 F (36.9 C) (Oral)  11/20/18 98.3 F (36.8 C) (Oral)  08/12/18 98.2 F (36.8 C) (Oral)   BP Readings from Last 3 Encounters:  12/22/18 116/70  11/20/18 100/72  08/12/18 116/74   Pulse Readings from Last 3 Encounters:  12/22/18 95  11/20/18 95  08/12/18 95    Physical Exam Vitals signs and nursing note reviewed.  Constitutional:      Appearance: Normal appearance. She is well-developed and well-groomed. She is obese.  HENT:     Head: Normocephalic and atraumatic.     Ears:     Comments: Mild fluid in b/l ears     Nose: Nose normal.     Mouth/Throat:     Mouth: Mucous membranes are moist.     Pharynx: Oropharynx is clear.  Eyes:     Conjunctiva/sclera: Conjunctivae normal.     Pupils: Pupils are equal, round, and reactive to light.  Cardiovascular:     Rate and Rhythm: Normal rate and regular rhythm.     Heart sounds: Normal heart sounds. No murmur.  Pulmonary:     Effort: Pulmonary effort is normal.     Breath sounds: Normal breath sounds. No wheezing.  Skin:    General: Skin is warm and dry.     Comments: Darker nevi left upper back    Neurological:     General: No focal deficit present.     Mental Status: She is alert and oriented to person, place, and time. Mental status is at baseline.     Gait: Gait normal.  Psychiatric:        Attention and Perception: Attention and perception normal.        Mood and Affect: Mood and affect normal.        Speech: Speech normal.        Behavior:  Behavior normal. Behavior is cooperative.        Thought Content: Thought content normal.        Cognition and Memory: Cognition and memory normal.        Judgment: Judgment  normal.     Assessment   1. POTS 2. Vit D def  3. HM Plan   1. Given letter work needs to make appt with Dr. Caryl Comes overdue  2. rec 5000 IU D3 qd  3.  Declines flu shot  Tdap had 06/27/17 GC/C neg 08/21/17 h/o trich and BV in 2018, HIV neg 05/10/15  Pap needs call Dr. Leafy Ro pt to call has IUD  Check fasting labs CMET, CBC, lipid, UA, TSH, T4 vitamin D, A1C, MMR, hep B due 03/20/2019   rec take vitamin D she has Rx not taking Provider: Dr. Olivia Mackie McLean-Scocuzza-[facility]

## 2018-12-22 NOTE — Patient Instructions (Signed)
Call Dr. Dalbert Garnet for pap and call Dr. Graciela Husbands for f/u    Postural Orthostatic Tachycardia Syndrome Postural orthostatic tachycardia syndrome (POTS) is a group of symptoms that occur when a person stands up after lying down. POTS occurs when less blood than normal flows to the body when you stand up. The reduced blood flow to the body makes the heart beat rapidly. POTS may be associated with another medical condition, or it may occur on its own. What are the causes? The cause of this condition is not known, but many conditions and diseases are associated with it. What increases the risk? This condition is more likely to develop in:  Women 13-67 years old.  Women who are pregnant.  Women who are in their period (menstruating).  People who have certain conditions, such as: ? Infection from a virus. ? Attacks of healthy organs by the body's immunity (autoimmune disease). ? Losing a lot of red blood cells (anemia). ? Losing too much water in the body (dehydration). ? An overactive thyroid (hyperthyroidism).  People who take certain medicines.  People who have had a major injury.  People who have had surgery. What are the signs or symptoms? The most common symptom of this condition is light-headedness when one stands from a lying or sitting position. Other symptoms may include:  Feeling a rapid increase in the heartbeat (tachycardia) within 10 minutes of standing up.  Fainting.  Weakness.  Confusion.  Trembling.  Shortness of breath.  Sweating or flushing.  Headache.  Chest pain.  Breathing that is deeper and faster than normal (hyperventilation).  Nausea.  Anxiety. Symptoms may be worse in the morning, and they may be relieved by lying down. How is this diagnosed? This condition is diagnosed based on:  Your symptoms.  Your medical history.  A physical exam.  Checking your heart rate when you are lying down and after you stand up.  Checking your blood  pressure when you go from lying down to standing up.  Blood tests to measure hormones that change with blood pressure. The blood tests will be done when you are lying down and when you are standing up. You may have other tests to check for conditions or diseases that are associated with POTS. How is this treated? Treatment for this condition depends on how severe your symptoms are and whether you have any conditions or diseases that are associated with POTS. Treatment may involve:  Treating any conditions or diseases that are associated with POTS.  Drinking two glasses of water before getting up from a lying position.  Eating more salt (sodium).  Taking medicine to control blood pressure and heart rate (beta-blocker).  Avoiding certain medicines.  Starting an exercise program under the supervision of a health care provider. Follow these instructions at home: Medicines  Take over-the-counter and prescription medicines only as told by your health care provider.  Let your health care provider know about all prescription or over-the-counter medicines. These include herbs, vitamins, and supplements. You may need to stop or adjust some medicines if they cause this condition.  Talk with your health care provider before starting any new medicines. Eating and drinking   Drink enough fluid to keep your urine pale yellow.  If told by your health care provider, drink two glasses of water before getting up from a lying position.  Follow instructions from your health care provider about how much sodium you should eat.  Avoid heavy meals. Eat several small meals a day instead of  a few large meals. General instructions  Do an aerobic exercise for 20 minutes a day, at least 3 days a week.  Ask your health care provider what kinds of exercise are safe for you.  Do not use any products that contain nicotine or tobacco, such as cigarettes and e-cigarettes. These can interfere with blood flow.  If you need help quitting, ask your health care provider.  Keep all follow-up visits as told by your health care provider. This is important. Contact a health care provider if:  Your symptoms do not improve after treatment.  Your symptoms get worse.  You develop new symptoms. Get help right away if:  You have chest pain.  You have difficulty breathing.  You have fainting episodes. These symptoms may represent a serious problem that is an emergency. Do not wait to see if the symptoms will go away. Get medical help right away. Call your local emergency services (911 in the [place] Do not drive yourself to the hospital. Summary  POTS is a condition that can cause light-headedness, fainting, and palpitations when you go from a sitting or lying position to a standing position. It occurs when less blood than normal flows to the body when you stand up.  Treatment for this condition includes treating any underlying conditions, drinking plenty of water, stopping or changing some medicines, or starting an exercise program.  Get help right away if you have chest pain, difficulty breathing, or fainting episodes. These may represent a serious problem that is an emergency. This information is not intended to replace advice given to you by your health care provider. Make sure you discuss any questions you have with your health care provider. Document Released: 11/01/2002 Document Revised: 12/23/2017 Document Reviewed: 12/23/2017 Elsevier Interactive Patient Education  2019 [employer].

## 2019-02-02 ENCOUNTER — Other Ambulatory Visit: Payer: Managed Care, Other (non HMO)

## 2019-02-02 DIAGNOSIS — Z0000 Encounter for general adult medical examination without abnormal findings: Secondary | ICD-10-CM

## 2019-02-03 LAB — CBC WITH DIFFERENTIAL/PLATELET
BASOS: 0 %
Basophils Absolute: 0 10*3/uL (ref 0.0–0.2)
EOS (ABSOLUTE): 0.1 10*3/uL (ref 0.0–0.4)
Eos: 1 %
HEMATOCRIT: 40.8 % (ref 34.0–46.6)
Hemoglobin: 13.4 g/dL (ref 11.1–15.9)
IMMATURE GRANS (ABS): 0.1 10*3/uL (ref 0.0–0.1)
Immature Granulocytes: 1 %
LYMPHS ABS: 2 10*3/uL (ref 0.7–3.1)
Lymphs: 23 %
MCH: 29.2 pg (ref 26.6–33.0)
MCHC: 32.8 g/dL (ref 31.5–35.7)
MCV: 89 fL (ref 79–97)
MONOCYTES: 7 %
Monocytes Absolute: 0.6 10*3/uL (ref 0.1–0.9)
NEUTROS ABS: 5.8 10*3/uL (ref 1.4–7.0)
Neutrophils: 68 %
Platelets: 290 10*3/uL (ref 150–450)
RBC: 4.59 x10E6/uL (ref 3.77–5.28)
RDW: 13.2 % (ref 11.7–15.4)
WBC: 8.6 10*3/uL (ref 3.4–10.8)

## 2019-02-03 LAB — COMPREHENSIVE METABOLIC PANEL
A/G RATIO: 2.3 — AB (ref 1.2–2.2)
ALBUMIN: 4.5 g/dL (ref 3.9–5.0)
ALT: 11 IU/L (ref 0–32)
AST: 18 IU/L (ref 0–40)
Alkaline Phosphatase: 74 IU/L (ref 39–117)
BILIRUBIN TOTAL: 0.7 mg/dL (ref 0.0–1.2)
BUN / CREAT RATIO: 13 (ref 9–23)
BUN: 9 mg/dL (ref 6–20)
CALCIUM: 9.5 mg/dL (ref 8.7–10.2)
CHLORIDE: 100 mmol/L (ref 96–106)
CO2: 22 mmol/L (ref 20–29)
Creatinine, Ser: 0.7 mg/dL (ref 0.57–1.00)
GFR, EST AFRICAN AMERICAN: 141 mL/min/{1.73_m2} (ref >59)
GFR, EST NON AFRICAN AMERICAN: 123 mL/min/{1.73_m2} (ref >59)
Globulin, Total: 2 g/dL (ref 1.5–4.5)
Glucose: 89 mg/dL (ref 65–99)
POTASSIUM: 4.7 mmol/L (ref 3.5–5.2)
Sodium: 140 mmol/L (ref 134–144)
TOTAL PROTEIN: 6.5 g/dL (ref 6.0–8.5)

## 2019-02-03 LAB — VITAMIN D 25 HYDROXY (VIT D DEFICIENCY, FRACTURES): Vit D, 25-Hydroxy: 25.8 ng/mL — ABNORMAL LOW (ref 30.0–100.0)

## 2019-02-03 LAB — LIPID PANEL WITH LDL/HDL RATIO
Cholesterol, Total: 155 mg/dL (ref 100–199)
HDL: 42 mg/dL (ref >39)
LDL CALC: 98 mg/dL (ref 0–99)
LDL/HDL RATIO: 2.3 ratio (ref 0.0–3.2)
Triglycerides: 73 mg/dL (ref 0–149)
VLDL CHOLESTEROL CAL: 15 mg/dL (ref 5–40)

## 2019-02-03 LAB — MEASLES/MUMPS/RUBELLA IMMUNITY
MUMPS ABS, IGG: >300 AU/mL (ref >10.9)
Rubella Antibodies, IGG: 8.4 index (ref >0.99)

## 2019-02-03 LAB — TSH+FREE T4
FREE T4: 1.24 ng/dL (ref 0.82–1.77)
TSH: 2.23 u[IU]/mL (ref 0.450–4.500)

## 2019-02-03 LAB — HGB A1C W/O EAG: Hgb A1c MFr Bld: 5.5 % (ref 4.8–5.6)

## 2019-02-08 ENCOUNTER — Telehealth: Payer: Self-pay | Admitting: Internal Medicine

## 2019-02-08 NOTE — Telephone Encounter (Signed)
Copied from CRM [id number]. Topic: Quick Communication - See Telephone Encounter >> Feb 08, 2019 10:08 AM Jens Som A wrote: CRM for notification. See Telephone encounter for: 02/08/19.  Patient is calling to receive lab results. Please advise. Thank you.

## 2019-03-15 ENCOUNTER — Other Ambulatory Visit: Payer: Self-pay

## 2019-03-15 ENCOUNTER — Other Ambulatory Visit: Payer: Managed Care, Other (non HMO)

## 2019-03-15 DIAGNOSIS — Z0000 Encounter for general adult medical examination without abnormal findings: Secondary | ICD-10-CM | POA: Diagnosis not present

## 2019-03-15 DIAGNOSIS — Z30431 Encounter for routine checking of intrauterine contraceptive device: Secondary | ICD-10-CM | POA: Insufficient documentation

## 2019-03-15 NOTE — Addendum Note (Signed)
Addended by: Merlyn Conley on: 03/15/2019 08:18 AM   Modules accepted: Orders  

## 2019-03-15 NOTE — Addendum Note (Signed)
Addended by: Karen Kays on: 03/15/2019 08:23 AM   Modules accepted: Orders

## 2019-03-15 NOTE — Addendum Note (Signed)
Addended by: Karen Kays on: 03/15/2019 08:18 AM   Modules accepted: Orders

## 2019-03-16 LAB — URINALYSIS, ROUTINE W REFLEX MICROSCOPIC
Bilirubin, UA: NEGATIVE
Glucose, UA: NEGATIVE
Ketones, UA: NEGATIVE
Nitrite, UA: NEGATIVE
Protein,UA: NEGATIVE
Specific Gravity, UA: 1.021 (ref 1.005–1.030)
Urobilinogen, Ur: 0.2 mg/dL (ref 0.2–1.0)
pH, UA: 6 (ref 5.0–7.5)

## 2019-03-16 LAB — MICROSCOPIC EXAMINATION: Casts: NONE SEEN /lpf

## 2019-03-16 LAB — HEPATITIS B SURFACE ANTIBODY, QUANTITATIVE: Hepatitis B Surf Ab Quant: <3.1 m[IU]/mL — ABNORMAL LOW (ref >9.9)

## 2019-03-16 NOTE — Addendum Note (Signed)
Addended by: Karen Kays on: 03/16/2019 03:59 PM   Modules accepted: Level of Service

## 2019-03-17 NOTE — Addendum Note (Signed)
Addended by: Karen Kays on: 03/17/2019 09:31 AM   Modules accepted: Level of Service

## 2019-03-18 NOTE — Addendum Note (Signed)
Addended by: Berniece Pap on: 03/18/2019 12:15 PM   Modules accepted: Level of Service

## 2019-03-18 NOTE — Addendum Note (Signed)
Addended by: Berniece Pap on: 03/18/2019 10:56 AM   Modules accepted: Level of Service

## 2019-09-30 ENCOUNTER — Ambulatory Visit
Admission: RE | Admit: 2019-09-30 | Discharge: 2019-09-30 | Disposition: A | Discharge status: Home / Self Care | Payer: Managed Care, Other (non HMO) | Source: Ambulatory Visit | Attending: Internal Medicine | Admitting: Internal Medicine

## 2019-09-30 ENCOUNTER — Ambulatory Visit: Payer: Managed Care, Other (non HMO) | Admitting: Internal Medicine

## 2019-09-30 ENCOUNTER — Encounter: Payer: Self-pay | Admitting: Internal Medicine

## 2019-09-30 ENCOUNTER — Other Ambulatory Visit: Payer: Self-pay

## 2019-09-30 VITALS — BP 108/60 | HR 77 | Temp 97.8°F | Ht 69.0 in | Wt 220.0 lb

## 2019-09-30 DIAGNOSIS — Z30431 Encounter for routine checking of intrauterine contraceptive device: Secondary | ICD-10-CM

## 2019-09-30 DIAGNOSIS — R200 Anesthesia of skin: Secondary | ICD-10-CM

## 2019-09-30 DIAGNOSIS — M79605 Pain in left leg: Secondary | ICD-10-CM

## 2019-09-30 DIAGNOSIS — R600 Localized edema: Secondary | ICD-10-CM

## 2019-09-30 DIAGNOSIS — R202 Paresthesia of skin: Secondary | ICD-10-CM

## 2019-09-30 DIAGNOSIS — M545 Low back pain, unspecified: Secondary | ICD-10-CM

## 2019-09-30 DIAGNOSIS — Z23 Encounter for immunization: Secondary | ICD-10-CM | POA: Diagnosis not present

## 2019-09-30 DIAGNOSIS — M2550 Pain in unspecified joint: Secondary | ICD-10-CM

## 2019-09-30 DIAGNOSIS — M79604 Pain in right leg: Secondary | ICD-10-CM

## 2019-09-30 DIAGNOSIS — N939 Abnormal uterine and vaginal bleeding, unspecified: Secondary | ICD-10-CM

## 2019-09-30 MED ORDER — PREDNISONE 10 MG PO TABS: 10.0000 mg | ORAL_TABLET | Freq: Every day | ORAL | 0 refills | Status: DC

## 2019-09-30 MED ORDER — KETOROLAC TROMETHAMINE 60 MG/2ML IM SOLN
60.0000 mg | Freq: Once | INTRAMUSCULAR | Status: AC
  Administered 2019-09-30: 14:00:00 30 mg via INTRAMUSCULAR

## 2019-09-30 NOTE — Patient Instructions (Signed)
Please start Prednisone in the am  [facility]  Xray low back today  As needed tylenol or Ibuprofen  Paresthesia Paresthesia is an abnormal burning or prickling sensation. It is usually felt in the hands, arms, legs, or feet. However, it may occur in any part of the body. Usually, paresthesia is not painful. It may feel like:  Tingling or numbness.  Buzzing.  Itching. Paresthesia may occur without any clear cause, or it may be caused by:  Breathing too quickly (hyperventilation).  Pressure on a nerve.  An underlying medical condition.  Side effects of a medication.  Nutritional deficiencies.  Exposure to toxic chemicals. Most people experience temporary (transient) paresthesia at some time in their lives. For some people, it may be long-lasting (chronic) because of an underlying medical condition. If you have paresthesia that lasts a long time, you may need to be evaluated by your health care provider. Follow these instructions at home: Alcohol use   Do not drink alcohol if: ? Your health care provider tells you not to drink. ? You are pregnant, may be pregnant, or are planning to become pregnant.  If you drink alcohol: ? Limit how much you use to:  0-1 drink a day for women.  0-2 drinks a day for men. ? Be aware of how much alcohol is in your drink. In the [place] one drink equals one 12 oz bottle of beer (355 mL), one 5 oz glass of wine (148 mL), or one 1 oz glass of hard liquor (44 mL). Nutrition   Eat a healthy diet. This includes: ? Eating foods that are high in fiber, such as fresh fruits and vegetables, whole grains, and beans. ? Limiting foods that are high in fat and processed sugars, such as fried or sweet foods. General instructions  Take over-the-counter and prescription medicines only as told by your health care provider.  Do not use any products that contain nicotine or tobacco, such as cigarettes and e-cigarettes. These can keep blood  from reaching damaged nerves. If you need help quitting, ask your health care provider.  If you have diabetes, work closely with your health care provider to keep your blood sugar under control.  If you have numbness in your feet: ? Check every day for signs of injury or infection. Watch for redness, warmth, and swelling. ? Wear padded socks and comfortable shoes. These help protect your feet.  Keep all follow-up visits as told by your health care provider. This is important. Contact a health care provider if you:  Have paresthesia that gets worse or does not go away.  Have a burning or prickling feeling that gets worse when you walk.  Have pain, cramps, or dizziness.  Develop a rash. Get help right away if you:  Feel weak.  Have trouble walking or moving.  Have problems with speech, understanding, or vision.  Feel confused.  Cannot control your bladder or bowel movements.  Have numbness after an injury.  Develop new weakness in an arm or leg.  Faint. Summary  Paresthesia is an abnormal burning or prickling sensation that is usually felt in the hands, arms, legs, or feet. It may also occur in other parts of the body.  Paresthesia may occur without any clear cause, or it may be caused by breathing too quickly (hyperventilation), pressure on a nerve, an underlying medical condition, side effects of a medication, nutritional deficiencies, or exposure to toxic chemicals.  If you have paresthesia that lasts a long  time, you may need to be evaluated by your health care provider. This information is not intended to replace advice given to you by your health care provider. Make sure you discuss any questions you have with your health care provider. Document Released: 11/01/2002 Document Revised: 12/07/2018 Document Reviewed: 11/20/2017 Elsevier Patient Education  2020 [employer]  Sciatica  Sciatica is pain, numbness, weakness, or tingling along the path of the sciatic  nerve. The sciatic nerve starts in the lower back and runs down the back of each leg. The nerve controls the muscles in the lower leg and in the back of the knee. It also provides feeling (sensation) to the back of the thigh, the lower leg, and the sole of the foot. Sciatica is a symptom of another medical condition that pinches or puts pressure on the sciatic nerve. Sciatica most often only affects one side of the body. Sciatica usually goes away on its own or with treatment. In some cases, sciatica may come back (recur). What are the causes? This condition is caused by pressure on the sciatic nerve or pinching of the nerve. This may be the result of:  A disk in between the bones of the spine bulging out too far (herniated disk).  Age-related changes in the spinal disks.  A pain disorder that affects a muscle in the buttock.  Extra bone growth near the sciatic nerve.  A break (fracture) of the pelvis.  Pregnancy.  Tumor. This is rare. What increases the risk? The following factors may make you more likely to develop this condition:  Playing sports that place pressure or stress on the spine.  Having poor strength and flexibility.  A history of back injury or surgery.  Sitting for long periods of time.  Doing activities that involve repetitive bending or lifting.  Obesity. What are the signs or symptoms? Symptoms can vary from mild to very severe, and they may include:  Any of these problems in the lower back, leg, hip, or buttock: ? Mild tingling, numbness, or dull aches. ? Burning sensations. ? Sharp pains.  Numbness in the back of the calf or the sole of the foot.  Leg weakness.  Severe back pain that makes movement difficult. Symptoms may get worse when you cough, sneeze, or laugh, or when you sit or stand for long periods of time. How is this diagnosed? This condition may be diagnosed based on:  Your symptoms and medical history.  A physical exam.  Blood  tests.  Imaging tests, such as: ? X-rays. ? MRI. ? CT scan. How is this treated? In many cases, this condition improves on its own without treatment. However, treatment may include:  Reducing or modifying physical activity.  Exercising and stretching.  Icing and applying heat to the affected area.  Medicines that help to: ? Relieve pain and swelling. ? Relax your muscles.  Injections of medicines that help to relieve pain, irritation, and inflammation around the sciatic nerve (steroids).  Surgery. Follow these instructions at home: Medicines  Take over-the-counter and prescription medicines only as told by your health care provider.  Ask your health care provider if the medicine prescribed to you: ? Requires you to avoid driving or using heavy machinery. ? Can cause constipation. You may need to take these actions to prevent or treat constipation:  Drink enough fluid to keep your urine pale yellow.  Take over-the-counter or prescription medicines.  Eat foods that are high in fiber, such as beans, whole grains, and fresh fruits  and vegetables.  Limit foods that are high in fat and processed sugars, such as fried or sweet foods. Managing pain      If directed, put ice on the affected area. ? Put ice in a plastic bag. ? Place a towel between your skin and the bag. ? Leave the ice on for 20 minutes, 2-3 times a day.  If directed, apply heat to the affected area. Use the heat source that your health care provider recommends, such as a moist heat pack or a heating pad. ? Place a towel between your skin and the heat source. ? Leave the heat on for 20-30 minutes. ? Remove the heat if your skin turns bright red. This is especially important if you are unable to feel pain, heat, or cold. You may have a greater risk of getting burned. Activity   Return to your normal activities as told by your health care provider. Ask your health care provider what activities are safe for  you.  Avoid activities that make your symptoms worse.  Take brief periods of rest throughout the day. ? When you rest for longer periods, mix in some mild activity or stretching between periods of rest. This will help to prevent stiffness and pain. ? Avoid sitting for long periods of time without moving. Get up and move around at least one time each hour.  Exercise and stretch regularly, as told by your health care provider.  Do not lift anything that is heavier than 10 lb (4.5 kg) while you have symptoms of sciatica. When you do not have symptoms, you should still avoid heavy lifting, especially repetitive heavy lifting.  When you lift objects, always use proper lifting technique, which includes: ? Bending your knees. ? Keeping the load close to your body. ? Avoiding twisting. General instructions  Maintain a healthy weight. Excess weight puts extra stress on your back.  Wear supportive, comfortable shoes. Avoid wearing high heels.  Avoid sleeping on a mattress that is too soft or too hard. A mattress that is firm enough to support your back when you sleep may help to reduce your pain.  Keep all follow-up visits as told by your health care provider. This is important. Contact a health care provider if:  You have pain that: ? Wakes you up when you are sleeping. ? Gets worse when you lie down. ? Is worse than you have experienced in the past. ? Lasts longer than 4 weeks.  You have an unexplained weight loss. Get help right away if:  You are not able to control when you urinate or have bowel movements (incontinence).  You have: ? Weakness in your lower back, pelvis, buttocks, or legs that gets worse. ? Redness or swelling of your back. ? A burning sensation when you urinate. Summary  Sciatica is pain, numbness, weakness, or tingling along the path of the sciatic nerve.  This condition is caused by pressure on the sciatic nerve or pinching of the nerve.  Sciatica can  cause pain, numbness, or tingling in the lower back, legs, hips, and buttocks.  Treatment often includes rest, exercise, medicines, and applying ice or heat. This information is not intended to replace advice given to you by your health care provider. Make sure you discuss any questions you have with your health care provider. Document Released: 11/05/2001 Document Revised: 11/30/2018 Document Reviewed: 11/30/2018 Elsevier Patient Education  2020 [employer].

## 2019-09-30 NOTE — Progress Notes (Signed)
Chief Complaint  Patient presents with  . Leg Pain    Bilat   F/u  1. 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 She also feels needles/tingling down legs 9/10 leg pain. Denies back pain. Sxs ongoing x 3 weeks and she is even having pain in her calf. Has tried epsolm salt soaks, icy hot and advil as needs Reviewed labs ANA neg 07/04/2015 for autoimmune etiology   2. Need for IUD check wants to f/u with Dr. Leafy Ro and c/o vaginal bleeding  Review of Systems  Respiratory: Negative for shortness of breath.   Cardiovascular: Negative for chest pain.  Musculoskeletal: Positive for joint pain. Negative for back pain.  Neurological: Positive for tingling, sensory change and weakness.   Past Medical History:  Diagnosis Date  . Abdominal pain   . Dysmenorrhea 05/10/2015  . Endometriosis   . Gastritis    egd 04/07/15   . Nausea & vomiting   . Ovarian cyst   . Ovarian cyst rupture   . POTS (postural orthostatic tachycardia syndrome)   . Syncope and collapse   . Vitamin D deficiency    Past Surgical History:  Procedure Laterality Date  . APPENDECTOMY    . ESOPHAGOGASTRODUODENOSCOPY N/A 04/07/2015   Procedure: ESOPHAGOGASTRODUODENOSCOPY (EGD);  Surgeon: Lucilla Lame, MD;  Location: [place];  Service: Gastroenterology;  Laterality: N/A;  . INTRAUTERINE DEVICE (IUD) INSERTION N/A 05/15/2015   Procedure: INTRAUTERINE DEVICE (IUD) INSERTION;  Surgeon: Brayton Mars, MD;  Location: [facility];  Service: Gynecology;  Laterality: N/A;  . LAPAROSCOPY N/A 05/15/2015   Procedure: with excision and fulgeration of endometriosis;  Surgeon: Brayton Mars, MD;  Location: [facility];  Service: Gynecology;  Laterality: N/A;  with excision and fulgeration of endometriosis  . OVARIAN CYST SURGERY Bilateral   . TONSILLECTOMY AND ADENOIDECTOMY    . TYMPANOSTOMY TUBE PLACEMENT      Family History  Problem Relation Age of Onset  . Hypertension Mother   . Diabetes Father   . Learning disabilities Father   . Birth defects Maternal Grandmother   . Birth defects Maternal Grandfather   . Birth defects Paternal Grandmother   . Birth defects Paternal Grandfather   . Colon cancer Neg Hx   . Ovarian cancer Neg Hx   . Breast cancer Neg Hx    Social History   Socioeconomic History  . Marital status: Single    Spouse name: Not on file  . Number of children: Not on file  . Years of education: Not on file  . Highest education level: Not on file  Occupational History  . Occupation: [employer]  Social Needs  . Financial resource strain: Not on file  . Food insecurity    Worry: Not on file    Inability: Not on file  . Transportation needs    Medical: Not on file    Non-medical: Not on file  Tobacco Use  . Smoking status: Never Smoker  . Smokeless tobacco: Never Used  Substance and Sexual Activity  . Alcohol use: No    Alcohol/week: 0.0 standard drinks  . Drug use: No  . Sexual activity: Yes    Birth control/protection: I.U.D.  Lifestyle  . Physical activity    Days per week: Not on file    Minutes per session: Not on file  . Stress: Not on file  Relationships  . Social Herbalist on phone: Not on file    Gets together: Not on file    Attends religious service: Not on file    Active member of club or organization: Not on file    Attends meetings of clubs or organizations: Not on file    Relationship status: Not on file  . Intimate partner violence    Fear of current or ex partner: Not on file    Emotionally abused: Not on file    Physically abused: Not on file    Forced sexual activity: Not on file  Other Topics Concern  . Not on file  Social History Narrative   Works Administrator, sports    Single    Drinks etoh    Not smoker    No guns    Wearing seat belt    Safe in relationship    Current Meds   Medication Sig  . amitriptyline (ELAVIL) 10 MG tablet Take 10 mg by mouth at bedtime.  . Levonorgestrel (LILETTA, 52 MG,) 19.5 MCG/DAY IUD IUD by Intrauterine route.   No Known Allergies Recent Results (from the past 2160 hour(s))  D-dimer, quantitative (not at [facility])     Status: None   Collection Time: 10/01/19  2:05 PM  Result Value Ref Range   D-DIMER 0.37 0.00 - 0.49 mg/L FEU    Comment: According to the assay manufacturer's published package insert, a normal (<0.50 mg/L FEU) D-dimer result in conjunction with a non-high clinical probability assessment, excludes deep vein thrombosis (DVT) and pulmonary embolism (PE) with high sensitivity. D-dimer values increase with age and this can make VTE exclusion of an older population difficult. To address this, the [place], based on best available evidence and recent guidelines, recommends that clinicians use age-adjusted D-dimer thresholds in patients greater than 13 years of age with: a) a low probability of PE who do not meet all Pulmonary Embolism Rule Out Criteria, or b) in those with intermediate probability of PE. The formula for an age-adjusted D-dimer cut-off is "age/100". For example, a 23 year old patient would have an age-adjusted cut-off of 0.60 mg/L FEU and an 23 year old 0.80 mg/L FEU.    Objective  Body mass index is 32.49 kg/m. Wt Readings from Last 3 Encounters:  09/30/19 220 lb (99.8 kg)  12/22/18 210 lb (95.3 kg)  11/20/18 212 lb 9.6 oz (96.4 kg)   Temp Readings from Last 3 Encounters:  09/30/19 97.8 F (36.6 C) (Skin)  12/22/18 98.5 F (36.9 C) (Oral)  11/20/18 98.3 F (36.8 C) (Oral)   BP Readings from Last 3 Encounters:  09/30/19 108/60  12/22/18 116/70  11/20/18 100/72   Pulse Readings from Last 3 Encounters:  09/30/19 77  12/22/18 95  11/20/18 95    Physical Exam Vitals signs and nursing note reviewed.  Constitutional:      Appearance: Normal appearance. She is  well-developed and well-groomed.     Comments: +mask on    HENT:     Head: Normocephalic and atraumatic.  Eyes:     Conjunctiva/sclera: Conjunctivae normal.     Pupils: Pupils are equal, round, and reactive to light.  Cardiovascular:     Rate and Rhythm: Normal rate and regular rhythm.     Heart sounds: Normal heart sounds. No murmur.  Pulmonary:     Effort: Pulmonary effort is normal.     Breath sounds: Normal breath sounds.  Musculoskeletal:  Lumbar back: She exhibits tenderness.     Right lower leg: She exhibits tenderness.     Comments: Pressing on mid lower back causes tingling in legs and feet and pressure but straightening legs helps    Skin:    General: Skin is warm and dry.  Neurological:     General: No focal deficit present.     Mental Status: She is alert and oriented to person, place, and time. Mental status is at baseline.     Gait: Gait normal.  Psychiatric:        Attention and Perception: Attention and perception normal.        Mood and Affect: Mood and affect normal.        Speech: Speech normal.        Behavior: Behavior normal. Behavior is cooperative.        Thought Content: Thought content normal.        Cognition and Memory: Cognition normal.        Judgment: Judgment normal.     Assessment  Plan  Low back pain, unspecified back pain laterality, unspecified chronicity, unspecified whether sciatica present - Plan: DG Lumbar Spine Complete, predniSONE (DELTASONE) 10 MG tablet x 1 week, ketorolac (TORADOL) injection 60 mg x 1   Xray 09/30/2019 negative consider MRI L spine if negative    Polyarthralgia - Plan: Comprehensive metabolic panel, CBC with Differential/Platelet, Urinalysis, Routine w reflex microscopic, CK (Creatine Kinase)  Numbness and tingling - Plan: Comprehensive metabolic panel, CBC with Differential/Platelet, Urinalysis, Routine w reflex microscopic, CK (Creatine Kinase), B12, Rheumatoid Factor, Sedimentation rate, C-reactive  protein, Antinuclear Antib (ANA), CYCLIC CITRUL PEPTIDE ANTIBODY, IGG/IGA  Leg edema - Plan: D-Dimer, Quantitative negative   Pain in both lower extremities - Plan: Comprehensive metabolic panel, CBC with Differential/Platelet, Urinalysis, Routine w reflex microscopic, CK (Creatine Kinase), Rheumatoid Factor, Sedimentation rate, C-reactive protein, Antinuclear Antib (ANA), CYCLIC CITRUL PEPTIDE ANTIBODY, IGG/IGA, D-Dimer, Quantitative, ketorolac (TORADOL) injection 60 mg  IUD check up - Plan: obgyn Vaginal bleeding - Plan: obgyn   HM Flu shot given today  Tdap had8/3/18 GC/C neg 08/21/17 h/o trich and BV in 2018, HIV neg 05/10/15  Pap  --> Dr. Leafy Ro pt to call has IUD and having vag spotting referred today Check fasting labs CMET, CBC, lipid, UA, TSH, T4 vitamin D, A1C, MMR, hep B due 03/20/2019   Provider: Dr. Olivia Mackie McLean-Scocuz[facility]

## 2019-10-01 ENCOUNTER — Other Ambulatory Visit: Payer: Managed Care, Other (non HMO)

## 2019-10-01 ENCOUNTER — Other Ambulatory Visit: Payer: Self-pay | Admitting: Internal Medicine

## 2019-10-01 DIAGNOSIS — R600 Localized edema: Secondary | ICD-10-CM

## 2019-10-01 DIAGNOSIS — R200 Anesthesia of skin: Secondary | ICD-10-CM

## 2019-10-01 DIAGNOSIS — M79604 Pain in right leg: Secondary | ICD-10-CM

## 2019-10-01 DIAGNOSIS — M79605 Pain in left leg: Secondary | ICD-10-CM

## 2019-10-02 LAB — D-DIMER, QUANTITATIVE: D-DIMER: 0.37 mg/L FEU (ref 0.00–0.49)

## 2019-10-04 ENCOUNTER — Telehealth: Payer: Self-pay

## 2019-10-04 NOTE — Telephone Encounter (Signed)
Copied from [place] [id number]. Topic: Appointment Scheduling - Scheduling Inquiry for Clinic >> Oct 04, 2019  2:44 PM Rutherford Nail, [place] wrote: Reason for CRM: Patient calling and states that she has a follow up appointment on 10/19/2019. Would like to know if there is anything available the week of 10/10/2019. States that Monday, Thursday, and Friday works best. Please advise. Left patient on for current appointment, would like a call to reschedule to earlier date.

## 2019-10-06 LAB — URINALYSIS, ROUTINE W REFLEX MICROSCOPIC
Bilirubin, UA: NEGATIVE
Glucose, UA: NEGATIVE
Ketones, UA: NEGATIVE
Nitrite, UA: NEGATIVE
Protein,UA: NEGATIVE
Specific Gravity, UA: 1.019 (ref 1.005–1.030)
Urobilinogen, Ur: 0.2 mg/dL (ref 0.2–1.0)
pH, UA: 7 (ref 5.0–7.5)

## 2019-10-06 LAB — CBC WITH DIFFERENTIAL/PLATELET
Basophils Absolute: 0 10*3/uL (ref 0.0–0.2)
Basos: 0 %
EOS (ABSOLUTE): 0.1 10*3/uL (ref 0.0–0.4)
Eos: 2 %
Hematocrit: 43.6 % (ref 34.0–46.6)
Hemoglobin: 14.1 g/dL (ref 11.1–15.9)
Immature Grans (Abs): 0 10*3/uL (ref 0.0–0.1)
Immature Granulocytes: 0 %
Lymphocytes Absolute: 1.5 10*3/uL (ref 0.7–3.1)
Lymphs: 20 %
MCH: 29.1 pg (ref 26.6–33.0)
MCHC: 32.3 g/dL (ref 31.5–35.7)
MCV: 90 fL (ref 79–97)
Monocytes Absolute: 0.6 10*3/uL (ref 0.1–0.9)
Monocytes: 8 %
Neutrophils Absolute: 5.4 10*3/uL (ref 1.4–7.0)
Neutrophils: 70 %
Platelets: 259 10*3/uL (ref 150–450)
RBC: 4.85 x10E6/uL (ref 3.77–5.28)
RDW: 13.3 % (ref 11.7–15.4)
WBC: 7.7 10*3/uL (ref 3.4–10.8)

## 2019-10-06 LAB — CK ISOENZYMES
CK-BB: 0 %
CK-MB: 0 % (ref 0–3)
CK-MM: 100 % (ref 97–100)
Macro Type 1: 0 %
Macro Type 2: 0 %
Total CK: 54 U/L (ref 32–182)

## 2019-10-06 LAB — COMPREHENSIVE METABOLIC PANEL
ALT: 16 IU/L (ref 0–32)
AST: 20 IU/L (ref 0–40)
Albumin/Globulin Ratio: 1.7 (ref 1.2–2.2)
Albumin: 4.3 g/dL (ref 3.9–5.0)
Alkaline Phosphatase: 67 IU/L (ref 39–117)
BUN/Creatinine Ratio: 17 (ref 9–23)
BUN: 13 mg/dL (ref 6–20)
Bilirubin Total: 0.7 mg/dL (ref 0.0–1.2)
CO2: 24 mmol/L (ref 20–29)
Calcium: 9.4 mg/dL (ref 8.7–10.2)
Chloride: 106 mmol/L (ref 96–106)
Creatinine, Ser: 0.78 mg/dL (ref 0.57–1.00)
GFR calc Af Amer: 124 mL/min/{1.73_m2} (ref >59)
GFR calc non Af Amer: 107 mL/min/{1.73_m2} (ref >59)
Globulin, Total: 2.6 g/dL (ref 1.5–4.5)
Glucose: 94 mg/dL (ref 65–99)
Potassium: 4.6 mmol/L (ref 3.5–5.2)
Sodium: 142 mmol/L (ref 134–144)
Total Protein: 6.9 g/dL (ref 6.0–8.5)

## 2019-10-06 LAB — VITAMIN B12: Vitamin B-12: 394 pg/mL (ref 232–1245)

## 2019-10-06 LAB — MICROSCOPIC EXAMINATION: Casts: NONE SEEN /lpf

## 2019-10-06 LAB — RHEUMATOID FACTOR: Rheumatoid fact SerPl-aCnc: <10 IU/mL (ref 0.0–13.9)

## 2019-10-06 LAB — D-DIMER, QUANTITATIVE

## 2019-10-06 LAB — ANA: Anti Nuclear Antibody (ANA): NEGATIVE

## 2019-10-06 LAB — C-REACTIVE PROTEIN: CRP: 11 mg/L — ABNORMAL HIGH (ref 0–10)

## 2019-10-06 LAB — SEDIMENTATION RATE: Sed Rate: 50 mm/hr — ABNORMAL HIGH (ref 0–32)

## 2019-10-06 LAB — CYCLIC CITRUL PEPTIDE ANTIBODY, IGG/IGA: Cyclic Citrullin Peptide Ab: 5 units (ref 0–19)

## 2019-10-17 ENCOUNTER — Telehealth: Payer: Self-pay | Admitting: Internal Medicine

## 2019-10-17 ENCOUNTER — Other Ambulatory Visit: Payer: Self-pay | Admitting: Internal Medicine

## 2019-10-17 DIAGNOSIS — M79604 Pain in right leg: Secondary | ICD-10-CM

## 2019-10-17 DIAGNOSIS — R200 Anesthesia of skin: Secondary | ICD-10-CM

## 2019-10-17 DIAGNOSIS — M79605 Pain in left leg: Secondary | ICD-10-CM

## 2019-10-17 DIAGNOSIS — R29898 Other symptoms and signs involving the musculoskeletal system: Secondary | ICD-10-CM

## 2019-10-17 DIAGNOSIS — M5416 Radiculopathy, lumbar region: Secondary | ICD-10-CM

## 2019-10-17 NOTE — Telephone Encounter (Signed)
MRI ordered but can not be scheduled until 10/25/2019 if exposed to covid 19  Has she been exposed ?   Upcoming appt will need to be virtual or telephone   [place]

## 2019-10-18 NOTE — Telephone Encounter (Signed)
mychart message has been sent

## 2019-10-19 ENCOUNTER — Other Ambulatory Visit: Payer: Self-pay

## 2019-10-19 ENCOUNTER — Encounter: Payer: Self-pay | Admitting: Internal Medicine

## 2019-10-19 ENCOUNTER — Ambulatory Visit (INDEPENDENT_AMBULATORY_CARE_PROVIDER_SITE_OTHER): Payer: Managed Care, Other (non HMO) | Admitting: Internal Medicine

## 2019-10-19 VITALS — Ht 69.0 in | Wt 220.0 lb

## 2019-10-19 DIAGNOSIS — M79604 Pain in right leg: Secondary | ICD-10-CM

## 2019-10-19 DIAGNOSIS — M5442 Lumbago with sciatica, left side: Secondary | ICD-10-CM

## 2019-10-19 DIAGNOSIS — M79605 Pain in left leg: Secondary | ICD-10-CM

## 2019-10-19 DIAGNOSIS — R29898 Other symptoms and signs involving the musculoskeletal system: Secondary | ICD-10-CM

## 2019-10-19 DIAGNOSIS — M5441 Lumbago with sciatica, right side: Secondary | ICD-10-CM

## 2019-10-19 MED ORDER — MELOXICAM 7.5 MG PO TABS: 7.5000 mg | ORAL_TABLET | Freq: Two times a day (BID) | ORAL | 2 refills | Status: DC | PRN

## 2019-10-19 MED ORDER — TRAMADOL HCL 50 MG PO TABS: 50.0000 mg | ORAL_TABLET | Freq: Two times a day (BID) | ORAL | 0 refills | Status: AC | PRN

## 2019-10-19 MED ORDER — ACETAMINOPHEN 500 MG PO TABS: 500.0000 mg | ORAL_TABLET | Freq: Three times a day (TID) | ORAL | 3 refills | Status: DC | PRN

## 2019-10-19 NOTE — Progress Notes (Addendum)
Virtual Visit via Video Note  I connected with Leah Hartman   on 10/19/19 at  1:00 PM EST by a video enabled telemedicine application and verified that I am speaking with the correct person using two identifiers.  Location patient: car Location provider:work or home office Persons participating in the virtual visit: patient, provider  I discussed the limitations of evaluation and management by telemedicine and the availability of in person appointments. The patient expressed understanding and agreed to proceed.   HPI: 1. Low back pain rad to b/l legs with leg weakness 8/10 Sunday am sx's worse and had to crawl to the bathroom reviewed labs ESR and CRP elevated 50 and 11 denies open skin wounds, no h/o ivdu or injury nothing tried   ROS: See pertinent positives and negatives per HPI.  Past Medical History:  Diagnosis Date  . Abdominal pain   . Dysmenorrhea 05/10/2015  . Endometriosis   . Gastritis    egd 04/07/15   . Nausea & vomiting   . Ovarian cyst   . Ovarian cyst rupture   . POTS (postural orthostatic tachycardia syndrome)   . Syncope and collapse   . Vitamin D deficiency     Past Surgical History:  Procedure Laterality Date  . APPENDECTOMY    . ESOPHAGOGASTRODUODENOSCOPY N/A 04/07/2015   Procedure: ESOPHAGOGASTRODUODENOSCOPY (EGD);  Surgeon: Lucilla Lame, MD;  Location: [place];  Service: Gastroenterology;  Laterality: N/A;  . INTRAUTERINE DEVICE (IUD) INSERTION N/A 05/15/2015   Procedure: INTRAUTERINE DEVICE (IUD) INSERTION;  Surgeon: Brayton Mars, MD;  Location: [facility];  Service: Gynecology;  Laterality: N/A;  . LAPAROSCOPY N/A 05/15/2015   Procedure: with excision and fulgeration of endometriosis;  Surgeon: Brayton Mars, MD;  Location: [facility];  Service: Gynecology;  Laterality: N/A;  with excision and fulgeration of endometriosis  . OVARIAN CYST SURGERY Bilateral   . TONSILLECTOMY AND ADENOIDECTOMY    . TYMPANOSTOMY TUBE PLACEMENT      Family  History  Problem Relation Age of Onset  . Hypertension Mother   . Diabetes Father   . Learning disabilities Father   . Birth defects Maternal Grandmother   . Birth defects Maternal Grandfather   . Birth defects Paternal Grandmother   . Birth defects Paternal Grandfather   . Colon cancer Neg Hx   . Ovarian cancer Neg Hx   . Breast cancer Neg Hx     SOCIAL HX:  Works Secondary school teacher  Drinks etoh  Not smoker  No guns  Wearing seat belt  Safe in relationship   Current Outpatient Medications:  .  amitriptyline (ELAVIL) 10 MG tablet, Take 10 mg by mouth at bedtime., Disp: , Rfl:  .  Levonorgestrel (LILETTA, 52 MG,) 19.5 MCG/DAY IUD IUD, by Intrauterine route., Disp: , Rfl:  .  acetaminophen (TYLENOL) 500 MG tablet, Take 1-2 tablets (500-1,000 mg total) by mouth every 8 (eight) hours as needed., Disp: 90 tablet, Rfl: 3 .  meloxicam (MOBIC) 7.5 MG tablet, Take 1 tablet (7.5 mg total) by mouth 2 (two) times daily as needed for pain., Disp: 60 tablet, Rfl: 2 .  predniSONE (DELTASONE) 10 MG tablet, Take 1 tablet (10 mg total) by mouth daily with breakfast. With food (Patient not taking: Reported on 10/19/2019), Disp: 7 tablet, Rfl: 0 .  traMADol (ULTRAM) 50 MG tablet, Take 1 tablet (50 mg total) by mouth every 12 (twelve) hours as needed for up to 5 days. Try to take at night may make you sleepy, Disp: 10  tablet, Rfl: 0  EXAM:  VITALS per patient if applicable:  GENERAL: alert, oriented, appears well and in no acute distress  HEENT: atraumatic, conjunttiva clear, no obvious abnormalities on inspection of external nose and ears  NECK: normal movements of the head and neck  LUNGS: on inspection no signs of respiratory distress, breathing rate appears normal, no obvious gross SOB, gasping or wheezing  CV: no obvious cyanosis  MS: moves all visible extremities without noticeable abnormality  PSYCH/NEURO: pleasant and cooperative, no obvious depression or anxiety, speech and thought  processing grossly intact  ASSESSMENT AND PLAN:  Discussed the following assessment and plan:  Midline low back pain with bilateral sciatica, unspecified chronicity - Plan: meloxicam (MOBIC) 7.5 MG tablet, traMADol (ULTRAM) 50 MG tablet, acetaminophen (TYLENOL) 500 MG tablet Try to get MRI low back sch Xray negative  Consider PT in future and PM&R vs NS  Consider back brace in future   Weakness of both lower extremities - Plan: meloxicam (MOBIC) 7.5 MG tablet, traMADol (ULTRAM) 50 MG tablet, acetaminophen (TYLENOL) 500 MG tablet  Pain in both lower extremities - Plan: meloxicam (MOBIC) 7.5 MG tablet, traMADol (ULTRAM) 50 MG tablet, acetaminophen (TYLENOL) 500 MG tablet HM Flu shot utd  Tdap had8/3/18 GC/C neg 9/27/18h/o trich and BV in 2018, HIV neg 05/10/15  Pap -->Dr. Nani Gasser to callhas IUDand having vag spotting referred today Check fasting labs CMET, CBC, lipid, UA, TSH, T4 vitamin D, A1C, MMR, hep B due 03/20/2019  11/04/19 Dr. Leafy Ro IUD check, chronic pelvic pain improved h/o endometriosis f/u chronic pelvic pain clinic   -we discussed possible serious and likely etiologies, options for evaluation and workup, limitations of telemedicine visit vs in person visit, treatment, treatment risks and precautions. Pt prefers to treat via telemedicine empirically rather then risking or undertaking an in person visit at this moment. Patient agrees to seek prompt in person care if worsening, new symptoms arise, or if is not improving with treatment.   I discussed the assessment and treatment plan with the patient. The patient was provided an opportunity to ask questions and all were answered. The patient agreed with the plan and demonstrated an understanding of the instructions.   The patient was advised to call back or seek an in-person evaluation if the symptoms worsen or if the condition fails to improve as anticipated.  Time spent  Delorise Jackson, MD

## 2019-10-28 ENCOUNTER — Ambulatory Visit
Admission: RE | Admit: 2019-10-28 | Discharge: 2019-10-28 | Disposition: A | Discharge status: Home / Self Care | Payer: Managed Care, Other (non HMO) | Source: Ambulatory Visit | Attending: Internal Medicine | Admitting: Internal Medicine

## 2019-10-28 DIAGNOSIS — M5416 Radiculopathy, lumbar region: Secondary | ICD-10-CM | POA: Diagnosis present

## 2019-10-28 DIAGNOSIS — R200 Anesthesia of skin: Secondary | ICD-10-CM | POA: Diagnosis present

## 2019-10-28 DIAGNOSIS — R202 Paresthesia of skin: Secondary | ICD-10-CM | POA: Diagnosis present

## 2019-10-28 DIAGNOSIS — M79604 Pain in right leg: Secondary | ICD-10-CM | POA: Insufficient documentation

## 2019-10-28 DIAGNOSIS — M79605 Pain in left leg: Secondary | ICD-10-CM | POA: Diagnosis present

## 2019-10-28 DIAGNOSIS — R29898 Other symptoms and signs involving the musculoskeletal system: Secondary | ICD-10-CM | POA: Insufficient documentation

## 2019-10-29 ENCOUNTER — Telehealth: Payer: Self-pay | Admitting: *Deleted

## 2019-10-29 NOTE — Telephone Encounter (Signed)
Copied from [place] [id number]. Topic: General - Inquiry >> Oct 29, 2019  9:36 AM Richardo Priest, NT wrote: Reason for CRM: Pt called in stating she would like a nurse to go over the mri results with her. Please advise.

## 2019-10-29 NOTE — Telephone Encounter (Signed)
Please see result note. Already discussed.

## 2019-11-01 ENCOUNTER — Other Ambulatory Visit: Payer: Self-pay | Admitting: Internal Medicine

## 2019-11-01 ENCOUNTER — Encounter: Payer: Self-pay | Admitting: Internal Medicine

## 2019-11-01 DIAGNOSIS — R937 Abnormal findings on diagnostic imaging of other parts of musculoskeletal system: Secondary | ICD-10-CM

## 2019-11-01 DIAGNOSIS — M5416 Radiculopathy, lumbar region: Secondary | ICD-10-CM

## 2019-11-02 ENCOUNTER — Encounter: Payer: Self-pay | Admitting: Internal Medicine

## 2019-11-02 ENCOUNTER — Telehealth: Payer: Self-pay | Admitting: *Deleted

## 2019-11-02 NOTE — Telephone Encounter (Signed)
Copied from [place] [id number]. Topic: General - Other >> Nov 01, 2019  5:44 PM Erick Blinks wrote: Reason for CRM: Pt is requesting doctor's note for tomorrow, back pain

## 2019-11-09 ENCOUNTER — Other Ambulatory Visit: Payer: Self-pay | Admitting: Internal Medicine

## 2019-11-09 ENCOUNTER — Telehealth: Payer: Self-pay

## 2019-11-09 ENCOUNTER — Encounter: Payer: Self-pay | Admitting: Internal Medicine

## 2019-11-09 DIAGNOSIS — M545 Low back pain, unspecified: Secondary | ICD-10-CM

## 2019-11-09 MED ORDER — TRAMADOL HCL 50 MG PO TABS: 50.0000 mg | ORAL_TABLET | Freq: Two times a day (BID) | ORAL | 0 refills | Status: AC | PRN

## 2019-11-09 MED ORDER — PREDNISONE 10 MG PO TABS: 10.0000 mg | ORAL_TABLET | Freq: Every day | ORAL | 0 refills | Status: DC

## 2019-11-09 NOTE — Telephone Encounter (Signed)
[facility] should give her one she needs to ask them   [place]

## 2019-11-09 NOTE — Telephone Encounter (Signed)
Pt called and wanted to speak with Melissa about her referral. Will you please give pt a call back asap?   CB [phone]

## 2019-11-09 NOTE — Telephone Encounter (Signed)
Copied from [place] [id number]. Topic: General - Inquiry >> Nov 09, 2019  8:05 AM Richardo Priest, NT wrote: Reason for CRM: Pt called in stating she is having extreme back pain as she pulled her back. Pt is wanting to speak with nurse. Please advise.

## 2019-11-09 NOTE — Telephone Encounter (Signed)
Patient would like a work note for today and tomorrow just in case [facility] doesn't give one for her back pain. She bent over this morning to pick something up, her back has been locked up since. Patient stated she has hx of arthritis and bulging discs. Instructed patient to go to [facility] today due to [place] not having any appointments available.

## 2019-11-10 ENCOUNTER — Encounter: Payer: Self-pay | Admitting: Internal Medicine

## 2019-11-11 ENCOUNTER — Other Ambulatory Visit: Payer: Self-pay | Admitting: Internal Medicine

## 2019-11-11 ENCOUNTER — Encounter: Payer: Self-pay | Admitting: Internal Medicine

## 2019-11-11 DIAGNOSIS — M5416 Radiculopathy, lumbar region: Secondary | ICD-10-CM

## 2019-11-11 DIAGNOSIS — R937 Abnormal findings on diagnostic imaging of other parts of musculoskeletal system: Secondary | ICD-10-CM

## 2019-11-12 ENCOUNTER — Encounter: Payer: Self-pay | Admitting: Internal Medicine

## 2019-12-06 ENCOUNTER — Telehealth: Payer: Self-pay | Admitting: Internal Medicine

## 2019-12-06 NOTE — Telephone Encounter (Signed)
I called pt twice and left a vm to call ofc. °

## 2019-12-08 ENCOUNTER — Ambulatory Visit: Payer: Managed Care, Other (non HMO) | Admitting: Internal Medicine

## 2019-12-15 ENCOUNTER — Encounter: Payer: Self-pay | Admitting: Internal Medicine

## 2019-12-15 ENCOUNTER — Ambulatory Visit
Admission: EM | Admit: 2019-12-15 | Discharge: 2019-12-15 | Disposition: A | Discharge status: Home / Self Care | Payer: 59 | Attending: Family Medicine | Admitting: Family Medicine

## 2019-12-15 ENCOUNTER — Other Ambulatory Visit: Payer: Self-pay

## 2019-12-15 DIAGNOSIS — R197 Diarrhea, unspecified: Secondary | ICD-10-CM

## 2019-12-15 DIAGNOSIS — R0602 Shortness of breath: Secondary | ICD-10-CM | POA: Diagnosis not present

## 2019-12-15 DIAGNOSIS — R5383 Other fatigue: Secondary | ICD-10-CM | POA: Diagnosis not present

## 2019-12-15 DIAGNOSIS — J029 Acute pharyngitis, unspecified: Secondary | ICD-10-CM

## 2019-12-15 DIAGNOSIS — M545 Low back pain: Secondary | ICD-10-CM

## 2019-12-15 DIAGNOSIS — Z20822 Contact with and (suspected) exposure to covid-19: Secondary | ICD-10-CM | POA: Diagnosis not present

## 2019-12-15 DIAGNOSIS — M791 Myalgia, unspecified site: Secondary | ICD-10-CM

## 2019-12-15 DIAGNOSIS — R519 Headache, unspecified: Secondary | ICD-10-CM

## 2019-12-15 DIAGNOSIS — R112 Nausea with vomiting, unspecified: Secondary | ICD-10-CM

## 2019-12-15 LAB — POC SARS CORONAVIRUS 2 AG -  ED: SARS Coronavirus 2 Ag: NEGATIVE

## 2019-12-15 MED ORDER — ONDANSETRON HCL 4 MG PO TABS: 4.0000 mg | ORAL_TABLET | Freq: Four times a day (QID) | ORAL | 0 refills | Status: DC

## 2019-12-15 NOTE — Discharge Instructions (Addendum)
Your rapid COVID test was negative. Your send out COVID test is pending. You need to self quarantine until the results come back negative. Check Mychart for results.   May take Tylenol as needed for fever, aches, headaches. Be sure to stay hydrated. Drink 8-10 glasses of fluids a day.   Go to the Emergency Department if having high fever, extreme diarrhea, shortness of breath or other concerns.   Take Zofran 4mg  every 8 hours as needed for nausea.

## 2019-12-15 NOTE — ED Triage Notes (Signed)
Patient states that she has had exposure to Covid, states that her co-worker is positive. Patient states that she started having symptoms on Monday this week. Reports that she is having Nausea, vomiting, Diarrhea, headaches, fatigue since Monday.

## 2019-12-15 NOTE — ED Provider Notes (Signed)
Roderic Palau    CSN: [id number] Arrival date & time: 12/15/19  1519      History   Chief Complaint Chief Complaint  Patient presents with  . Headache    HPI Leah Hartman is a 24 y.o. female.   Reports exposure to COVID at work, reports that it has been going around at work. She reports TMax of 99.1 today, nausea, vomiting, diarrhea, headaches, fatigue, body aches, chills, and sore throat. Reports that she has sense of smell and taste, denies cough. Has not tried to treat at home. Reports that she has still been voiding and drinking fluids.   The history is provided by the patient.  Headache Associated symptoms: abdominal pain, back pain, diarrhea, fatigue, myalgias, nausea, sore throat and vomiting   Associated symptoms: no cough, no ear pain, no eye pain and no fever     Past Medical History:  Diagnosis Date  . Abdominal pain   . Dysmenorrhea 05/10/2015  . Endometriosis   . Gastritis    egd 04/07/15   . Nausea & vomiting   . Ovarian cyst   . Ovarian cyst rupture   . POTS (postural orthostatic tachycardia syndrome)   . Syncope and collapse   . Vitamin D deficiency     Patient Active Problem List   Diagnosis Date Noted  . IUD check up 03/15/2019  . Vitamin D deficiency 12/22/2018  . Sinusitis, acute frontal 11/21/2018  . Interstitial cystitis 06/18/2018  . ADHD 03/19/2018  . Mood swings 03/19/2018  . Dyslexia 03/19/2018  . POTS (postural orthostatic tachycardia syndrome) 03/19/2018  . Viral illness 01/02/2018  . Endometriosis 07/19/2015  . Personal history of perinatal problems 07/04/2015  . Chronic diarrhea 05/26/2015  . Chronic pelvic pain in female 05/10/2015  . Family history of endometriosis in first degree relative 05/10/2015  . Dysmenorrhea 05/10/2015  . Menorrhagia 05/10/2015  . Syncope 01/31/2015  . Chronic fatigue 01/31/2015  . SOB (shortness of breath) 09/27/2014  . PSVT (paroxysmal supraventricular tachycardia) ([place]) 09/27/2014     Past Surgical History:  Procedure Laterality Date  . APPENDECTOMY    . ESOPHAGOGASTRODUODENOSCOPY N/A 04/07/2015   Procedure: ESOPHAGOGASTRODUODENOSCOPY (EGD);  Surgeon: Lucilla Lame, MD;  Location: [facility];  Service: Gastroenterology;  Laterality: N/A;  . INTRAUTERINE DEVICE (IUD) INSERTION N/A 05/15/2015   Procedure: INTRAUTERINE DEVICE (IUD) INSERTION;  Surgeon: Brayton Mars, MD;  Location: [facility];  Service: Gynecology;  Laterality: N/A;  . LAPAROSCOPY N/A 05/15/2015   Procedure: with excision and fulgeration of endometriosis;  Surgeon: Brayton Mars, MD;  Location: [facility];  Service: Gynecology;  Laterality: N/A;  with excision and fulgeration of endometriosis  . OVARIAN CYST SURGERY Bilateral   . TONSILLECTOMY AND ADENOIDECTOMY    . TYMPANOSTOMY TUBE PLACEMENT      OB History   No obstetric history on file.      Home Medications    Prior to Admission medications   Medication Sig Start Date End Date Taking? Authorizing Provider  acetaminophen (TYLENOL) 500 MG tablet Take 1-2 tablets (500-1,000 mg total) by mouth every 8 (eight) hours as needed. 10/19/19  Yes McLean-Scocuzza, Nino Glow, MD  amitriptyline (ELAVIL) 10 MG tablet Take 10 mg by mouth at bedtime.   Yes [provider]  Levonorgestrel (LILETTA, 52 MG,) 19.5 MCG/DAY IUD IUD by Intrauterine route.   Yes [provider]  meloxicam (MOBIC) 7.5 MG tablet Take 1 tablet (7.5 mg total) by mouth 2 (two) times daily as needed for pain.  10/19/19  Yes McLean-Scocuzza, Pasty Spillers, MD  ondansetron (ZOFRAN) 4 MG tablet Take 1 tablet (4 mg total) by mouth every 6 (six) hours. 12/15/19   Moshe Cipro, NP  predniSONE (DELTASONE) 10 MG tablet Take 1 tablet (10 mg total) by mouth daily with breakfast. With food 11/09/19   McLean-Scocuzza, Pasty Spillers, MD    Family History Family History  Problem Relation Age of Onset  . Hypertension Mother   . Diabetes Father   . Learning disabilities  Father   . Birth defects Maternal Grandmother   . Birth defects Maternal Grandfather   . Birth defects Paternal Grandmother   . Birth defects Paternal Grandfather   . Colon cancer Neg Hx   . Ovarian cancer Neg Hx   . Breast cancer Neg Hx     Social History Social History   Tobacco Use  . Smoking status: Never Smoker  . Smokeless tobacco: Never Used  Substance Use Topics  . Alcohol use: No    Alcohol/week: 0.0 standard drinks  . Drug use: No     Allergies   Patient has no known allergies.   Review of Systems Review of Systems  Constitutional: Positive for activity change, appetite change, chills, diaphoresis and fatigue. Negative for fever.  HENT: Positive for sore throat. Negative for ear pain.   Eyes: Negative for pain and visual disturbance.  Respiratory: Positive for shortness of breath. Negative for cough.   Cardiovascular: Negative for chest pain, palpitations and leg swelling.  Gastrointestinal: Positive for abdominal pain, diarrhea, nausea and vomiting.  Genitourinary: Negative for dysuria and hematuria.  Musculoskeletal: Positive for back pain and myalgias.  Skin: Negative for color change and rash.  Neurological: Positive for headaches. Negative for syncope.  All other systems reviewed and are negative.    Physical Exam Triage Vital Signs ED Triage Vitals  Enc Vitals Group     BP      Pulse      Resp      Temp      Temp src      SpO2      Weight      Height      Head Circumference      Peak Flow      Pain Score      Pain Loc      Pain Edu?      Excl. in GC?    No data found.  Updated Vital Signs BP 120/72 (BP Location: Left Arm)   Pulse (!) 114   Temp 99.1 F (37.3 C) (Oral)   Resp 18   Ht 5\' 9"  (1.753 m)   Wt 218 lb (98.9 kg)   SpO2 97%   BMI 32.19 kg/m      Physical Exam Vitals and nursing note reviewed.  Constitutional:      General: She is not in acute distress.    Appearance: She is well-developed. She is ill-appearing.   HENT:     Head: Normocephalic and atraumatic.     Mouth/Throat:     Mouth: Mucous membranes are moist.  Eyes:     Extraocular Movements: Extraocular movements intact.     Conjunctiva/sclera: Conjunctivae normal.     Pupils: Pupils are equal, round, and reactive to light.  Cardiovascular:     Rate and Rhythm: Regular rhythm. Tachycardia present.     Heart sounds: No murmur.  Pulmonary:     Effort: Pulmonary effort is normal. No respiratory distress.     Breath sounds: Normal  breath sounds. No wheezing, rhonchi or rales.  Abdominal:     Palpations: Abdomen is soft.     Tenderness: There is no abdominal tenderness.  Musculoskeletal:        General: No swelling or tenderness.     Cervical back: Neck supple.  Skin:    General: Skin is warm and dry.  Neurological:     Mental Status: She is alert.  Psychiatric:        Mood and Affect: Mood normal.        Behavior: Behavior normal.      UC Treatments / Results  Labs (all labs ordered are listed, but only abnormal results are displayed) Labs Reviewed  NOVEL CORONAVIRUS, NAA  POC SARS CORONAVIRUS 2 AG -  ED    EKG   Radiology No results found.  Procedures Procedures (including critical care time)  Medications Ordered in UC Medications - No data to display  Initial Impression / Assessment and Plan / UC Course  I have reviewed the triage vital signs and the nursing notes.  Pertinent labs & imaging results that were available during my care of the patient were reviewed by me and considered in my medical decision making (see chart for details).    Exposure to COVID and is symptomatic. Instructions given to quarantine until test results are negative. Instructions given on when to report to the emergency department. Follow up with primary care provider as needed. Instructions given on how to take Zofran.  Final Clinical Impressions(s) / UC Diagnoses   Final diagnoses:  Exposure to COVID-19 virus     Discharge  Instructions     Your rapid COVID test was negative. Your send out COVID test is pending. You need to self quarantine until the results come back negative. Check Mychart for results.   May take Tylenol as needed for fever, aches, headaches. Be sure to stay hydrated. Drink 8-10 glasses of fluids a day.   Go to the Emergency Department if having high fever, extreme diarrhea, shortness of breath or other concerns.   Take Zofran 4mg  every 8 hours as needed for nausea.     ED Prescriptions    Medication Sig Dispense Auth. Provider   ondansetron (ZOFRAN) 4 MG tablet Take 1 tablet (4 mg total) by mouth every 6 (six) hours. 12 tablet , NP     PDMP not reviewed this encounter.   Moshe Cipro, NP 12/15/19 1627

## 2019-12-16 ENCOUNTER — Encounter: Payer: Self-pay | Admitting: Internal Medicine

## 2019-12-16 LAB — NOVEL CORONAVIRUS, NAA: SARS-CoV-2, NAA: NOT DETECTED

## 2019-12-21 ENCOUNTER — Encounter: Payer: Self-pay | Admitting: Internal Medicine

## 2019-12-21 ENCOUNTER — Ambulatory Visit (INDEPENDENT_AMBULATORY_CARE_PROVIDER_SITE_OTHER): Payer: 59 | Admitting: Internal Medicine

## 2019-12-21 ENCOUNTER — Other Ambulatory Visit: Payer: Self-pay

## 2019-12-21 VITALS — Ht 69.0 in | Wt 219.0 lb

## 2019-12-21 DIAGNOSIS — R937 Abnormal findings on diagnostic imaging of other parts of musculoskeletal system: Secondary | ICD-10-CM | POA: Diagnosis not present

## 2019-12-21 DIAGNOSIS — E669 Obesity, unspecified: Secondary | ICD-10-CM | POA: Diagnosis not present

## 2019-12-21 DIAGNOSIS — M5416 Radiculopathy, lumbar region: Secondary | ICD-10-CM | POA: Diagnosis not present

## 2019-12-21 NOTE — Progress Notes (Signed)
Virtual Visit via Video Note  I connected with Leah Hartman   on 12/21/19 at  3:45 PM EST by a video enabled telemedicine application and verified that I am speaking with the correct person using two identifiers.  Location patient: car Location provider:work or home office Persons participating in the virtual visit: patient, provider  I discussed the limitations of evaluation and management by telemedicine and the availability of in person appointments. The patient expressed understanding and agreed to proceed.   HPI: 1. Low back pain radiating to legs last saw PM&R 11/10/2019 and upcoming appt 12/27/19 has tried 2 courses of prednisone, tramadol 50 mg bid, mobic 7.5 mg bid, robaxin 500 mg qhs on weekends they rec chiropractor which she has not seen due to insurance changing. She also uses TENS machine 10 min. Bid which helps she is having numbness in her legs and leg pain and weakness as well. She has done 1/2 of her PT sessions which has somewhat helped. She has been doing back exercises at home  10/28/2019 MRI low back  Disc levels:  T12-L1:  No significant canal or neural foraminal narrowing.  L1-L2:   No significant canal or neural foraminal narrowing.  L2-L3: There is a minimal broad-based disc bulge, however no significant canal or neural foraminal narrowing.  L3-L4: There is a minimal broad-based disc bulge, which causes mild bilateral neural foraminal narrowing. No central canal stenosis.  L4-L5: There is a minimal broad-based disc bulge, however no significant canal or neural foraminal narrowing.  L5-S1:   No significant canal or neural foraminal narrowing.  IMPRESSION: Minimal lumbar spine spondylosis in the mid to lower lumbar spine, most notable at L3-L4 with mild bilateral neural foraminal narrowing.  2. Weight gain up to 219 she has started doing slim fast 1x per day in the am with almond mild and more fruits and veggies and water w/o change unable to  exercise as much due to #1  3. covid exposure went yesterday 12/20/19 to take aunt and uncle items +covid 19 at their home but she had her mask on recently 11/2019 tested negative x 2   ROS: See pertinent positives and negatives per HPI.  Past Medical History:  Diagnosis Date  . Abdominal pain   . Dysmenorrhea 05/10/2015  . Endometriosis   . Gastritis    egd 04/07/15   . Nausea & vomiting   . Ovarian cyst   . Ovarian cyst rupture   . POTS (postural orthostatic tachycardia syndrome)   . Syncope and collapse   . Vitamin D deficiency     Past Surgical History:  Procedure Laterality Date  . APPENDECTOMY    . ESOPHAGOGASTRODUODENOSCOPY N/A 04/07/2015   Procedure: ESOPHAGOGASTRODUODENOSCOPY (EGD);  Surgeon: Midge Minium, MD;  Location: [facility];  Service: Gastroenterology;  Laterality: N/A;  . INTRAUTERINE DEVICE (IUD) INSERTION N/A 05/15/2015   Procedure: INTRAUTERINE DEVICE (IUD) INSERTION;  Surgeon: Herold Harms, MD;  Location: [facility];  Service: Gynecology;  Laterality: N/A;  . LAPAROSCOPY N/A 05/15/2015   Procedure: with excision and fulgeration of endometriosis;  Surgeon: Herold Harms, MD;  Location: [facility];  Service: Gynecology;  Laterality: N/A;  with excision and fulgeration of endometriosis  . OVARIAN CYST SURGERY Bilateral   . TONSILLECTOMY AND ADENOIDECTOMY    . TYMPANOSTOMY TUBE PLACEMENT      Family History  Problem Relation Age of Onset  . Hypertension Mother   . Diabetes Father   . Learning disabilities Father   . Birth defects  Maternal Grandmother   . Birth defects Maternal Grandfather   . Birth defects Paternal Grandmother   . Birth defects Paternal Grandfather   . Colon cancer Neg Hx   . Ovarian cancer Neg Hx   . Breast cancer Neg Hx     SOCIAL HX:  Works Herbalist  Drinks etoh  Not smoker  No guns  Wearing seat belt  Safe in relationship    Current Outpatient Medications:  .  acetaminophen (TYLENOL) 500 MG tablet,  Take 1-2 tablets (500-1,000 mg total) by mouth every 8 (eight) hours as needed., Disp: 90 tablet, Rfl: 3 .  amitriptyline (ELAVIL) 10 MG tablet, Take 10 mg by mouth at bedtime., Disp: , Rfl:  .  Levonorgestrel (LILETTA, 52 MG,) 19.5 MCG/DAY IUD IUD, by Intrauterine route., Disp: , Rfl:  .  meloxicam (MOBIC) 7.5 MG tablet, Take 1 tablet (7.5 mg total) by mouth 2 (two) times daily as needed for pain., Disp: 60 tablet, Rfl: 2 .  ondansetron (ZOFRAN) 4 MG tablet, Take 1 tablet (4 mg total) by mouth every 6 (six) hours., Disp: 12 tablet, Rfl: 0 .  predniSONE (DELTASONE) 10 MG tablet, Take 1 tablet (10 mg total) by mouth daily with breakfast. With food, Disp: 7 tablet, Rfl: 0  EXAM:  VITALS per patient if applicable:  GENERAL: alert, oriented, appears well and in no acute distress  HEENT: atraumatic, conjunttiva clear, no obvious abnormalities on inspection of external nose and ears  NECK: normal movements of the head and neck  LUNGS: on inspection no signs of respiratory distress, breathing rate appears normal, no obvious gross SOB, gasping or wheezing  CV: no obvious cyanosis  MS: moves all visible extremities without noticeable abnormality  PSYCH/NEURO: pleasant and cooperative, no obvious depression or anxiety, speech and thought processing grossly intact  ASSESSMENT AND PLAN:  Discussed the following assessment and plan:  Lumbar radiculopathy with abnormal MRI spine L See HPI failed above 2 courses of steroids not helpful orally and afraid of robaxin with driving only takes on weekends  Will disc with Dr. Sharlet Salina if epidural back inj would benefit as she still has pain from back radiating down legs, leg weakness, numbness and not had recovery with tx above  Continue PT and TENS   Obesity (BMI 30-39.9) -rec exercise I.e walking starting at 20 minutes up to 1 hour most days of week  rec premier protein/pirq shakes and healthy diet choices   -we discussed possible serious and likely  etiologies, options for evaluation and workup, limitations of telemedicine visit vs in person visit, treatment, treatment risks and precautions. Pt prefers to treat via telemedicine empirically rather then risking or undertaking an in person visit at this moment. Patient agrees to seek prompt in person care if worsening, new symptoms arise, or if is not improving with treatment.   I discussed the assessment and treatment plan with the patient. The patient was provided an opportunity to ask questions and all were answered. The patient agreed with the plan and demonstrated an understanding of the instructions.   The patient was advised to call back or seek an in-person evaluation if the symptoms worsen or if the condition fails to improve as anticipated.  Time spent 20-29 minutes  Delorise Jackson, MD

## 2019-12-24 ENCOUNTER — Ambulatory Visit: Payer: Self-pay | Admitting: Internal Medicine

## 2020-01-05 ENCOUNTER — Other Ambulatory Visit: Payer: Self-pay

## 2020-01-05 ENCOUNTER — Emergency Department (HOSPITAL_COMMUNITY)
Admission: EM | Admit: 2020-01-05 | Discharge: 2020-01-05 | Disposition: A | Discharge status: Home / Self Care | Payer: 59 | Attending: Emergency Medicine | Admitting: Emergency Medicine

## 2020-01-05 ENCOUNTER — Emergency Department (HOSPITAL_COMMUNITY): Payer: 59

## 2020-01-05 ENCOUNTER — Encounter (HOSPITAL_COMMUNITY): Payer: Self-pay

## 2020-01-05 DIAGNOSIS — S0081XA Abrasion of other part of head, initial encounter: Secondary | ICD-10-CM | POA: Insufficient documentation

## 2020-01-05 DIAGNOSIS — R569 Unspecified convulsions: Secondary | ICD-10-CM | POA: Diagnosis not present

## 2020-01-05 DIAGNOSIS — R202 Paresthesia of skin: Secondary | ICD-10-CM | POA: Diagnosis not present

## 2020-01-05 DIAGNOSIS — W108XXA Fall (on) (from) other stairs and steps, initial encounter: Secondary | ICD-10-CM | POA: Insufficient documentation

## 2020-01-05 DIAGNOSIS — Y9389 Activity, other specified: Secondary | ICD-10-CM | POA: Diagnosis not present

## 2020-01-05 DIAGNOSIS — Y929 Unspecified place or not applicable: Secondary | ICD-10-CM | POA: Insufficient documentation

## 2020-01-05 DIAGNOSIS — S060X9A Concussion with loss of consciousness of unspecified duration, initial encounter: Secondary | ICD-10-CM | POA: Diagnosis not present

## 2020-01-05 DIAGNOSIS — Y999 Unspecified external cause status: Secondary | ICD-10-CM | POA: Insufficient documentation

## 2020-01-05 DIAGNOSIS — I498 Other specified cardiac arrhythmias: Secondary | ICD-10-CM | POA: Diagnosis not present

## 2020-01-05 DIAGNOSIS — R4182 Altered mental status, unspecified: Secondary | ICD-10-CM | POA: Diagnosis present

## 2020-01-05 LAB — URINALYSIS, ROUTINE W REFLEX MICROSCOPIC
Bilirubin Urine: NEGATIVE
Glucose, UA: NEGATIVE mg/dL
Ketones, ur: NEGATIVE mg/dL
Leukocytes,Ua: NEGATIVE
Nitrite: NEGATIVE
Protein, ur: NEGATIVE mg/dL
Specific Gravity, Urine: 1.023 (ref 1.005–1.030)
pH: 5 (ref 5.0–8.0)

## 2020-01-05 LAB — I-STAT BETA HCG BLOOD, ED (MC, WL, AP ONLY): I-stat hCG, quantitative: <5 m[IU]/mL (ref <5)

## 2020-01-05 LAB — CBC
HCT: 42.3 % (ref 36.0–46.0)
Hemoglobin: 13.4 g/dL (ref 12.0–15.0)
MCH: 29.3 pg (ref 26.0–34.0)
MCHC: 31.7 g/dL (ref 30.0–36.0)
MCV: 92.4 fL (ref 80.0–100.0)
Platelets: 237 10*3/uL (ref 150–400)
RBC: 4.58 MIL/uL (ref 3.87–5.11)
RDW: 14 % (ref 11.5–15.5)
WBC: 8 10*3/uL (ref 4.0–10.5)
nRBC: 0 % (ref 0.0–0.2)

## 2020-01-05 LAB — COMPREHENSIVE METABOLIC PANEL
ALT: 16 U/L (ref 0–44)
AST: 21 U/L (ref 15–41)
Albumin: 4 g/dL (ref 3.5–5.0)
Alkaline Phosphatase: 55 U/L (ref 38–126)
Anion gap: 5 (ref 5–15)
BUN: 13 mg/dL (ref 6–20)
CO2: 26 mmol/L (ref 22–32)
Calcium: 8.8 mg/dL — ABNORMAL LOW (ref 8.9–10.3)
Chloride: 106 mmol/L (ref 98–111)
Creatinine, Ser: 0.65 mg/dL (ref 0.44–1.00)
GFR calc Af Amer: >60 mL/min (ref >60)
GFR calc non Af Amer: >60 mL/min (ref >60)
Glucose, Bld: 101 mg/dL — ABNORMAL HIGH (ref 70–99)
Potassium: 4.7 mmol/L (ref 3.5–5.1)
Sodium: 137 mmol/L (ref 135–145)
Total Bilirubin: 0.8 mg/dL (ref 0.3–1.2)
Total Protein: 6.8 g/dL (ref 6.5–8.1)

## 2020-01-05 LAB — CK: Total CK: 60 U/L (ref 38–234)

## 2020-01-05 LAB — LACTIC ACID, PLASMA: Lactic Acid, Venous: 0.8 mmol/L (ref 0.5–1.9)

## 2020-01-05 MED ORDER — ONDANSETRON 4 MG PO TBDP
4.0000 mg | ORAL_TABLET | Freq: Once | ORAL | Status: AC
  Administered 2020-01-05: 4 mg via ORAL
  Filled 2020-01-05: qty 1

## 2020-01-05 MED ORDER — ACETAMINOPHEN 500 MG PO TABS
1000.0000 mg | ORAL_TABLET | Freq: Once | ORAL | Status: AC
  Administered 2020-01-05: 1000 mg via ORAL
  Filled 2020-01-05: qty 2

## 2020-01-05 NOTE — ED Triage Notes (Signed)
Pt reports has history of POTTs and reports she was walking down her stairs at 0630 and woke up on the floor at the bottom of the stairs at 7:45.  Reports had been incontinent of urine.  Abrasion to r side of forehead, and c/o pain on r side of body.

## 2020-01-05 NOTE — Discharge Instructions (Addendum)
You were evaluated in the Emergency Department and after careful evaluation, we did not find any emergent condition requiring admission or further testing in the hospital.  Your symptoms may have been due to a seizure.  Overall your testing here is reassuring.  The seizure may have been caused by trauma to the head.  You may have a concussion.  As discussed, please practice mental and physical rest and avoid driving and follow-up closely with your neurologist.  Please return to the Emergency Department if you experience any worsening of your condition.  We encourage you to follow up with a primary care provider.  Thank you for allowing [place] to be a part of your care.

## 2020-01-05 NOTE — ED Provider Notes (Signed)
[facility] Emergency Department Provider Note MRN:  [id number]  Arrival date & time: 01/05/20     Chief Complaint   Loss of Consciousness   History of Present Illness   Leah Hartman is a 24 y.o. year-old female with a history of POTS presenting to the ED with chief complaint of loss of consciousness.  Patient explains that she lost consciousness just before going down the stairs.  She estimates that she lost consciousness at 630 on top of the stairs and woke up at 745 at the bottom of the stairs.  She is endorsing pain and soreness to the right side of her body.  Endorsing urinary incontinence during the unconscious episode.  Denies ever having a seizure.  No recent fever or cough, no recent nausea or vomiting.  Small abrasion to the right side of her head, no nausea or vomiting, no numbness or weakness to the arms or legs but a sensation of pins-and-needles to the right side of her body.  Review of Systems  A complete 10 system review of systems was obtained and all systems are negative except as noted in the HPI and PMH.   Patient's Health History    Past Medical History:  Diagnosis Date  . Abdominal pain   . Dysmenorrhea 05/10/2015  . Endometriosis   . Gastritis    egd 04/07/15   . Nausea & vomiting   . Ovarian cyst   . Ovarian cyst rupture   . POTS (postural orthostatic tachycardia syndrome)   . Syncope and collapse   . Vitamin D deficiency     Past Surgical History:  Procedure Laterality Date  . APPENDECTOMY    . ESOPHAGOGASTRODUODENOSCOPY N/A 04/07/2015   Procedure: ESOPHAGOGASTRODUODENOSCOPY (EGD);  Surgeon: Lucilla Lame, MD;  Location: [facility];  Service: Gastroenterology;  Laterality: N/A;  . INTRAUTERINE DEVICE (IUD) INSERTION N/A 05/15/2015   Procedure: INTRAUTERINE DEVICE (IUD) INSERTION;  Surgeon: Brayton Mars, MD;  Location: [facility];  Service: Gynecology;  Laterality: N/A;  . LAPAROSCOPY N/A 05/15/2015   Procedure: with  excision and fulgeration of endometriosis;  Surgeon: Brayton Mars, MD;  Location: [facility];  Service: Gynecology;  Laterality: N/A;  with excision and fulgeration of endometriosis  . OVARIAN CYST SURGERY Bilateral   . TONSILLECTOMY AND ADENOIDECTOMY    . TYMPANOSTOMY TUBE PLACEMENT      Family History  Problem Relation Age of Onset  . Hypertension Mother   . Diabetes Father   . Learning disabilities Father   . Birth defects Maternal Grandmother   . Birth defects Maternal Grandfather   . Birth defects Paternal Grandmother   . Birth defects Paternal Grandfather   . Colon cancer Neg Hx   . Ovarian cancer Neg Hx   . Breast cancer Neg Hx     Social History   Socioeconomic History  . Marital status: Single    Spouse name: Not on file  . Number of children: Not on file  . Years of education: Not on file  . Highest education level: Not on file  Occupational History  . Occupation: [employer]  Tobacco Use  . Smoking status: Never Smoker  . Smokeless tobacco: Never Used  Substance and Sexual Activity  . Alcohol use: No    Alcohol/week: 0.0 standard drinks  . Drug use: No  . Sexual activity: Yes    Birth control/protection: I.U.D.  Other Topics Concern  . Not on file  Social History Narrative   Works vet clinic  Single    Drinks etoh    Not smoker    No guns    Wearing seat belt    Safe in relationship    Social Determinants of Health   Financial Resource Strain:   . Difficulty of Paying Living Expenses: Not on file  Food Insecurity:   . Worried About Programme researcher, broadcasting/film/video in the Last Year: Not on file  . Ran Out of Food in the Last Year: Not on file  Transportation Needs:   . Lack of Transportation (Medical): Not on file  . Lack of Transportation (Non-Medical): Not on file  Physical Activity:   . Days of Exercise per Week: Not on file  . Minutes of Exercise per Session: Not on file  Stress:   . Feeling of Stress : Not on file  Social Connections:    . Frequency of Communication with Friends and Family: Not on file  . Frequency of Social Gatherings with Friends and Family: Not on file  . Attends Religious Services: Not on file  . Active Member of Clubs or Organizations: Not on file  . Attends Banker Meetings: Not on file  . Marital Status: Not on file  Intimate Partner Violence:   . Fear of Current or Ex-Partner: Not on file  . Emotionally Abused: Not on file  . Physically Abused: Not on file  . Sexually Abused: Not on file     Physical Exam   Vitals:   01/05/20 1030 01/05/20 1045  BP: 117/80   Pulse: 80 (!) 102  Resp: 12 17  Temp:    SpO2: 100% 100%    CONSTITUTIONAL: Well-appearing, NAD NEURO:  Alert and oriented x 3, normal and symmetric strength and sensation, normal coordination, normal speech, no visual field cuts, no neglect EYES:  eyes equal and reactive ENT/NECK:  no LAD, no JVD CARDIO: Regular rate, well-perfused, normal S1 and S2 PULM:  CTAB no wheezing or rhonchi GI/GU:  normal bowel sounds, non-distended, non-tender MSK/SPINE:  No gross deformities, no edema SKIN:  no rash, atraumatic PSYCH:  Appropriate speech and behavior  *Additional and/or pertinent findings included in MDM below  Diagnostic and Interventional Summary    EKG Interpretation  Date/Time:  Wednesday January 05 2020 09:33:10 EST Ventricular Rate:  96 PR Interval:    QRS Duration: 89 QT Interval:  355 QTC Calculation: 449 R Axis:   69 Text Interpretation: Sinus rhythm Borderline Q waves in lateral leads No significant change was found Confirmed by Kennis Carina [phone]) on 01/05/2020 9:38:03 AM      Cardiac Monitoring Interpretation:  Labs Reviewed  COMPREHENSIVE METABOLIC PANEL - Abnormal; Notable for the following components:      Result Value   Glucose, Bld 101 (*)    Calcium 8.8 (*)    All other components within normal limits  URINALYSIS, ROUTINE W REFLEX MICROSCOPIC - Abnormal; Notable for the following  components:   APPearance HAZY (*)    Hgb urine dipstick SMALL (*)    Bacteria, UA RARE (*)    All other components within normal limits  CBC  LACTIC ACID, PLASMA  CK  I-STAT BETA HCG BLOOD, ED ([facility], [facility], [facility] ONLY)    CT HEAD WO CONTRAST  Final Result    CT CERVICAL SPINE WO CONTRAST  Final Result      Medications  acetaminophen (TYLENOL) tablet 1,000 mg (1,000 mg Oral Given 01/05/20 0952)  ondansetron (ZOFRAN-ODT) disintegrating tablet 4 mg (4 mg Oral Given 01/05/20 0953)  Procedures  /  Critical Care Procedures  ED Course and Medical Decision Making  I have reviewed the triage vital signs, the nursing notes, and pertinent available records from the EMR.  Pertinent labs & imaging results that were available during my care of the patient were reviewed by me and considered in my medical decision making (see below for details).     Question of syncopal episode versus seizure, doubt ischemic stroke.  NIH stroke scale currently 0.  Patient has pins-and-needles sensation to the right side of her body, but I favor this to be related to laying on the right side of her body for over an hour during her unconscious episode.  She has normal sensation and strength bilaterally.  Suspect possible concussion.  She has had syncopal episodes in the past related to POTS, however she is never lost bladder control.  This raises concern for possible seizure.  Will obtain CT head to exclude bleed as the cause of seizure.  Labs to exclude secondary cause of seizure such as electrolyte disturbance.  Given the NIH stroke scale of 0 and her otherwise healthy nature, the concern for ischemic stroke is minimal and she is not a TPA candidate.  11 AM update: EKG is unchanged, work-up is reassuring.  Discussed case with Dr. Gerilyn Pilgrim, will treat as first-time seizure, appropriate for outpatient follow-up.  Suspect underlying concussion given her continued headache, advised mental and physical rest.  Also advised to  avoid any driving or operating of heavy machinery or any other activities that would put her in danger if she were to have a repeat episode.  Elmer Sow. Pilar Plate, MD [facility] Emergency [facility] [email or web address]  Final Clinical Impressions(s) / ED Diagnoses     ICD-10-CM   1. Seizure (HCC)  R56.9   2. Concussion with loss of consciousness, initial encounter  [email or web address]     ED Discharge Orders    None       Discharge Instructions Discussed with and Provided to Patient:     Discharge Instructions     You were evaluated in the Emergency Department and after careful evaluation, we did not find any emergent condition requiring admission or further testing in the hospital.  Your symptoms may have been due to a seizure.  Overall your testing here is reassuring.  The seizure may have been caused by trauma to the head.  You may have a concussion.  As discussed, please practice mental and physical rest and avoid driving and follow-up closely with your neurologist.  Please return to the Emergency Department if you experience any worsening of your condition.  We encourage you to follow up with a primary care provider.  Thank you for allowing [place] to be a part of your care.       Sabas Sous, MD 01/05/20 1120

## 2020-01-06 ENCOUNTER — Telehealth: Payer: Self-pay | Admitting: Internal Medicine

## 2020-01-06 NOTE — Telephone Encounter (Signed)
Pt called and stated that she was seen in the ED yesterday for a Seizure and they told her to contact her PCP for a Referral to Neurology . She wants to see Dr. Everlena Cooper with [facility]

## 2020-01-07 ENCOUNTER — Encounter: Payer: Self-pay | Admitting: Internal Medicine

## 2020-01-07 ENCOUNTER — Telehealth: Payer: Self-pay | Admitting: Internal Medicine

## 2020-01-07 DIAGNOSIS — R569 Unspecified convulsions: Secondary | ICD-10-CM

## 2020-01-07 NOTE — Telephone Encounter (Signed)
Patient informed and verbalized understanding

## 2020-01-07 NOTE — Telephone Encounter (Signed)
Pt was calling to inquire about referral to neurology. Pt states that she needs this done ASAP because she can not drive until she in seen. She states that this was supposed to be an emergency referral. Please advise.

## 2020-01-07 NOTE — Telephone Encounter (Signed)
Have placed this referral for the patient. I will forward to Dr McLean-Scocuzza.

## 2020-01-09 ENCOUNTER — Encounter: Payer: Self-pay | Admitting: Internal Medicine

## 2020-01-10 NOTE — Telephone Encounter (Signed)
Leah Hartman thanks for covering I getting to my box slowly but surely   [name],  Pt preferred Dr. Everlena Cooper and was requested in referral, can you call and request this for her urgent referral for loss of consciousness and h/o POTS needs w/u seizures   Thanks TMS

## 2020-01-11 ENCOUNTER — Ambulatory Visit: Payer: 59 | Admitting: Neurology

## 2020-01-11 ENCOUNTER — Telehealth: Payer: Self-pay | Admitting: Neurology

## 2020-01-11 ENCOUNTER — Encounter: Payer: Self-pay | Admitting: Internal Medicine

## 2020-01-11 ENCOUNTER — Telehealth: Payer: Self-pay | Admitting: Internal Medicine

## 2020-01-11 ENCOUNTER — Other Ambulatory Visit: Payer: Self-pay

## 2020-01-11 ENCOUNTER — Encounter: Payer: Self-pay | Admitting: Neurology

## 2020-01-11 VITALS — BP 123/84 | HR 110 | Temp 98.0°F | Ht 69.0 in | Wt 226.3 lb

## 2020-01-11 DIAGNOSIS — R55 Syncope and collapse: Secondary | ICD-10-CM

## 2020-01-11 DIAGNOSIS — R569 Unspecified convulsions: Secondary | ICD-10-CM

## 2020-01-11 DIAGNOSIS — G901 Familial dysautonomia [Riley-Day]: Secondary | ICD-10-CM | POA: Insufficient documentation

## 2020-01-11 DIAGNOSIS — Z9181 History of falling: Secondary | ICD-10-CM

## 2020-01-11 DIAGNOSIS — I498 Other specified cardiac arrhythmias: Secondary | ICD-10-CM | POA: Diagnosis not present

## 2020-01-11 DIAGNOSIS — G90A Postural orthostatic tachycardia syndrome (POTS): Secondary | ICD-10-CM

## 2020-01-11 NOTE — Telephone Encounter (Signed)
Pt saw the neurologist today and did not feel as if she got the answers she needed. Pt has questions about going back to work. There are no available appts this week. Please advise.

## 2020-01-11 NOTE — Telephone Encounter (Signed)
Patient called back to inform that she was advised by her pcp that she will not release her to go back to work as she will be leaving it up to the patient cardiologist and neurologist for review for this case. Patient would like to know what would be the next step in getting her released back to work

## 2020-01-11 NOTE — Patient Instructions (Signed)
Your neurological exam is normal thankfully.  Nevertheless, it is possible that you had a single/isolated seizure event.  It is also possible that you had a syncopal event in the context of your POTS. As discussed, you should not drive a motor vehicle for 6 months so long as you stay free of any further events of collapse/seizure-like events. We do not have to put you on seizure medication at this time. We will do a brain scan, called MRI and call you with the test results. We will have to schedule you for this on a separate date. This test requires authorization from your insurance, and we will take care of the insurance process. We will do an EEG (brainwave test), which we will schedule. We will call you with the results. Please follow-up with your cardiologist.  If you need a note to go back to work you can request this through your primary care physician. There are no work-related restrictions from my end of things.  Please remember, common seizure triggers are: Sleep deprivation, dehydration, overheating, stress, hypoglycemia or skipping meals, certain medications or excessive alcohol use, especially stopping alcohol abruptly if you have had heavy alcohol use before (aka alcohol withdrawal seizure). If you have a prolonged seizure over 2-5 minutes or back to back seizures, call or have someone call 911 or take you to the nearest emergency room. You cannot drive a car or operate any other machinery or vehicle within 6 months of a seizure. Please do not swim alone or take a tub bath for safety. Do not climb on ladders or be at heights alone. Do not cook with large quantities of boiling water or oil for safety. Please ensure the water temperature at home is not too high. When carrying or caring for small children and infants, make sure you sit down when holding the child or feeding the child or changing them to minimize risk for injury to the child or to you if you were to have a seizure.  Avoid taking  Wellbutrin, narcotic pain medications and tramadol, as they can lower seizure threshold.  As per [facility] statutes, patients with seizures and epilepsy are not allowed to drive until they have been seizure-free for at least 6 months. This also applies to driving or using heavy equipment or power tools.

## 2020-01-11 NOTE — Telephone Encounter (Signed)
Patient called back in and was informed of the information below. Patient verbalized understanding

## 2020-01-11 NOTE — Telephone Encounter (Signed)
Left message to return call.   Patient was seen today, 01/11/20 by Neurology. They would like patient to have Brain MRI along with an EEG and to follow up with her Cardiologist. Dr French Ana is agreeable with this and suggests the patient contact her specialists with further questions.

## 2020-01-11 NOTE — Progress Notes (Signed)
Subjective:    Patient ID: Leah Hartman is a 24 y.o. female.  HPI     HPI:  Dear Dr. Judie Grieve,   I saw your patient, Leah Hartman, upon your kind request to my neurologic clinic today for initial consultation of her syncopal event versus seizure event.  The patient is accompanied by her uncle today. As you know, Leah Hartman is a 24 year old right-handed woman with an underlying medical history of POTS, history of syncope, vitamin D deficiency, endometriosis, and obesity, who presented to the emergency room on 01/05/2020 after a fall downstairs.  She had a loss of consciousness.  She has a history of syncope in the context of her POTS.  She had bladder incontinence.  Therefore the suspicion for seizure was raised.  She has never had a seizure before.  I reviewed the emergency room records.  She had a head CT without contrast as well as cervical spine CT without contrast on 01/05/2020 and I reviewed the results: IMPRESSION: CT of the head: No acute intracranial abnormality noted.   CT of the cervical spine: No acute abnormality noted. She was advised not to drive.  I reviewed her blood work and urinalysis from the emergency room.  She had a normal CK level, urinalysis was negative.  She estimates that she had loss of consciousness for about 45 minutes approximately.  She was alone in the house, she fell down several stairs.  She was able to eventually call her uncle who lives very close by and he took her to the emergency room.  She has never had a seizure.  She is not sure if she had any twitching or abnormal movements, she did have urinary incontinence.  She has no family history of epilepsy.  She has had syncopal events before.  It takes her a day or 2 to recuperate after her syncopal events typically.  She had seen Dr. Graciela Husbands but has not seen him in a while she says.  She does not have a family history of dysautonomia.  She denies any one-sided weakness or numbness or tingling but after a  syncopal event she feels that her right arm gets numb.  She has had stress, she denies any history of dehydration, she tries to hydrate well with water, limits her caffeine, does not smoke cigarettes but vapes nicotine.  She drinks alcohol very occasionally.  She typically sleeps fairly well but sometimes she has back pain and her sleep is interrupted.  She does not snore.  Her Past Medical History Is Significant For: Past Medical History:  Diagnosis Date  . Abdominal pain   . Dysmenorrhea 05/10/2015  . Endometriosis   . Gastritis    egd 04/07/15   . Nausea & vomiting   . Ovarian cyst   . Ovarian cyst rupture   . POTS (postural orthostatic tachycardia syndrome)   . Syncope and collapse   . Vitamin D deficiency     Her Past Surgical History Is Significant For: Past Surgical History:  Procedure Laterality Date  . APPENDECTOMY    . ESOPHAGOGASTRODUODENOSCOPY N/A 04/07/2015   Procedure: ESOPHAGOGASTRODUODENOSCOPY (EGD);  Surgeon: Midge Minium, MD;  Location: [facility];  Service: Gastroenterology;  Laterality: N/A;  . INTRAUTERINE DEVICE (IUD) INSERTION N/A 05/15/2015   Procedure: INTRAUTERINE DEVICE (IUD) INSERTION;  Surgeon: Herold Harms, MD;  Location: [facility];  Service: Gynecology;  Laterality: N/A;  . LAPAROSCOPY N/A 05/15/2015   Procedure: with excision and fulgeration of endometriosis;  Surgeon: Herold Harms, MD;  Location: [facility];  Service: Gynecology;  Laterality: N/A;  with excision and fulgeration of endometriosis  . OVARIAN CYST SURGERY Bilateral   . TONSILLECTOMY AND ADENOIDECTOMY    . TYMPANOSTOMY TUBE PLACEMENT      Her Family History Is Significant For: Family History  Problem Relation Age of Onset  . Hypertension Mother   . Diabetes Father   . Learning disabilities Father   . Birth defects Maternal Grandmother   . Birth defects Maternal Grandfather   . Birth defects Paternal Grandmother   . Birth defects Paternal Grandfather   . Colon  cancer Neg Hx   . Ovarian cancer Neg Hx   . Breast cancer Neg Hx     Her Social History Is Significant For: Social History   Socioeconomic History  . Marital status: Single    Spouse name: Not on file  . Number of children: Not on file  . Years of education: Not on file  . Highest education level: Not on file  Occupational History  . Occupation: [employer]  Tobacco Use  . Smoking status: Never Smoker  . Smokeless tobacco: Never Used  Substance and Sexual Activity  . Alcohol use: No    Alcohol/week: 0.0 standard drinks  . Drug use: No  . Sexual activity: Yes    Birth control/protection: I.U.D.  Other Topics Concern  . Not on file  Social History Narrative   Works vet clinic    Single    Drinks etoh    Not smoker    No guns    Wearing seat belt    Safe in relationship    Social Determinants of Health   Financial Resource Strain:   . Difficulty of Paying Living Expenses: Not on file  Food Insecurity:   . Worried About Charity fundraiser in the Last Year: Not on file  . Ran Out of Food in the Last Year: Not on file  Transportation Needs:   . Lack of Transportation (Medical): Not on file  . Lack of Transportation (Non-Medical): Not on file  Physical Activity:   . Days of Exercise per Week: Not on file  . Minutes of Exercise per Session: Not on file  Stress:   . Feeling of Stress : Not on file  Social Connections:   . Frequency of Communication with Friends and Family: Not on file  . Frequency of Social Gatherings with Friends and Family: Not on file  . Attends Religious Services: Not on file  . Active Member of Clubs or Organizations: Not on file  . Attends Archivist Meetings: Not on file  . Marital Status: Not on file    Her Allergies Are:  No Known Allergies:   Her Current Medications Are:  Outpatient Encounter Medications as of 01/11/2020  Medication Sig  . Levonorgestrel (LILETTA, 52 MG,) 19.5 MCG/DAY IUD IUD by Intrauterine  route.  . meloxicam (MOBIC) 7.5 MG tablet Take 1 tablet (7.5 mg total) by mouth 2 (two) times daily as needed for pain.  . [DISCONTINUED] acetaminophen (TYLENOL) 500 MG tablet Take 1-2 tablets (500-1,000 mg total) by mouth every 8 (eight) hours as needed.  . [DISCONTINUED] amitriptyline (ELAVIL) 10 MG tablet Take 10 mg by mouth at bedtime.  . [DISCONTINUED] ondansetron (ZOFRAN) 4 MG tablet Take 1 tablet (4 mg total) by mouth every 6 (six) hours.  . [DISCONTINUED] predniSONE (DELTASONE) 10 MG tablet Take 1 tablet (10 mg total) by mouth daily with breakfast. With food   No  facility-administered encounter medications on file as of 01/11/2020.  :  Review of Systems:  Out of a complete 14 point review of systems, all are reviewed and negative with the exception of these symptoms as listed below: Review of Systems  Neurological:       Reports a week ago wed she fell down the stairs at her house and busted her lip and urinated on her self. Referred for seizure work up. Pt does have a hx for POTS    Objective:  Neurological Exam  Physical Exam Physical Examination:   Vitals:   01/11/20 1422 01/11/20 1429  BP: 114/79 123/84  Pulse: 98 (!) 110  Temp: 98 F (36.7 C)     General Examination: The patient is a very pleasant 24 y.o. female in no acute distress. She appears well-developed and well-nourished and well groomed. No orthostatic lightheadedness, no orthostatic hypotension, mild tachycardia with standing up.  HEENT: Normocephalic, atraumatic, pupils are equal, round and reactive to light and accommodation. Funduscopic exam is normal with sharp disc margins noted. Extraocular tracking is good without limitation to gaze excursion or nystagmus noted. Normal smooth pursuit is noted. Hearing is grossly intact. Face is symmetric with normal facial animation and normal facial sensation. Speech is clear with no dysarthria noted. There is no hypophonia. There is no lip, neck/head, jaw or voice  tremor. Neck is supple with full range of passive and active motion. There are no carotid bruits on auscultation. Oropharynx exam reveals: no abn.   Chest: Clear to auscultation without wheezing, rhonchi or crackles noted.  Heart: S1+S2+0, regular and normal without murmurs, rubs or gallops noted.   Abdomen: Soft, non-tender and non-distended with normal bowel sounds appreciated on auscultation.  Extremities: There is no pitting edema in the distal lower extremities bilaterally. Pedal pulses are intact.  Skin: Warm and dry without trophic changes noted.  Musculoskeletal: exam reveals no obvious joint deformities, tenderness or joint swelling or erythema.   Neurologically:  Mental status: The patient is awake, alert and oriented in all 4 spheres. Her immediate and remote memory, attention, language skills and fund of knowledge are appropriate. There is no evidence of aphasia, agnosia, apraxia or anomia. Speech is clear with normal prosody and enunciation. Thought process is linear. Mood is normal and affect is normal.  Cranial nerves II - XII are as described above under HEENT exam. In addition: shoulder shrug is normal with equal shoulder height noted. Motor exam: Normal bulk, strength and tone is noted. There is no drift, tremor or rebound. Romberg is negative. Reflexes are 2+ throughout. Babinski: Toes are flexor bilaterally. Fine motor skills and coordination: intact with normal finger taps, normal hand movements, normal rapid alternating patting, normal foot taps and normal foot agility.  Cerebellar testing: No dysmetria or intention tremor on finger to nose testing, but slow and deliberate movements noted. Heel to shin is unremarkable bilaterally. There is no truncal or gait ataxia.  Sensory exam: intact to light touch, pinprick, vibration, temperature sense in the upper and lower extremities, With the exception of slight decrease in vibration sense in the right hand compared to the left  hand.  Gait, station and balance: She stands easily. No veering to one side is noted. No leaning to one side is noted. Posture is age-appropriate and stance is narrow based. Gait shows normal stride length and normal pace. No problems turning are noted. Tandem walk is unremarkable.   Assessment and Plan:   In summary, Leah Hartman is a very pleasant  24 y.o.-year old female with an underlying medical history of POTS, history of syncope, vitamin D deficiency, endometriosis, and obesity, who Presents for evaluation of a recent event with loss of consciousness and collapse, resulting in a fall downstairs, associated with urinary incontinence and prolonged loss of consciousness, no witnessed seizure event but seizure is certainly in the differential diagnosis.  She does have a history of syncopal events and collapse given her POTS. A prolonged syncopal event is certainly also in the differential diagnosis.  Neurological exam is unremarkable and she is reassured.  Nevertheless, I recommended additional work-up in the form of brain MRI with and without contrast and EEG.  Given that this is a possible seizure, She is advised not to drive for 6 months.  She is made aware of seizure triggers.  She is encouraged to stay well-hydrated, well rested.  She endorses stress.  She has been on light duty.  From my end of things are no work-related restrictions, she is advised of the [place] driving rule of not driving for 6 months until she is event free.  She is encouraged to make a follow-up appointment with her cardiologist as well.  She had no significant orthostatic blood pressure drop but had mild tachycardia when standing up. We will call her with her MRI and EEG results.  She is advised to follow-up routinely in the next 2 or 3 months to see the nurse practitioner.  If all goes well, she can resume driving 6 months after the collapse event. Given that this was a single and isolated seizure-like event or  possible seizure, we do not have to put her on antiepileptic medication unless it is warranted by an abnormal EEG.  Again, we We will call her with results as they become available. I answered all her questions today and the patient and her uncle were in agreement.  Thank you very much for allowing me to participate in the care of this nice patient. If I can be of any further assistance to you please do not hesitate to call me at [phone].  Sincerely,   Huston Foley, MD, PhD

## 2020-01-11 NOTE — Telephone Encounter (Signed)
Please advise 

## 2020-01-11 NOTE — Telephone Encounter (Signed)
Neurology or cardiology should have the answers  If having seizures and EEG 24 hours or longer can help  She may need to disc this with them  Also make appt with Dr. Graciela Husbands  She call always neurology back for questions    TMS

## 2020-01-12 ENCOUNTER — Encounter: Payer: Self-pay | Admitting: Neurology

## 2020-01-12 NOTE — Telephone Encounter (Signed)
I reached out to the pt. She sts she did receive the letter from the ED with the return to work date of 01/10/2020. Pt sts she gave this letter to her employer who then stated he would need another letter from the neurologist/cardiologist.  Pt sts she did not know what type of letter was needed or what the letter should say. I advised the pt to call her employer and advise we are not extending her leave from work at this time and 01/10/2020 return to work date was appropriate from the neurological stand point. Pt was advised to f/u with Dr. Graciela Husbands for cardio eval and f/u with employer on what is actually needed from our office.   Pt was agreeable and verbalized understanding. She reports she will call back if she needs anything further.

## 2020-01-12 NOTE — Telephone Encounter (Signed)
I have not taken this patient out of work and if this is rec based on POTS or seizures will need to come from specialist  Have pt call Dr. Graciela Husbands to schedule appt with him for POTS    Thanks TMS

## 2020-01-12 NOTE — Telephone Encounter (Signed)
I really have no reservation for her to return to work. I would say, if she was taken out of work by cardiology, she will have to get a letter from them or if she was taken out of work by Dr. Judie Grieve, she can get a letter from her.   Dr. Judie Grieve, I understand, that you are waiting for patient to be evaluated by cardiology, then she cannot return to work, unless you are satisfied, which I completely understand.   Leah Hartman, please advise patient of this and she can pursue her cardiac FU and w/u hopefully soon.

## 2020-01-12 NOTE — Telephone Encounter (Signed)
I see, thank you for the clarification, Dr. Judie Grieve; patient indicated, she had been on light duty, and I failed to inquire from whom, probably from the ER, as she did get a return to work letter from the ER. Megan, please call her back with this information and how she can access the letter.

## 2020-01-13 ENCOUNTER — Encounter: Payer: Self-pay | Admitting: Adult Health

## 2020-01-20 ENCOUNTER — Telehealth: Payer: Self-pay | Admitting: Neurology

## 2020-01-20 NOTE — Telephone Encounter (Signed)
patient is loosing her insurance and is in the process of getting medicaid she will call me once she gets it   [facility] Auth: [id number] (exp. 01/20/20 to 03/05/20)

## 2020-01-21 ENCOUNTER — Ambulatory Visit: Payer: Managed Care, Other (non HMO) | Admitting: Internal Medicine

## 2020-02-02 ENCOUNTER — Other Ambulatory Visit: Payer: 59

## 2020-02-24 ENCOUNTER — Encounter: Payer: Self-pay | Admitting: Internal Medicine

## 2020-02-24 ENCOUNTER — Ambulatory Visit (INDEPENDENT_AMBULATORY_CARE_PROVIDER_SITE_OTHER): Payer: 59 | Admitting: Internal Medicine

## 2020-02-24 ENCOUNTER — Other Ambulatory Visit: Payer: Self-pay

## 2020-02-24 VITALS — BP 116/79 | HR 89 | Ht 69.0 in | Wt 231.0 lb

## 2020-02-24 DIAGNOSIS — R000 Tachycardia, unspecified: Secondary | ICD-10-CM

## 2020-02-24 DIAGNOSIS — G901 Familial dysautonomia [Riley-Day]: Secondary | ICD-10-CM

## 2020-02-24 NOTE — Patient Instructions (Signed)
Medication Instructions:  - Your physician recommends that you continue on your current medications as directed. Please refer to the Current Medication list given to you today.  *If you need a refill on your cardiac medications before your next appointment, please call your pharmacy*   Lab Work: - Your physician recommends that you have lab work today: TSH   If you have labs (blood work) drawn today and your tests are completely normal, you will receive your results only by: Marland Kitchen MyChart Message (if you have MyChart) OR . A paper copy in the mail If you have any lab test that is abnormal or we need to change your treatment, we will call you to review the results.   Testing/Procedures: - none ordered   Follow-Up: At [facility], you and your health needs are our priority.  As part of our continuing mission to provide you with exceptional heart care, we have created designated Provider Care Teams.  These Care Teams include your primary Cardiologist (physician) and Advanced Practice Providers (APPs -  Physician Assistants and Nurse Practitioners) who all work together to provide you with the care you need, when you need it.  We recommend signing up for the patient portal called "MyChart".  Sign up information is provided on this After Visit Summary.  MyChart is used to connect with patients for Virtual Visits (Telemedicine).  Patients are able to view lab/test results, encounter notes, upcoming appointments, etc.  Non-urgent messages can be sent to your provider as well.   To learn more about what you can do with MyChart, go to [email or web address].    Your next appointment:   6 month(s)  The format for your next appointment:   In Person  Provider:   Sherryl Manges, MD   Other Instructions - [facility] [address] [place], [place] [address]  [phone] ext [phone]

## 2020-02-24 NOTE — Progress Notes (Signed)
,      Patient Care Team: McLean-Scocuzza, Pasty Spillers, MD as PCP - General (Internal Medicine) Duke Salvia, MD as Consulting Physician (Cardiology)   HPI  Leah Hartman is a 24 y.o. 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 These occurred in the wake of a mononucleosis infection.    She has had really pretty good year despite the fact that she is put on 30 pounds.  She is trying to lose weight.  No carbs and exercise to little avail.  She fell down her apartment steps.  She was looking at her watch at the top of the stairs She knew she was on the bottom of the stairs.  No antecedent recollections.  There are some question as to whether she had a seizure because she was incontinent.  She saw neurology.  Not have an EEG.  She is also some struggle with emotional lability.  She thinks maybe goes back to high school.  Much worse over the last year.  Wants help.    Past Medical History:  Diagnosis Date  . Abdominal pain   . Dysmenorrhea 05/10/2015  . Endometriosis   . Gastritis    egd 04/07/15   . Nausea & vomiting   . Ovarian cyst   . Ovarian cyst rupture   . POTS (postural orthostatic tachycardia syndrome)   . Syncope and collapse   . Vitamin D deficiency     Past Surgical History:  Procedure Laterality Date  . APPENDECTOMY    . ESOPHAGOGASTRODUODENOSCOPY N/A 04/07/2015   Procedure: ESOPHAGOGASTRODUODENOSCOPY (EGD);  Surgeon: Midge Minium, MD;  Location: [facility];  Service: Gastroenterology;  Laterality: N/A;  . INTRAUTERINE DEVICE (IUD) INSERTION N/A 05/15/2015   Procedure: INTRAUTERINE DEVICE (IUD) INSERTION;  Surgeon: Herold Harms, MD;  Location: [facility];  Service: Gynecology;  Laterality: N/A;  . LAPAROSCOPY N/A 05/15/2015   Procedure: with excision and fulgeration of endometriosis;  Surgeon: Herold Harms, MD;  Location: [facility];  Service: Gynecology;  Laterality: N/A;  with excision and fulgeration of endometriosis  .  OVARIAN CYST SURGERY Bilateral   . TONSILLECTOMY AND ADENOIDECTOMY    . TYMPANOSTOMY TUBE PLACEMENT      Current Outpatient Medications  Medication Sig Dispense Refill  . Levonorgestrel (LILETTA, 52 MG,) 19.5 MCG/DAY IUD IUD by Intrauterine route.     No current facility-administered medications for this visit.    No Known Allergies  Review of Systems negative except from HPI and PMH  Physical Exam BP 116/79 (BP Location: Left Arm, Patient Position: Sitting, Cuff Size: Normal)   Pulse 89   Ht 5\' 9"  (1.753 m)   Wt 231 lb (104.8 kg)   BMI 34.11 kg/m  Well developed and nourished in no acute distress HENT normal Neck supple with JVP-  Flat  Clear Regular rate and rhythm, no murmurs or gallops Abd-soft with active BS No Clubbing cyanosis edema Skin-warm and dry A & Oriented  Grossly normal sensory and motor function  ECG sinus at 89 Interval 113/08/37  Assessment and plan  Dysautonomia-like symptoms with normal vital signs  Depression/irritability  Obesity    Sounds like fell down the stairs.  It is not clear that it was triggered by either seizure or syncope.  Based on the circumstances I do not feel that there is an indication for restricting her driving.  Encouraged her to continue to work on exercise and soap and water repletion.  Lengthy discussion regarding the depression/irritability.  She will reach out  to her PCP to consider pharmacotherapy.  I have also suggested [facility] as a potentially affordable source of counseling support  More than 50% of 40 min was spent in counseling related to the above

## 2020-02-25 LAB — TSH: TSH: 1.36 u[IU]/mL (ref 0.450–4.500)

## 2020-03-14 IMAGING — CT CT CERVICAL SPINE W/O CM
3 of 4 series · 13 of 33 positions shown, 16 images · non-contrast
Comparison: 12/23/2017

CLINICAL DATA: Recent seizure activity and fall downstairs

EXAM:
CT HEAD WITHOUT CONTRAST
CT CERVICAL SPINE WITHOUT CONTRAST
TECHNIQUE: Multidetector CT imaging of the head and cervical spine was
performed following the standard protocol without intravenous
contrast. Multiplanar CT image reconstructions of the cervical spine
were also generated.

[Series 5: sagittal bone · sagittal · 0.37mm/px · 5 of 61 slices shown, 6 images]
[im 21/61  bone]
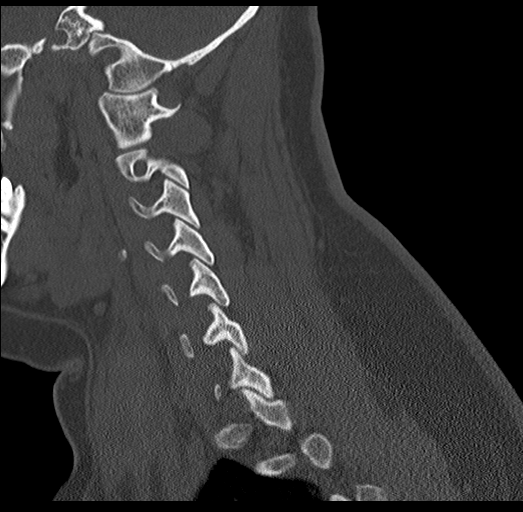
[im 26/61  bone]
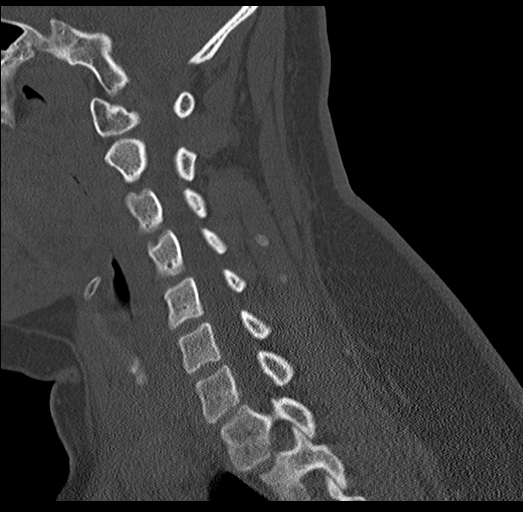
[im 31/61  soft-tissue]
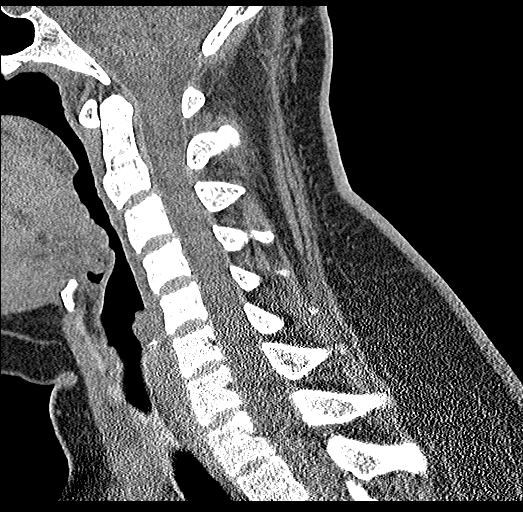
[im 31/61  bone]
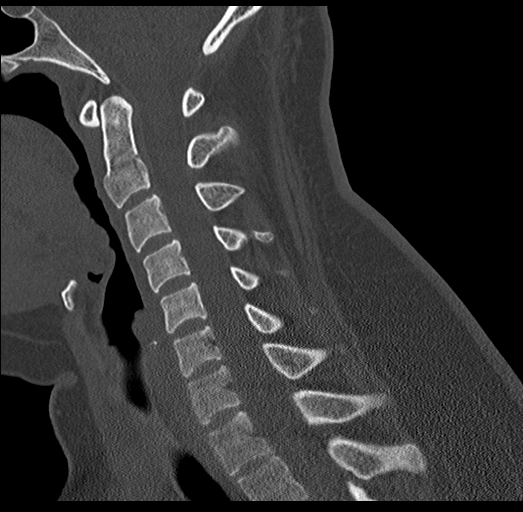
[im 36/61  bone]
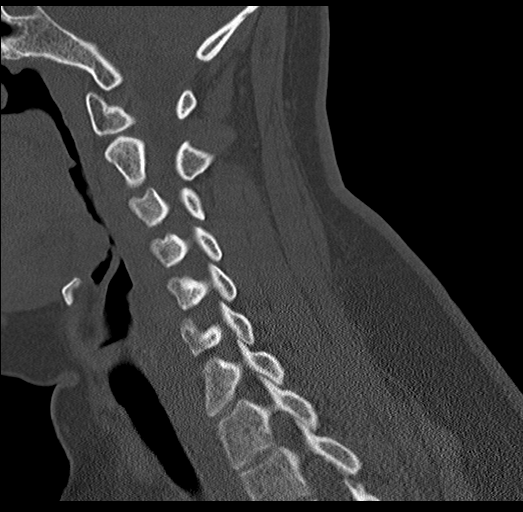
[im 41/61  bone]
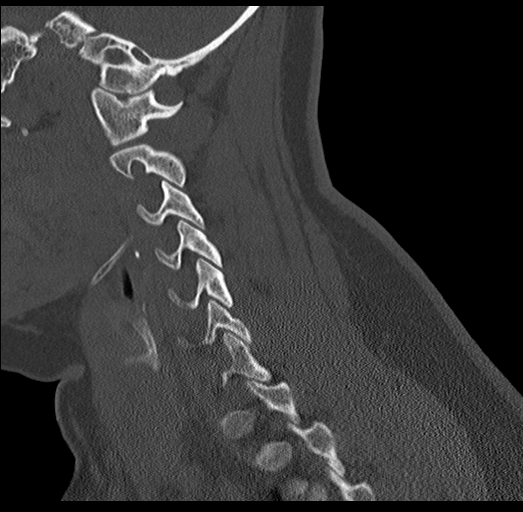

[Series 6: coronal bone · coronal · 0.26mm/px · 3 of 61 slices shown]
[im 13/61  bone]
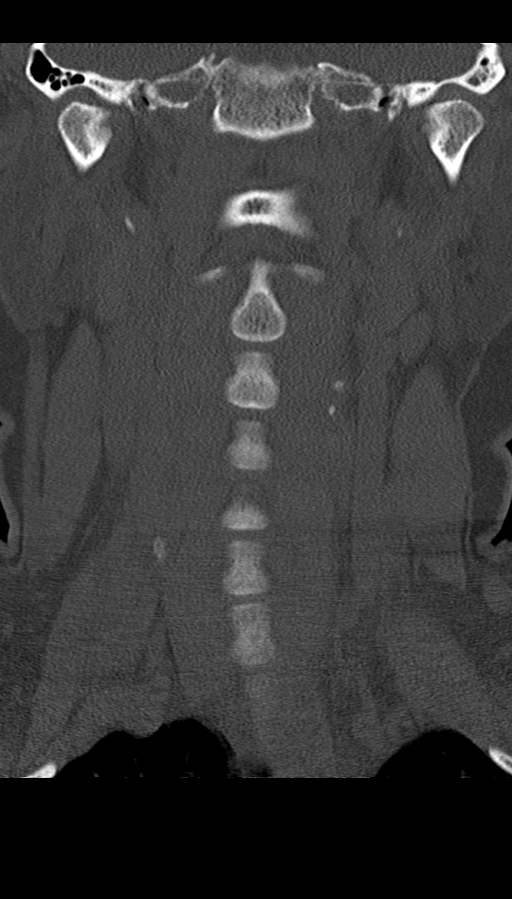
[im 25/61  bone]
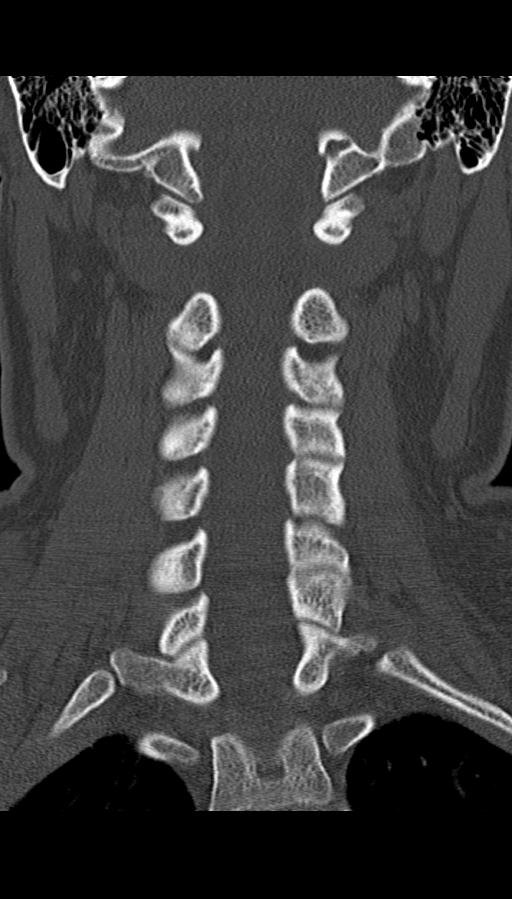
[im 37/61  bone]
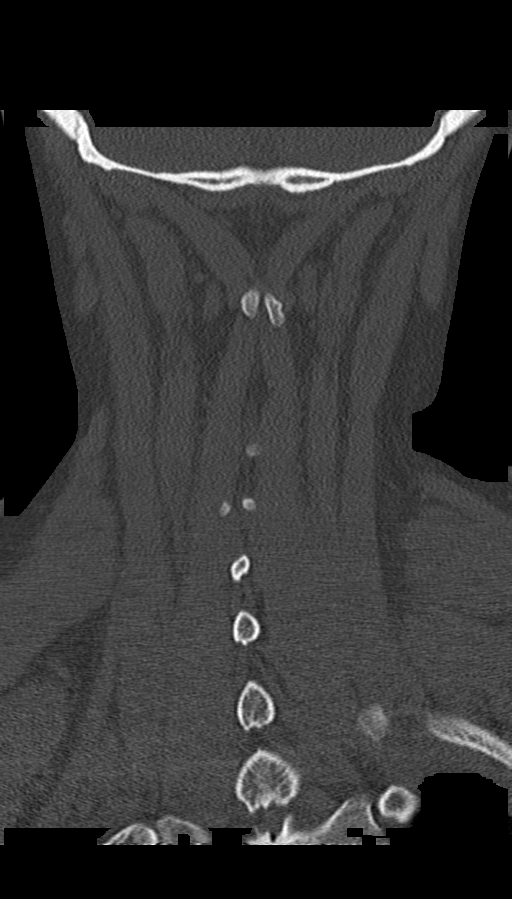

[Series 9: orthogonal axials · axial · 0.21mm/px · z∈[+153,+267]mm · 5 of 89 slices shown, 7 images]
[im 15/89  soft-tissue]
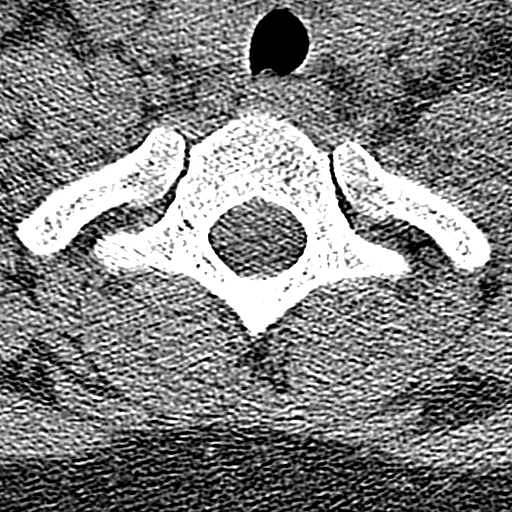
[im 15/89  bone]
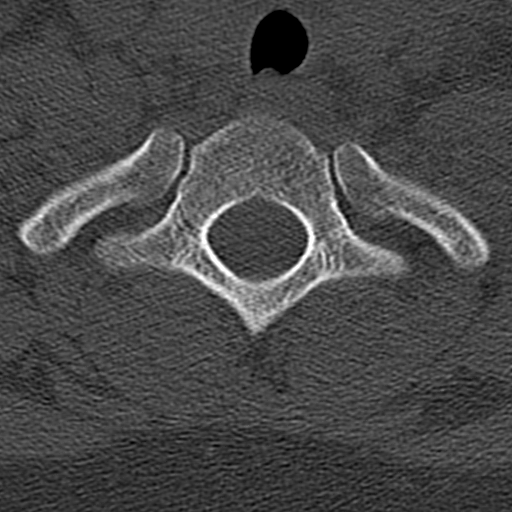
[im 30/89  bone]
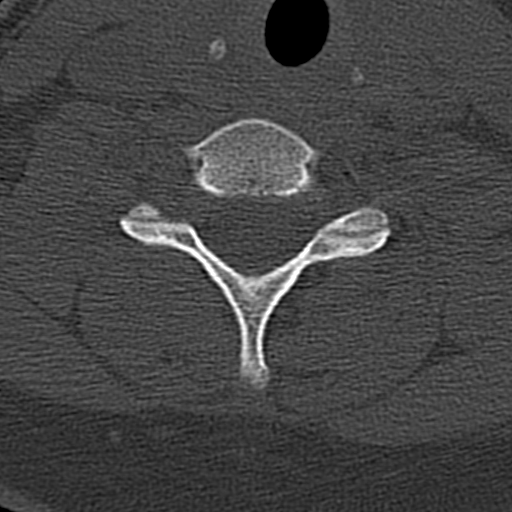
[im 45/89  bone]
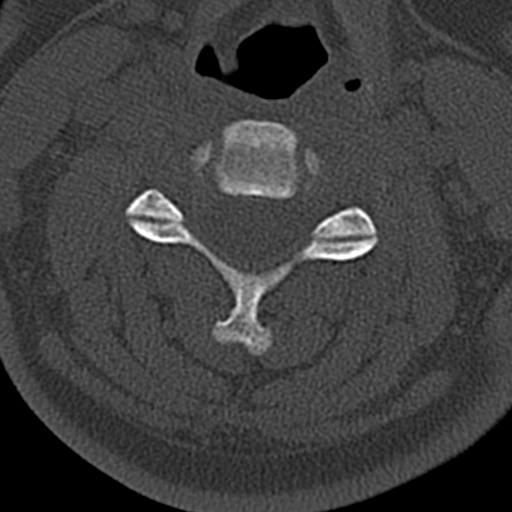
[im 59/89  bone]
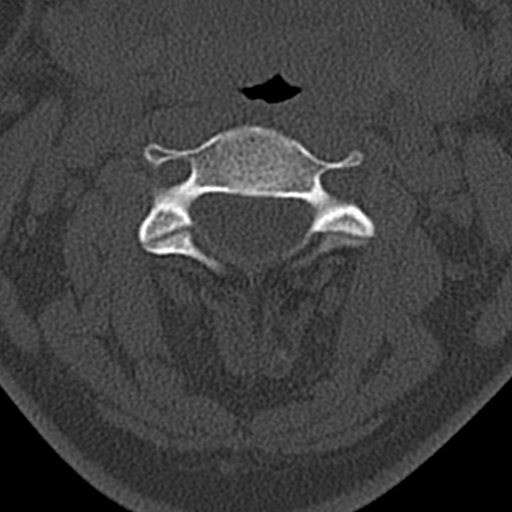
[im 74/89  soft-tissue]
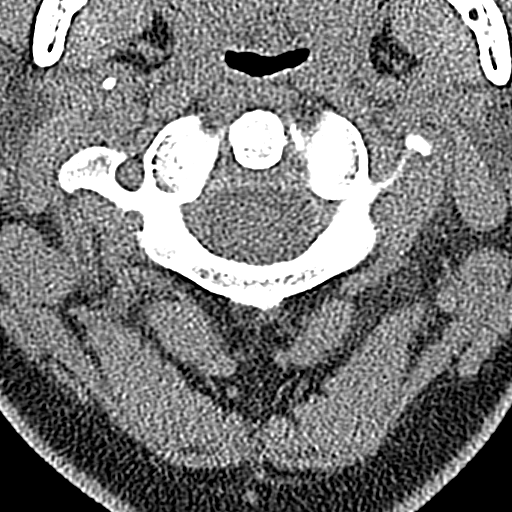
[im 74/89  bone]
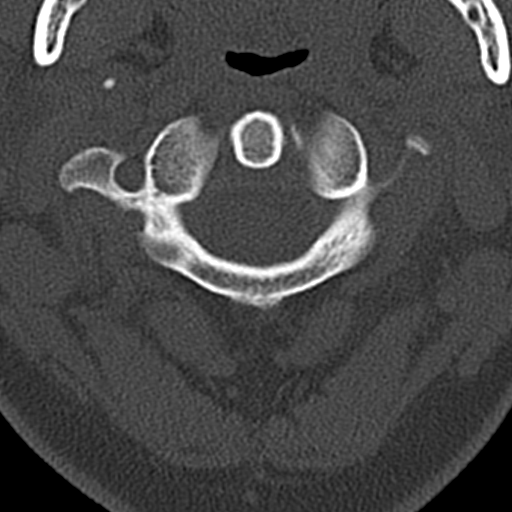

[13 of 33 positions shown; findings below may reference images not displayed]

FINDINGS: CT HEAD FINDINGS

Brain: No evidence of acute infarction, hemorrhage, hydrocephalus,
extra-axial collection or mass lesion/mass effect.

Vascular: No hyperdense vessel or unexpected calcification.

Skull: Normal. Negative for fracture or focal lesion.

Sinuses/Orbits: No acute finding.

Other: None.

CT CERVICAL SPINE FINDINGS

Alignment: Within normal limits.

Skull base and vertebrae: 7 cervical segments are well visualized.
Vertebral body height is well maintained. No anterolisthesis is
seen. No acute fracture or acute facet abnormality is noted. The
odontoid is within normal limits.

Soft tissues and spinal canal: Surrounding soft tissue structures
are unremarkable.

Upper chest: Visualized lung apices are within normal limits.

Other: None
IMPRESSION: CT of the head: No acute intracranial abnormality noted.

CT of the cervical spine: No acute abnormality noted.

## 2020-03-21 ENCOUNTER — Ambulatory Visit: Payer: 59 | Admitting: Internal Medicine

## 2020-04-07 ENCOUNTER — Telehealth: Payer: Self-pay | Admitting: Internal Medicine

## 2020-04-07 ENCOUNTER — Ambulatory Visit: Payer: 59 | Admitting: Internal Medicine

## 2020-04-07 NOTE — Telephone Encounter (Signed)
Patient no-showed today's appointment; appointment was for 05/14 at 2:30 pm, provider notified for review of record. My chart message sent for Patient to call and reschedule.

## 2020-04-10 ENCOUNTER — Telehealth: Payer: Self-pay | Admitting: Internal Medicine

## 2020-04-10 NOTE — Telephone Encounter (Signed)
Received forms from [employer] Placed in interoffice mail to [facility]

## 2020-04-12 ENCOUNTER — Ambulatory Visit: Payer: 59 | Admitting: Adult Health

## 2020-04-12 ENCOUNTER — Encounter: Payer: Self-pay | Admitting: Adult Health

## 2020-05-15 DIAGNOSIS — Z0271 Encounter for disability determination: Secondary | ICD-10-CM

## 2020-08-31 ENCOUNTER — Ambulatory Visit: Payer: 59 | Admitting: Internal Medicine

## 2020-11-08 ENCOUNTER — Encounter (HOSPITAL_COMMUNITY): Payer: Self-pay | Admitting: Emergency Medicine

## 2020-11-08 ENCOUNTER — Emergency Department (HOSPITAL_COMMUNITY)
Admission: EM | Admit: 2020-11-08 | Discharge: 2020-11-09 | Disposition: A | Discharge status: Home / Self Care | Payer: BLUE CROSS/BLUE SHIELD | Attending: Emergency Medicine | Admitting: Emergency Medicine

## 2020-11-08 ENCOUNTER — Other Ambulatory Visit: Payer: Self-pay

## 2020-11-08 DIAGNOSIS — R55 Syncope and collapse: Secondary | ICD-10-CM | POA: Insufficient documentation

## 2020-11-08 DIAGNOSIS — R0789 Other chest pain: Secondary | ICD-10-CM | POA: Insufficient documentation

## 2020-11-08 DIAGNOSIS — E876 Hypokalemia: Secondary | ICD-10-CM | POA: Diagnosis not present

## 2020-11-08 DIAGNOSIS — R112 Nausea with vomiting, unspecified: Secondary | ICD-10-CM

## 2020-11-08 LAB — POC URINE PREG, ED: Preg Test, Ur: NEGATIVE

## 2020-11-08 MED ORDER — LACTATED RINGERS IV BOLUS
1000.0000 mL | Freq: Once | INTRAVENOUS | Status: AC
  Administered 2020-11-09: 1000 mL via INTRAVENOUS

## 2020-11-08 MED ORDER — ONDANSETRON HCL 4 MG/2ML IJ SOLN
4.0000 mg | Freq: Once | INTRAMUSCULAR | Status: AC
  Administered 2020-11-09: 4 mg via INTRAVENOUS
  Filled 2020-11-08: qty 2

## 2020-11-08 NOTE — ED Triage Notes (Addendum)
Pt states she has been vomiting for the last 3 months. Pt states she has Pots Syndrome. Pt states she has frequent nosebleed.   Pt states she fainted at 1730 today.

## 2020-11-08 NOTE — ED Provider Notes (Signed)
[facility] EMERGENCY DEPARTMENT Provider Note   CSN: [id number] Arrival date & time: 11/08/20  1938   History Chief Complaint  Patient presents with  . Emesis    Leah Hartman is a 24 y.o. female.  The history is provided by the patient.  Emesis She has history of POTS, attention deficit disorder and comes in because of nausea and vomiting which have been going on for the past 10 months following an ED visit for seizure.  Symptoms initially started only after eating, but she is now vomiting even when she is not eating.  She thinks she may have had about a 10 pound weight loss.  She has restricted her diet dramatically without any improvement.  She thought symptoms might be related to reflux and tried taking omeprazole without any benefit.  She denies abdominal pain but has had some chest discomfort.  She did have a syncopal episode earlier this afternoon.  She is also complaining of intermittent nosebleeds.  Unfortunately, as she got ill, she lost her health insurance and was not able to undergo any kind of evaluation.  She has been taking ondansetron for nausea without any improvement.  Past Medical History:  Diagnosis Date  . Abdominal pain   . Dysmenorrhea 05/10/2015  . Endometriosis   . Gastritis    egd 04/07/15   . Nausea & vomiting   . Ovarian cyst   . Ovarian cyst rupture   . POTS (postural orthostatic tachycardia syndrome)   . Syncope and collapse   . Vitamin D deficiency     Patient Active Problem List   Diagnosis Date Noted  . Obesity (BMI 30-39.9) 12/21/2019  . Abnormal MRI, lumbar spine 12/21/2019  . Lumbar radiculopathy 12/21/2019  . IUD check up 03/15/2019  . Vitamin D deficiency 12/22/2018  . Sinusitis, acute frontal 11/21/2018  . Interstitial cystitis 06/18/2018  . ADHD 03/19/2018  . Mood swings 03/19/2018  . Dyslexia 03/19/2018  . POTS (postural orthostatic tachycardia syndrome) 03/19/2018  . Viral illness 01/02/2018  . Endometriosis 07/19/2015  .  Personal history of perinatal problems 07/04/2015  . Chronic diarrhea 05/26/2015  . Chronic pelvic pain in female 05/10/2015  . Family history of endometriosis in first degree relative 05/10/2015  . Dysmenorrhea 05/10/2015  . Menorrhagia 05/10/2015  . Syncope 01/31/2015  . Chronic fatigue 01/31/2015  . SOB (shortness of breath) 09/27/2014    Past Surgical History:  Procedure Laterality Date  . APPENDECTOMY    . ESOPHAGOGASTRODUODENOSCOPY N/A 04/07/2015   Procedure: ESOPHAGOGASTRODUODENOSCOPY (EGD);  Surgeon: Midge Minium, MD;  Location: [facility];  Service: Gastroenterology;  Laterality: N/A;  . INTRAUTERINE DEVICE (IUD) INSERTION N/A 05/15/2015   Procedure: INTRAUTERINE DEVICE (IUD) INSERTION;  Surgeon: Herold Harms, MD;  Location: [facility];  Service: Gynecology;  Laterality: N/A;  . LAPAROSCOPY N/A 05/15/2015   Procedure: with excision and fulgeration of endometriosis;  Surgeon: Herold Harms, MD;  Location: [facility];  Service: Gynecology;  Laterality: N/A;  with excision and fulgeration of endometriosis  . OVARIAN CYST SURGERY Bilateral   . TONSILLECTOMY AND ADENOIDECTOMY    . TYMPANOSTOMY TUBE PLACEMENT       OB History   No obstetric history on file.     Family History  Problem Relation Age of Onset  . Hypertension Mother   . Diabetes Father   . Learning disabilities Father   . Birth defects Maternal Grandmother   . Birth defects Maternal Grandfather   . Birth defects Paternal Grandmother   . Birth  defects Paternal Grandfather   . Colon cancer Neg Hx   . Ovarian cancer Neg Hx   . Breast cancer Neg Hx     Social History   Tobacco Use  . Smoking status: Never Smoker  . Smokeless tobacco: Never Used  Vaping Use  . Vaping Use: Some days  Substance Use Topics  . Alcohol use: No    Alcohol/week: 0.0 standard drinks  . Drug use: No    Home Medications Prior to Admission medications   Medication Sig Start Date End Date Taking? Authorizing  Provider  Levonorgestrel (LILETTA, 52 MG,) 19.5 MCG/DAY IUD IUD by Intrauterine route.    [provider]    Allergies    Patient has no known allergies.  Review of Systems   Review of Systems  Gastrointestinal: Positive for vomiting.  All other systems reviewed and are negative.   Physical Exam Updated Vital Signs BP 127/85 (BP Location: Right Arm)   Pulse 78   Temp 98.1 F (36.7 C) (Oral)   Resp 18   Ht 5\' 5"  (1.651 m)   Wt 99.8 kg   SpO2 100%   BMI 36.61 kg/m   Physical Exam Vitals and nursing note reviewed.   24 year old female, resting comfortably and in no acute distress. Vital signs are noprmal. Oxygen saturation is 100%, which is normal. Head is normocephalic and atraumatic. PERRLA, EOMI. Oropharynx is clear.  Mucous membranes are moist.  Examination of the nasal cavities is normal. Neck is nontender and supple without adenopathy or JVD. Back is nontender and there is no CVA tenderness. Lungs are clear without rales, wheezes, or rhonchi. Chest is nontender. Heart has regular rate and rhythm without murmur. Abdomen is soft, flat, nontender without masses or hepatosplenomegaly and peristalsis is hypoactive. Extremities have no cyanosis or edema, full range of motion is present. Skin is warm and dry without rash. Neurologic: Mental status is normal, cranial nerves are intact, there are no motor or sensory deficits.  ED Results / Procedures / Treatments   Labs (all labs ordered are listed, but only abnormal results are displayed) Labs Reviewed  COMPREHENSIVE METABOLIC PANEL - Abnormal; Notable for the following components:      Result Value   Potassium 3.4 (*)    All other components within normal limits  URINALYSIS, ROUTINE W REFLEX MICROSCOPIC - Abnormal; Notable for the following components:   APPearance CLOUDY (*)    Hgb urine dipstick SMALL (*)    Ketones, ur 20 (*)    Leukocytes,Ua MODERATE (*)    Bacteria, UA RARE (*)    All other components  within normal limits  CBC WITH DIFFERENTIAL/PLATELET - Abnormal; Notable for the following components:   WBC 11.4 (*)    All other components within normal limits  LIPASE, BLOOD  POC URINE PREG, ED    EKG EKG Interpretation  Date/Time:  Wednesday November 08 2020 20:19:35 EST Ventricular Rate:  83 PR Interval:  118 QRS Duration: 88 QT Interval:  380 QTC Calculation: 446 R Axis:   66 Text Interpretation: Normal sinus rhythm with sinus arrhythmia Normal ECG When compared with ECG of 01/05/2020, No significant change was found Confirmed by 03/04/2020 (Dione Booze) on 11/08/2020 11:15:58 PM  Procedures Procedures   Medications Ordered in ED Medications  lactated ringers bolus 1,000 mL (0 mLs Intravenous Stopped 11/09/20 0143)  ondansetron (ZOFRAN) injection 4 mg (4 mg Intravenous Given 11/09/20 0038)  prochlorperazine (COMPAZINE) injection 10 mg (10 mg Intravenous Given 11/09/20 0143)  ED Course  I have reviewed the triage vital signs and the nursing notes.  Pertinent labs & imaging results that were available during my care of the patient were reviewed by me and considered in my medical decision making (see chart for details).  MDM Rules/Calculators/A&P Persistent nausea and vomiting.  I doubt history is completely accurate as vomiting to the extent patient describes should have caused more than a 10 pound weight loss over 71months.  In spite of weight loss, she is still obese.  ECG shows no acute changes.  Will check screening labs and give IV fluids and IV ondansetron.  Old records are reviewed, confirming ED visit for seizure, cardiology evaluation for dysautonomia.  1:31 AM Labs show mild hypokalemia.  Patient did vomit after getting IV fluids and ondansetron.  Will try IV prochlorperazine.  She feels much better after above-noted treatment.  She is anxious to be discharged.  She is discharged with prescription for prochlorperazine and referred to gastroenterology for further  outpatient work-up.  Final Clinical Impression(s) / ED Diagnoses Final diagnoses:  Non-intractable vomiting with nausea, unspecified vomiting type  Hypokalemia    Rx / DC Orders ED Discharge Orders         Ordered    prochlorperazine (COMPAZINE) 10 MG tablet  Every 6 hours PRN        11/09/20 0221           Dione Booze, MD 11/09/20 [phone]

## 2020-11-09 LAB — URINALYSIS, ROUTINE W REFLEX MICROSCOPIC
Bilirubin Urine: NEGATIVE
Glucose, UA: NEGATIVE mg/dL
Ketones, ur: 20 mg/dL — AB
Nitrite: NEGATIVE
Protein, ur: NEGATIVE mg/dL
Specific Gravity, Urine: 1.028 (ref 1.005–1.030)
pH: 5 (ref 5.0–8.0)

## 2020-11-09 LAB — COMPREHENSIVE METABOLIC PANEL
ALT: 20 U/L (ref 0–44)
AST: 22 U/L (ref 15–41)
Albumin: 4.3 g/dL (ref 3.5–5.0)
Alkaline Phosphatase: 58 U/L (ref 38–126)
Anion gap: 10 (ref 5–15)
BUN: 13 mg/dL (ref 6–20)
CO2: 24 mmol/L (ref 22–32)
Calcium: 9.1 mg/dL (ref 8.9–10.3)
Chloride: 103 mmol/L (ref 98–111)
Creatinine, Ser: 0.64 mg/dL (ref 0.44–1.00)
GFR, Estimated: >60 mL/min (ref >60)
Glucose, Bld: 87 mg/dL (ref 70–99)
Potassium: 3.4 mmol/L — ABNORMAL LOW (ref 3.5–5.1)
Sodium: 137 mmol/L (ref 135–145)
Total Bilirubin: 0.7 mg/dL (ref 0.3–1.2)
Total Protein: 7.5 g/dL (ref 6.5–8.1)

## 2020-11-09 LAB — CBC WITH DIFFERENTIAL/PLATELET
Abs Immature Granulocytes: 0.06 10*3/uL (ref 0.00–0.07)
Basophils Absolute: 0.1 10*3/uL (ref 0.0–0.1)
Basophils Relative: 0 %
Eosinophils Absolute: 0.1 10*3/uL (ref 0.0–0.5)
Eosinophils Relative: 1 %
HCT: 40.6 % (ref 36.0–46.0)
Hemoglobin: 13 g/dL (ref 12.0–15.0)
Immature Granulocytes: 1 %
Lymphocytes Relative: 32 %
Lymphs Abs: 3.6 10*3/uL (ref 0.7–4.0)
MCH: 29.7 pg (ref 26.0–34.0)
MCHC: 32 g/dL (ref 30.0–36.0)
MCV: 92.9 fL (ref 80.0–100.0)
Monocytes Absolute: 0.8 10*3/uL (ref 0.1–1.0)
Monocytes Relative: 7 %
Neutro Abs: 6.8 10*3/uL (ref 1.7–7.7)
Neutrophils Relative %: 59 %
Platelets: 282 10*3/uL (ref 150–400)
RBC: 4.37 MIL/uL (ref 3.87–5.11)
RDW: 14.2 % (ref 11.5–15.5)
WBC: 11.4 10*3/uL — ABNORMAL HIGH (ref 4.0–10.5)
nRBC: 0 % (ref 0.0–0.2)

## 2020-11-09 LAB — LIPASE, BLOOD: Lipase: 29 U/L (ref 11–51)

## 2020-11-09 MED ORDER — PROCHLORPERAZINE EDISYLATE 10 MG/2ML IJ SOLN
10.0000 mg | Freq: Once | INTRAMUSCULAR | Status: AC
  Administered 2020-11-09: 10 mg via INTRAVENOUS
  Filled 2020-11-09: qty 2

## 2020-11-09 MED ORDER — PROCHLORPERAZINE MALEATE 10 MG PO TABS: 10.0000 mg | ORAL_TABLET | Freq: Four times a day (QID) | ORAL | 0 refills | Status: DC | PRN

## 2020-11-12 DIAGNOSIS — Z6834 Body mass index (BMI) 34.0-34.9, adult: Secondary | ICD-10-CM | POA: Diagnosis not present

## 2020-11-12 DIAGNOSIS — E876 Hypokalemia: Secondary | ICD-10-CM | POA: Diagnosis not present

## 2020-11-12 DIAGNOSIS — R1010 Upper abdominal pain, unspecified: Secondary | ICD-10-CM | POA: Diagnosis not present

## 2020-11-12 DIAGNOSIS — R5383 Other fatigue: Secondary | ICD-10-CM | POA: Diagnosis not present

## 2020-11-12 DIAGNOSIS — Z20822 Contact with and (suspected) exposure to covid-19: Secondary | ICD-10-CM | POA: Diagnosis not present

## 2020-11-12 DIAGNOSIS — R1084 Generalized abdominal pain: Secondary | ICD-10-CM | POA: Diagnosis not present

## 2020-11-12 DIAGNOSIS — N809 Endometriosis, unspecified: Secondary | ICD-10-CM | POA: Diagnosis not present

## 2020-11-12 DIAGNOSIS — I499 Cardiac arrhythmia, unspecified: Secondary | ICD-10-CM | POA: Diagnosis not present

## 2020-11-12 DIAGNOSIS — Z79899 Other long term (current) drug therapy: Secondary | ICD-10-CM | POA: Diagnosis not present

## 2020-11-12 DIAGNOSIS — R634 Abnormal weight loss: Secondary | ICD-10-CM | POA: Diagnosis not present

## 2020-11-12 DIAGNOSIS — I82409 Acute embolism and thrombosis of unspecified deep veins of unspecified lower extremity: Secondary | ICD-10-CM | POA: Diagnosis not present

## 2020-11-12 DIAGNOSIS — U070 Vaping-related disorder: Secondary | ICD-10-CM | POA: Diagnosis not present

## 2020-11-12 DIAGNOSIS — R252 Cramp and spasm: Secondary | ICD-10-CM | POA: Diagnosis not present

## 2020-11-12 DIAGNOSIS — R112 Nausea with vomiting, unspecified: Secondary | ICD-10-CM | POA: Diagnosis not present

## 2020-11-12 DIAGNOSIS — R079 Chest pain, unspecified: Secondary | ICD-10-CM | POA: Diagnosis not present

## 2020-11-12 DIAGNOSIS — F17290 Nicotine dependence, other tobacco product, uncomplicated: Secondary | ICD-10-CM | POA: Diagnosis not present

## 2020-11-12 DIAGNOSIS — R10816 Epigastric abdominal tenderness: Secondary | ICD-10-CM | POA: Diagnosis not present

## 2020-11-12 DIAGNOSIS — R1013 Epigastric pain: Secondary | ICD-10-CM | POA: Diagnosis not present

## 2020-11-12 DIAGNOSIS — Z7901 Long term (current) use of anticoagulants: Secondary | ICD-10-CM | POA: Diagnosis not present

## 2020-11-12 DIAGNOSIS — R300 Dysuria: Secondary | ICD-10-CM | POA: Diagnosis not present

## 2020-11-12 DIAGNOSIS — I471 Supraventricular tachycardia: Secondary | ICD-10-CM | POA: Diagnosis not present

## 2020-11-12 DIAGNOSIS — I498 Other specified cardiac arrhythmias: Secondary | ICD-10-CM | POA: Diagnosis not present

## 2020-11-13 DIAGNOSIS — I498 Other specified cardiac arrhythmias: Secondary | ICD-10-CM | POA: Diagnosis not present

## 2020-11-13 DIAGNOSIS — R112 Nausea with vomiting, unspecified: Secondary | ICD-10-CM | POA: Diagnosis not present

## 2020-11-13 DIAGNOSIS — F17290 Nicotine dependence, other tobacco product, uncomplicated: Secondary | ICD-10-CM | POA: Diagnosis not present

## 2020-11-14 DIAGNOSIS — I498 Other specified cardiac arrhythmias: Secondary | ICD-10-CM | POA: Diagnosis not present

## 2020-11-14 DIAGNOSIS — R112 Nausea with vomiting, unspecified: Secondary | ICD-10-CM | POA: Diagnosis not present

## 2020-11-14 DIAGNOSIS — F17290 Nicotine dependence, other tobacco product, uncomplicated: Secondary | ICD-10-CM | POA: Diagnosis not present

## 2020-11-18 ENCOUNTER — Other Ambulatory Visit: Payer: Self-pay

## 2020-11-18 ENCOUNTER — Emergency Department (HOSPITAL_COMMUNITY): Payer: BLUE CROSS/BLUE SHIELD

## 2020-11-18 ENCOUNTER — Encounter (HOSPITAL_COMMUNITY): Payer: Self-pay | Admitting: *Deleted

## 2020-11-18 ENCOUNTER — Emergency Department (HOSPITAL_COMMUNITY)
Admission: EM | Admit: 2020-11-18 | Discharge: 2020-11-18 | Disposition: A | Discharge status: Home / Self Care | Payer: BLUE CROSS/BLUE SHIELD | Attending: Emergency Medicine | Admitting: Emergency Medicine

## 2020-11-18 DIAGNOSIS — R55 Syncope and collapse: Secondary | ICD-10-CM | POA: Diagnosis not present

## 2020-11-18 DIAGNOSIS — Z23 Encounter for immunization: Secondary | ICD-10-CM | POA: Insufficient documentation

## 2020-11-18 DIAGNOSIS — M25571 Pain in right ankle and joints of right foot: Secondary | ICD-10-CM | POA: Insufficient documentation

## 2020-11-18 DIAGNOSIS — Y9339 Activity, other involving climbing, rappelling and jumping off: Secondary | ICD-10-CM | POA: Diagnosis not present

## 2020-11-18 DIAGNOSIS — Y30XXXA Falling, jumping or pushed from a high place, undetermined intent, initial encounter: Secondary | ICD-10-CM | POA: Insufficient documentation

## 2020-11-18 DIAGNOSIS — S069X9A Unspecified intracranial injury with loss of consciousness of unspecified duration, initial encounter: Secondary | ICD-10-CM | POA: Diagnosis not present

## 2020-11-18 DIAGNOSIS — M7989 Other specified soft tissue disorders: Secondary | ICD-10-CM | POA: Diagnosis not present

## 2020-11-18 DIAGNOSIS — S80212A Abrasion, left knee, initial encounter: Secondary | ICD-10-CM | POA: Diagnosis not present

## 2020-11-18 MED ORDER — TETANUS-DIPHTH-ACELL PERTUSSIS 5-2.5-18.5 LF-MCG/0.5 IM SUSY
0.5000 mL | PREFILLED_SYRINGE | Freq: Once | INTRAMUSCULAR | Status: AC
  Administered 2020-11-18: 0.5 mL via INTRAMUSCULAR
  Filled 2020-11-18: qty 0.5

## 2020-11-18 NOTE — ED Provider Notes (Signed)
[facility] EMERGENCY DEPARTMENT Provider Note   CSN: [id number] Arrival date & time: 11/18/20  [id number]     History Chief Complaint  Patient presents with   Ankle Pain    Leah Hartman is a 24 y.o. female with past medical history significant for POTS who presents for evaluation of fall.  States she felt lightheaded yesterday when she jumped up quickly.  Apparently went to reach for something to hold onto and subsequently fell.  She denies any syncope. Initially she told triage she had LOC however patient states she did not.  States she did hit the back of her head when she fell. She has abrasion to her left knee.  States she inverted her right ankle.  Has had pain and swelling to this since the incident.  Not been able to walk due to right ankle pain.  Denies preceding sudden onset thunderclap headache, neck pain, paresthesias, chest pain, shortness of breath abdominal pain.  Denies additional aggravating or alleviating factors  History obtained from patient and past medical records.  No interpreter used  HPI     Past Medical History:  Diagnosis Date   Abdominal pain    Dysmenorrhea 05/10/2015   Endometriosis    Gastritis    egd 04/07/15    Nausea & vomiting    Ovarian cyst    Ovarian cyst rupture    POTS (postural orthostatic tachycardia syndrome)    Syncope and collapse    Vitamin D deficiency     Patient Active Problem List   Diagnosis Date Noted   Obesity (BMI 30-39.9) 12/21/2019   Abnormal MRI, lumbar spine 12/21/2019   Lumbar radiculopathy 12/21/2019   IUD check up 03/15/2019   Vitamin D deficiency 12/22/2018   Sinusitis, acute frontal 11/21/2018   Interstitial cystitis 06/18/2018   ADHD 03/19/2018   Mood swings 03/19/2018   Dyslexia 03/19/2018   POTS (postural orthostatic tachycardia syndrome) 03/19/2018   Viral illness 01/02/2018   Endometriosis 07/19/2015   Personal history of perinatal problems 07/04/2015   Chronic diarrhea  05/26/2015   Chronic pelvic pain in female 05/10/2015   Family history of endometriosis in first degree relative 05/10/2015   Dysmenorrhea 05/10/2015   Menorrhagia 05/10/2015   Syncope 01/31/2015   Chronic fatigue 01/31/2015   SOB (shortness of breath) 09/27/2014    Past Surgical History:  Procedure Laterality Date   APPENDECTOMY     ESOPHAGOGASTRODUODENOSCOPY N/A 04/07/2015   Procedure: ESOPHAGOGASTRODUODENOSCOPY (EGD);  Surgeon: Midge Miniumarren Wohl, MD;  [facility];  Service: Gastroenterology;  Laterality: N/A;   INTRAUTERINE DEVICE (IUD) INSERTION N/A 05/15/2015   Procedure: INTRAUTERINE DEVICE (IUD) INSERTION;  Surgeon: Herold HarmsMartin A Defrancesco, MD;  Location: [facility];  Service: Gynecology;  Laterality: N/A;   LAPAROSCOPY N/A 05/15/2015   Procedure: with excision and fulgeration of endometriosis;  Surgeon: Herold HarmsMartin A Defrancesco, MD;  Location: [facility];  Service: Gynecology;  Laterality: N/A;  with excision and fulgeration of endometriosis   OVARIAN CYST SURGERY Bilateral    TONSILLECTOMY AND ADENOIDECTOMY     TYMPANOSTOMY TUBE PLACEMENT       OB History   No obstetric history on file.     Family History  Problem Relation Age of Onset   Hypertension Mother    Diabetes Father    Learning disabilities Father    Birth defects Maternal Grandmother    Birth defects Maternal Grandfather    Birth defects Paternal Grandmother    Birth defects Paternal Grandfather    Colon cancer Neg Hx  Ovarian cancer Neg Hx    Breast cancer Neg Hx     Social History   Tobacco Use   Smoking status: Never Smoker   Smokeless tobacco: Never Used  Vaping Use   Vaping Use: Some days  Substance Use Topics   Alcohol use: No    Alcohol/week: 0.0 standard drinks   Drug use: No    Home Medications Prior to Admission medications   Medication Sig Start Date End Date Taking? Authorizing Provider  Levonorgestrel (LILETTA, 52 MG,) 19.5 MCG/DAY IUD IUD by  Intrauterine route.    [provider]  prochlorperazine (COMPAZINE) 10 MG tablet Take 1 tablet (10 mg total) by mouth every 6 (six) hours as needed for nausea or vomiting. 11/09/20   Dione Booze, MD    Allergies    Patient has no known allergies.  Review of Systems   Review of Systems  Constitutional: Negative.   HENT: Negative.   Respiratory: Negative.   Cardiovascular: Negative.   Gastrointestinal: Negative.   Genitourinary: Negative.   Musculoskeletal: Positive for gait problem.       Left knee abrasion, left ankle pain and swelling  Skin: Negative.   Neurological: Negative for dizziness, tremors, seizures, syncope, facial asymmetry, speech difficulty, weakness, numbness and headaches.  All other systems reviewed and are negative.   Physical Exam Updated Vital Signs BP 122/86 (BP Location: Left Arm)    Pulse (!) 112    Temp 98.3 F (36.8 C) (Oral)    Resp 16    Ht 5\' 9"  (1.753 m)    Wt 99.8 kg    SpO2 99%    BMI 32.49 kg/m   Physical Exam Physical Exam  Constitutional: Pt is oriented to person, place, and time. Pt appears well-developed and well-nourished. No distress.  HENT:  Head: Normocephalic and atraumatic.  Mouth/Throat: Oropharynx is clear and moist.  Eyes: Conjunctivae and EOM are normal. Pupils are equal, round, and reactive to light. No scleral icterus.  No horizontal, vertical or rotational nystagmus  Neck: Normal range of motion. Neck supple.  Full active and passive ROM without pain No midline or paraspinal tenderness No nuchal rigidity or meningeal signs  Cardiovascular: Normal rate, regular rhythm and intact distal pulses.   Pulmonary/Chest: Effort normal and breath sounds normal. No respiratory distress. Pt has no wheezes. No rales.  Abdominal: Soft. Bowel sounds are normal. There is no tenderness. There is no rebound and no guarding.  Musculoskeletal: Normal range of motion.  Moves all 4 extremities. Decreased range of motion to right ankle  with inversion.  Has some soft tissue swelling about her lateral malleolus on the right.  No bony tenderness to foot.  No tenderness to midshaft, proximal tib-fib.  Pelvis stable, nontender palpation Able to flex and extend at bilateral knees without difficulty. Lymphadenopathy:    No cervical adenopathy.  Neurological: Pt. is alert and oriented to person, place, and time. He has normal reflexes. No cranial nerve deficit.  Exhibits normal muscle tone. Coordination normal.  Mental Status:  Alert, oriented, thought content appropriate. Speech fluent without evidence of aphasia. Able to follow 2 step commands without difficulty.  Cranial Nerves:  II:  Peripheral visual fields grossly normal, pupils equal, round, reactive to light III,IV, VI: ptosis not present, extra-ocular motions intact bilaterally  V,VII: smile symmetric, facial light touch sensation equal VIII: hearing grossly normal bilaterally  IX,X: midline uvula rise  XI: bilateral shoulder shrug equal and strong XII: midline tongue extension  Motor:  5/5 in upper  and lower extremities bilaterally including strong and equal grip strength and dorsiflexion/plantar flexion Sensory: Pinprick and light touch normal in all extremities.  Deep Tendon Reflexes: 2+ and symmetric  Cerebellar: normal finger-to-nose with bilateral upper extremities Gait: normal gait and balance CV: distal pulses palpable throughout   Skin: Skin is warm and dry.  Region to left knee.  No underlying tenderness Psychiatric: Pt has a normal mood and affect. Behavior is normal. Judgment and thought content normal.  Nursing note and vitals reviewed. ED Results / Procedures / Treatments   Labs (all labs ordered are listed, but only abnormal results are displayed) Labs Reviewed - No data to display  EKG None  Radiology DG Ankle Complete Right  Result Date: 11/18/2020 CLINICAL DATA:  24 year old female with fall and trauma to the right ankle. EXAM: RIGHT ANKLE -  COMPLETE 3+ VIEW COMPARISON:  None. FINDINGS: There is no acute fracture or dislocation. The bones are well mineralized. No arthritic changes. The ankle mortise is intact. Mild soft tissue swelling over the lateral malleolus. IMPRESSION: Negative. Electronically Signed   By: Elgie Collard M.D.   On: 11/18/2020 21:27   CT Head Wo Contrast  Result Date: 11/18/2020 CLINICAL DATA:  Syncope, hit head on gravel.  Loss of consciousness. EXAM: CT HEAD WITHOUT CONTRAST TECHNIQUE: Contiguous axial images were obtained from the base of the skull through the vertex without intravenous contrast. COMPARISON:  CT head 01/05/2020 FINDINGS: Brain: No evidence of large-territorial acute infarction. No parenchymal hemorrhage. No mass lesion. No extra-axial collection. No mass effect or midline shift. No hydrocephalus. Basilar cisterns are patent. Vascular: No hyperdense vessel. Skull: No acute fracture or focal lesion. Sinuses/Orbits: Paranasal sinuses and mastoid air cells are clear. The orbits are unremarkable. Other: None. IMPRESSION: No acute intracranial abnormality. Electronically Signed   By: Tish Frederickson M.D.   On: 11/18/2020 21:32    Procedures .Splint Application  Date/Time: 11/18/2020 10:41 PM Performed by: Ralph Leyden A, PA-C Authorized by: Linwood Dibbles, PA-C   Consent:    Consent obtained:  Verbal   Consent given by:  Patient   Risks, benefits, and alternatives were discussed: yes     Risks discussed:  Discoloration, numbness, pain and swelling   Alternatives discussed:  Referral, observation, no treatment, delayed treatment and alternative treatment Universal protocol:    Procedure explained and questions answered to patient or proxy's satisfaction: yes     Relevant documents present and verified: yes     Test results available: yes     Imaging studies available: yes     Required blood products, implants, devices, and special equipment available: yes     Site/side marked: yes      Immediately prior to procedure a time out was called: yes     Patient identity confirmed:  Verbally with patient Pre-procedure details:    Distal neurologic exam:  Normal   Distal perfusion: distal pulses strong and brisk capillary refill   Procedure details:    Location:  Ankle   Ankle location:  R ankle   Strapping: no     Cast type:  Short leg   Splint type:  Short leg and ankle stirrup   Attestation: Splint applied and adjusted personally by me   Post-procedure details:    Distal neurologic exam:  Normal   Distal perfusion: distal pulses strong and brisk capillary refill     Procedure completion:  Tolerated well, no immediate complications   Post-procedure imaging: not applicable     (including  critical care time)  Medications Ordered in ED Medications  Tdap (BOOSTRIX) injection 0.5 mL (0.5 mLs Intramuscular Given 11/18/20 2238)    ED Course  I have reviewed the triage vital signs and the nursing notes.  Pertinent labs & imaging results that were available during my care of the patient were reviewed by me and considered in my medical decision making (see chart for details).  24 year old history of pots with frequent lightheaded spells with position changes who presents for evaluation of fall and subsequent right ankle pain.  Initially per triage note there is some concern for syncope or LOC.  Patient denies this.  States she stood up, went to reach for something and subsequently fell.  No preceding sudden onset thunderclap headache, paresthesias, weakness, chest pain or shortness of breath prior to fall.  No seizure-like activity.  She did hit the posterior aspect of her head.  Unsure last tetanus.  She has a nonfocal neuro exam without deficits.  Her heart and lungs are clear.  Her abdomen is soft, nontender.  We will plan on updating her tetanus.  Does have some moderate soft tissue swelling to right ankle and pain with inversion.  No bony tenderness of bilateral feet, knees,  midshaft, proximal tib-fib.  Does have abrasion to left knee however is full range of motion without difficulty with no underlying pain.  Wound was cleaned.  Labs, imaging and reassess  CT head without significant findings Right ankle film without fracture, dislocation.  Have some mild soft tissue swelling over right lateral malleolus.  Patient has stated she does not want labs.  I did discuss with patient cannot rule out possible electrolyte abnormality, additional cause of possible fall.  She understands this.  States she just had labs done 2 days ago and they were "completely normal."  She does not want labs done today and understands the risk and benefit.  Her mother is here in room as well and understands this.  Patient placed in ASO brace, crutches.  Discussed Tylenol, ice for pain.  Unfortunately cannot take Motrin due to patient having an endoscopy within the week and cannot take NSAIDs.  Discussed close follow-up with PCP or orthopedics if her symptoms are unresolved.  She is agreeable to this  The patient has been appropriately medically screened and/or stabilized in the ED. I have low suspicion for any other emergent medical condition which would require further screening, evaluation or treatment in the ED or require inpatient management.  Patient is hemodynamically stable and in no acute distress.  Patient able to ambulate in department prior to ED.  Evaluation does not show acute pathology that would require ongoing or additional emergent interventions while in the emergency department or further inpatient treatment.  I have discussed the diagnosis with the patient and answered all questions.  Pain is been managed while in the emergency department and patient has no further complaints prior to discharge.  Patient is comfortable with plan discussed in room and is stable for discharge at this time.  I have discussed strict return precautions for returning to the emergency department.   Patient was encouraged to follow-up with PCP/specialist refer to at discharge.    MDM Rules/Calculators/A&P                           Final Clinical Impression(s) / ED Diagnoses Final diagnoses:  Acute right ankle pain    Rx / DC Orders ED Discharge Orders  None       Jemila Camille A, PA-C 11/18/20 2243    Sabas Sous, MD 11/18/20 [phone]

## 2020-11-18 NOTE — Discharge Instructions (Signed)
Follow-up with orthopedics if her symptoms are unresolved.  Try to ice and elevate your leg.  Return for any worsening symptoms.

## 2020-11-18 NOTE — ED Triage Notes (Signed)
Pt with right ankle pain since fall last night, not able to put weight on right ankle.  Abrasion to left knee.  Pt states she hit her head on gravel with LOC.  Pt states she has POTS.

## 2020-11-23 DIAGNOSIS — K219 Gastro-esophageal reflux disease without esophagitis: Secondary | ICD-10-CM | POA: Diagnosis not present

## 2020-11-23 DIAGNOSIS — K200 Eosinophilic esophagitis: Secondary | ICD-10-CM | POA: Diagnosis not present

## 2020-11-23 DIAGNOSIS — Z6833 Body mass index (BMI) 33.0-33.9, adult: Secondary | ICD-10-CM | POA: Diagnosis not present

## 2020-11-23 DIAGNOSIS — R112 Nausea with vomiting, unspecified: Secondary | ICD-10-CM | POA: Diagnosis not present

## 2020-11-23 DIAGNOSIS — E669 Obesity, unspecified: Secondary | ICD-10-CM | POA: Diagnosis not present

## 2020-11-23 DIAGNOSIS — R1110 Vomiting, unspecified: Secondary | ICD-10-CM | POA: Diagnosis not present

## 2020-11-23 DIAGNOSIS — K259 Gastric ulcer, unspecified as acute or chronic, without hemorrhage or perforation: Secondary | ICD-10-CM | POA: Diagnosis not present

## 2020-11-23 DIAGNOSIS — Z9049 Acquired absence of other specified parts of digestive tract: Secondary | ICD-10-CM | POA: Diagnosis not present

## 2020-11-23 DIAGNOSIS — K2950 Unspecified chronic gastritis without bleeding: Secondary | ICD-10-CM | POA: Diagnosis not present

## 2020-11-23 DIAGNOSIS — Z79899 Other long term (current) drug therapy: Secondary | ICD-10-CM | POA: Diagnosis not present

## 2020-11-23 DIAGNOSIS — I498 Other specified cardiac arrhythmias: Secondary | ICD-10-CM | POA: Diagnosis not present

## 2020-11-23 DIAGNOSIS — K3189 Other diseases of stomach and duodenum: Secondary | ICD-10-CM | POA: Diagnosis not present

## 2020-11-23 DIAGNOSIS — R12 Heartburn: Secondary | ICD-10-CM | POA: Diagnosis not present

## 2020-11-24 DIAGNOSIS — R112 Nausea with vomiting, unspecified: Secondary | ICD-10-CM | POA: Diagnosis not present

## 2020-11-24 DIAGNOSIS — R0789 Other chest pain: Secondary | ICD-10-CM | POA: Diagnosis not present

## 2020-11-24 DIAGNOSIS — R1084 Generalized abdominal pain: Secondary | ICD-10-CM | POA: Diagnosis not present

## 2020-11-24 DIAGNOSIS — R109 Unspecified abdominal pain: Secondary | ICD-10-CM | POA: Diagnosis not present

## 2020-11-24 DIAGNOSIS — R079 Chest pain, unspecified: Secondary | ICD-10-CM | POA: Diagnosis not present

## 2020-11-25 DIAGNOSIS — R109 Unspecified abdominal pain: Secondary | ICD-10-CM | POA: Diagnosis not present

## 2020-11-25 DIAGNOSIS — R0789 Other chest pain: Secondary | ICD-10-CM | POA: Diagnosis not present

## 2020-11-25 DIAGNOSIS — R112 Nausea with vomiting, unspecified: Secondary | ICD-10-CM | POA: Diagnosis not present

## 2020-11-28 DIAGNOSIS — R112 Nausea with vomiting, unspecified: Secondary | ICD-10-CM | POA: Diagnosis not present

## 2020-11-28 DIAGNOSIS — F411 Generalized anxiety disorder: Secondary | ICD-10-CM | POA: Diagnosis not present

## 2020-11-28 DIAGNOSIS — F32A Depression, unspecified: Secondary | ICD-10-CM | POA: Diagnosis not present

## 2020-12-07 DIAGNOSIS — R112 Nausea with vomiting, unspecified: Secondary | ICD-10-CM | POA: Diagnosis not present

## 2020-12-07 DIAGNOSIS — R1110 Vomiting, unspecified: Secondary | ICD-10-CM | POA: Diagnosis not present

## 2020-12-22 DIAGNOSIS — R1110 Vomiting, unspecified: Secondary | ICD-10-CM | POA: Diagnosis not present

## 2020-12-22 DIAGNOSIS — R1310 Dysphagia, unspecified: Secondary | ICD-10-CM | POA: Diagnosis not present

## 2020-12-22 DIAGNOSIS — K2289 Other specified disease of esophagus: Secondary | ICD-10-CM | POA: Diagnosis not present

## 2020-12-22 DIAGNOSIS — K219 Gastro-esophageal reflux disease without esophagitis: Secondary | ICD-10-CM | POA: Diagnosis not present

## 2021-01-26 ENCOUNTER — Encounter (HOSPITAL_COMMUNITY): Payer: Self-pay

## 2021-01-26 ENCOUNTER — Emergency Department (HOSPITAL_COMMUNITY): Payer: Self-pay

## 2021-01-26 ENCOUNTER — Emergency Department (HOSPITAL_COMMUNITY)
Admission: EM | Admit: 2021-01-26 | Discharge: 2021-01-27 | Disposition: A | Discharge status: Home / Self Care | Payer: Self-pay | Attending: Emergency Medicine | Admitting: Emergency Medicine

## 2021-01-26 ENCOUNTER — Other Ambulatory Visit: Payer: Self-pay

## 2021-01-26 DIAGNOSIS — Z79899 Other long term (current) drug therapy: Secondary | ICD-10-CM | POA: Insufficient documentation

## 2021-01-26 DIAGNOSIS — R112 Nausea with vomiting, unspecified: Secondary | ICD-10-CM | POA: Insufficient documentation

## 2021-01-26 DIAGNOSIS — R1013 Epigastric pain: Secondary | ICD-10-CM | POA: Insufficient documentation

## 2021-01-26 DIAGNOSIS — R079 Chest pain, unspecified: Secondary | ICD-10-CM | POA: Insufficient documentation

## 2021-01-26 LAB — BASIC METABOLIC PANEL
Anion gap: 13 (ref 5–15)
BUN: 13 mg/dL (ref 6–20)
CO2: 23 mmol/L (ref 22–32)
Calcium: 9.2 mg/dL (ref 8.9–10.3)
Chloride: 105 mmol/L (ref 98–111)
Creatinine, Ser: 0.68 mg/dL (ref 0.44–1.00)
GFR, Estimated: >60 mL/min (ref >60)
Glucose, Bld: 91 mg/dL (ref 70–99)
Potassium: 3.9 mmol/L (ref 3.5–5.1)
Sodium: 141 mmol/L (ref 135–145)

## 2021-01-26 LAB — CBC
HCT: 40.6 % (ref 36.0–46.0)
Hemoglobin: 13.1 g/dL (ref 12.0–15.0)
MCH: 29.5 pg (ref 26.0–34.0)
MCHC: 32.3 g/dL (ref 30.0–36.0)
MCV: 91.4 fL (ref 80.0–100.0)
Platelets: 270 10*3/uL (ref 150–400)
RBC: 4.44 MIL/uL (ref 3.87–5.11)
RDW: 13.8 % (ref 11.5–15.5)
WBC: 12 10*3/uL — ABNORMAL HIGH (ref 4.0–10.5)
nRBC: 0 % (ref 0.0–0.2)

## 2021-01-26 LAB — TROPONIN I (HIGH SENSITIVITY): Troponin I (High Sensitivity): <2 ng/L (ref <18)

## 2021-01-26 MED ORDER — SODIUM CHLORIDE 0.9 % IV BOLUS
1000.0000 mL | Freq: Once | INTRAVENOUS | Status: AC
  Administered 2021-01-26: 1000 mL via INTRAVENOUS

## 2021-01-26 MED ORDER — ONDANSETRON HCL 4 MG/2ML IJ SOLN
4.0000 mg | Freq: Once | INTRAMUSCULAR | Status: AC
  Administered 2021-01-26: 4 mg via INTRAVENOUS
  Filled 2021-01-26: qty 2

## 2021-01-26 NOTE — ED Triage Notes (Signed)
Pt reports for the past four months she has had vomiting after eating and drinking, pt had a swallow test done on Tuesday, results pending. Pt says she hasn't vomited in the past hour, reports going out to eat tonight and vomiting afterwards for an hour, also says she got choked while vomiting and chest pain started at that time as well.

## 2021-01-26 NOTE — ED Provider Notes (Signed)
[facility] EMERGENCY DEPARTMENT Provider Note   CSN: [id number] Arrival date & time: 01/26/21  2115     History Chief Complaint  Patient presents with  . Chest Pain    vomiting    Leah Hartman is a 25 y.o. female.  Patient is a 25 year old female with history of POTS, ovarian cysts, gastritis, ADHD.  She is brought by mom for evaluation of nausea and vomiting.  Patient had dinner at a restaurant this evening.  Shortly after eating, she began to have episodes of vomiting and abdominal cramping.  This occurred multiple times and mom brings her for evaluation.  At one point, she sounded as if she was choking and mother is concerned about aspiration.  Patient denies to me she is having trouble breathing.  She denies any fevers or chills.  She denies any diarrhea, bloody vomit, or bloody stool.  She does describe a constant pain in the epigastric region that radiates to the left upper quadrant.  Patient recently had some sort of 24-hour esophageal manometry study, the results of which are pending.  The history is provided by the patient.       Past Medical History:  Diagnosis Date  . Abdominal pain   . Dysmenorrhea 05/10/2015  . Endometriosis   . Gastritis    egd 04/07/15   . Nausea & vomiting   . Ovarian cyst   . Ovarian cyst rupture   . POTS (postural orthostatic tachycardia syndrome)   . Syncope and collapse   . Vitamin D deficiency     Patient Active Problem List   Diagnosis Date Noted  . Obesity (BMI 30-39.9) 12/21/2019  . Abnormal MRI, lumbar spine 12/21/2019  . Lumbar radiculopathy 12/21/2019  . IUD check up 03/15/2019  . Vitamin D deficiency 12/22/2018  . Sinusitis, acute frontal 11/21/2018  . Interstitial cystitis 06/18/2018  . ADHD 03/19/2018  . Mood swings 03/19/2018  . Dyslexia 03/19/2018  . POTS (postural orthostatic tachycardia syndrome) 03/19/2018  . Viral illness 01/02/2018  . Endometriosis 07/19/2015  . Personal history of perinatal problems  07/04/2015  . Chronic diarrhea 05/26/2015  . Chronic pelvic pain in female 05/10/2015  . Family history of endometriosis in first degree relative 05/10/2015  . Dysmenorrhea 05/10/2015  . Menorrhagia 05/10/2015  . Syncope 01/31/2015  . Chronic fatigue 01/31/2015  . SOB (shortness of breath) 09/27/2014    Past Surgical History:  Procedure Laterality Date  . APPENDECTOMY    . ESOPHAGOGASTRODUODENOSCOPY N/A 04/07/2015   Procedure: ESOPHAGOGASTRODUODENOSCOPY (EGD);  Surgeon: Midge Minium, MD;  Location: [facility];  Service: Gastroenterology;  Laterality: N/A;  . INTRAUTERINE DEVICE (IUD) INSERTION N/A 05/15/2015   Procedure: INTRAUTERINE DEVICE (IUD) INSERTION;  Surgeon: Herold Harms, MD;  Location: [facility];  Service: Gynecology;  Laterality: N/A;  . LAPAROSCOPY N/A 05/15/2015   Procedure: with excision and fulgeration of endometriosis;  Surgeon: Herold Harms, MD;  Location: [facility];  Service: Gynecology;  Laterality: N/A;  with excision and fulgeration of endometriosis  . OVARIAN CYST SURGERY Bilateral   . TONSILLECTOMY AND ADENOIDECTOMY    . TYMPANOSTOMY TUBE PLACEMENT       OB History   No obstetric history on file.     Family History  Problem Relation Age of Onset  . Hypertension Mother   . Diabetes Father   . Learning disabilities Father   . Birth defects Maternal Grandmother   . Birth defects Maternal Grandfather   . Birth defects Paternal Grandmother   . Birth defects  Paternal Grandfather   . Colon cancer Neg Hx   . Ovarian cancer Neg Hx   . Breast cancer Neg Hx     Social History   Tobacco Use  . Smoking status: Never Smoker  . Smokeless tobacco: Never Used  Vaping Use  . Vaping Use: Some days  Substance Use Topics  . Alcohol use: No    Alcohol/week: 0.0 standard drinks  . Drug use: No    Home Medications Prior to Admission medications   Medication Sig Start Date End Date Taking? Authorizing Provider  citalopram (CELEXA) 10 MG  tablet Take 10 mg by mouth daily. 12/26/20  Yes [provider]  ESOMEPRAZOLE MAGNESIUM PO Take 1 capsule by mouth daily.   Yes [provider]  promethazine (PHENERGAN) 12.5 MG tablet Take 12.5 mg by mouth. 11/14/20  Yes [provider]  levonorgestrel (LILETTA) 19.5 MCG/DAY IUD IUD by Intrauterine route. Patient not taking: Reported on 01/26/2021    [provider]  prochlorperazine (COMPAZINE) 10 MG tablet Take 1 tablet (10 mg total) by mouth every 6 (six) hours as needed for nausea or vomiting. Patient not taking: No sig reported 11/09/20   Dione Booze, MD    Allergies    Patient has no known allergies.  Review of Systems   Review of Systems  All other systems reviewed and are negative.   Physical Exam Updated Vital Signs BP 112/78   Pulse 94   Temp 98.1 F (36.7 C) (Oral)   Resp 17   Ht 5\' 9"  (1.753 m)   Wt 113.4 kg   SpO2 100%   BMI 36.92 kg/m   Physical Exam Vitals and nursing note reviewed.  Constitutional:      General: She is not in acute distress.    Appearance: She is well-developed and well-nourished. She is not diaphoretic.  HENT:     Head: Normocephalic and atraumatic.  Cardiovascular:     Rate and Rhythm: Normal rate and regular rhythm.     Heart sounds: No murmur heard. No friction rub. No gallop.   Pulmonary:     Effort: Pulmonary effort is normal. No respiratory distress.     Breath sounds: Normal breath sounds. No wheezing.  Abdominal:     General: Bowel sounds are normal. There is no distension.     Palpations: Abdomen is soft.     Tenderness: There is abdominal tenderness. There is no guarding or rebound.  Musculoskeletal:        General: Normal range of motion.     Cervical back: Normal range of motion and neck supple.  Skin:    General: Skin is warm and dry.  Neurological:     Mental Status: She is alert and oriented to person, place, and time.     ED Results / Procedures / Treatments   Labs (all labs  ordered are listed, but only abnormal results are displayed) Labs Reviewed  CBC - Abnormal; Notable for the following components:      Result Value   WBC 12.0 (*)    All other components within normal limits  BASIC METABOLIC PANEL  LIPASE, BLOOD  HEPATIC FUNCTION PANEL  POC URINE PREG, ED  I-STAT BETA HCG BLOOD, ED ([facility], [facility], [facility] ONLY)  TROPONIN I (HIGH SENSITIVITY)    EKG None  Radiology DG Chest Port 1 View  Result Date: 01/26/2021 CLINICAL DATA:  Vomiting EXAM: PORTABLE CHEST 1 VIEW COMPARISON:  12/23/2017 FINDINGS: The heart size and mediastinal contours are within normal  limits. Both lungs are clear. The visualized skeletal structures are unremarkable. IMPRESSION: No active disease. Electronically Signed   By: Helyn Numbers MD   On: 01/26/2021 22:45    Procedures Procedures   Medications Ordered in ED Medications  sodium chloride 0.9 % bolus 1,000 mL (has no administration in time range)  ondansetron (ZOFRAN) injection 4 mg (has no administration in time range)    ED Course  I have reviewed the triage vital signs and the nursing notes.  Pertinent labs & imaging results that were available during my care of the patient were reviewed by me and considered in my medical decision making (see chart for details).    MDM Rules/Calculators/A&P  Patient presenting here with complaints of nausea and vomiting that occurred after eating dinner this evening.  She has a history of chronic nausea and GI issues for which she is followed by gastroenterologist.  She recently had some sort of esophageal manometry study performed, the results of which are unknown.  She appears comfortable and in no distress.  She has not vomited throughout her ED course.  She has received IV fluids and Zofran, but continues to feel nauseated.  Patient's work-up is unremarkable.  She has a slight leukocytosis of twelve thousand, but no other abnormalities in her LFTs or lipase.  At this point, discharge seems  appropriate.  I will prescribe Zofran she can take in addition to her Phenergan.  She is to follow-up with her gastroenterologist next week.  I will make arrangements for an ultrasound as an outpatient to evaluate her gallbladder.  Final Clinical Impression(s) / ED Diagnoses Final diagnoses:  Chest pain    Rx / DC Orders ED Discharge Orders    None       Geoffery Lyons, MD 01/27/21 [phone]

## 2021-01-27 LAB — HEPATIC FUNCTION PANEL
ALT: 21 U/L (ref 0–44)
AST: 25 U/L (ref 15–41)
Albumin: 4.2 g/dL (ref 3.5–5.0)
Alkaline Phosphatase: 57 U/L (ref 38–126)
Bilirubin, Direct: 0.1 mg/dL (ref 0.0–0.2)
Indirect Bilirubin: 0.6 mg/dL (ref 0.3–0.9)
Total Bilirubin: 0.7 mg/dL (ref 0.3–1.2)
Total Protein: 6.8 g/dL (ref 6.5–8.1)

## 2021-01-27 LAB — LIPASE, BLOOD: Lipase: 29 U/L (ref 11–51)

## 2021-01-27 LAB — I-STAT BETA HCG BLOOD, ED (MC, WL, AP ONLY): I-stat hCG, quantitative: <5 m[IU]/mL (ref <5)

## 2021-01-27 LAB — TROPONIN I (HIGH SENSITIVITY): Troponin I (High Sensitivity): <2 ng/L (ref <18)

## 2021-01-27 MED ORDER — ONDANSETRON HCL 8 MG PO TABS: 8.0000 mg | ORAL_TABLET | ORAL | 0 refills | Status: DC | PRN

## 2021-01-27 MED ORDER — ONDANSETRON HCL 4 MG/2ML IJ SOLN
4.0000 mg | Freq: Once | INTRAMUSCULAR | Status: AC
  Administered 2021-01-27: 4 mg via INTRAVENOUS
  Filled 2021-01-27: qty 2

## 2021-01-27 MED ORDER — KETOROLAC TROMETHAMINE 30 MG/ML IJ SOLN
30.0000 mg | Freq: Once | INTRAMUSCULAR | Status: AC
  Administered 2021-01-27: 30 mg via INTRAVENOUS
  Filled 2021-01-27: qty 1

## 2021-01-27 NOTE — Discharge Instructions (Signed)
Continue medications as previously prescribed.  Begin taking Zofran as prescribed as needed for nausea.  Outpatient ultrasound as scheduled to evaluate your gallbladder.  Follow-up with your gastroenterologist later this week, and return to the ER if symptoms significantly worsen or change.

## 2021-01-29 ENCOUNTER — Ambulatory Visit (HOSPITAL_COMMUNITY)
Admission: RE | Admit: 2021-01-29 | Discharge: 2021-01-29 | Disposition: A | Discharge status: Home / Self Care | Payer: Self-pay | Source: Ambulatory Visit | Attending: Emergency Medicine | Admitting: Emergency Medicine

## 2021-01-29 ENCOUNTER — Other Ambulatory Visit: Payer: Self-pay

## 2021-01-29 DIAGNOSIS — R1110 Vomiting, unspecified: Secondary | ICD-10-CM | POA: Insufficient documentation

## 2021-01-29 DIAGNOSIS — R1013 Epigastric pain: Secondary | ICD-10-CM | POA: Insufficient documentation

## 2021-05-06 ENCOUNTER — Emergency Department (HOSPITAL_COMMUNITY): Payer: 59

## 2021-05-06 ENCOUNTER — Encounter (HOSPITAL_COMMUNITY): Payer: Self-pay | Admitting: Emergency Medicine

## 2021-05-06 ENCOUNTER — Emergency Department (HOSPITAL_COMMUNITY)
Admission: EM | Admit: 2021-05-06 | Discharge: 2021-05-06 | Disposition: A | Discharge status: Home / Self Care | Payer: 59 | Attending: Emergency Medicine | Admitting: Emergency Medicine

## 2021-05-06 ENCOUNTER — Other Ambulatory Visit: Payer: Self-pay

## 2021-05-06 DIAGNOSIS — Z20822 Contact with and (suspected) exposure to covid-19: Secondary | ICD-10-CM | POA: Insufficient documentation

## 2021-05-06 DIAGNOSIS — J385 Laryngeal spasm: Secondary | ICD-10-CM | POA: Insufficient documentation

## 2021-05-06 DIAGNOSIS — F419 Anxiety disorder, unspecified: Secondary | ICD-10-CM | POA: Insufficient documentation

## 2021-05-06 DIAGNOSIS — R0602 Shortness of breath: Secondary | ICD-10-CM | POA: Diagnosis present

## 2021-05-06 LAB — CBC
HCT: 41.3 % (ref 36.0–46.0)
Hemoglobin: 13.5 g/dL (ref 12.0–15.0)
MCH: 29.3 pg (ref 26.0–34.0)
MCHC: 32.7 g/dL (ref 30.0–36.0)
MCV: 89.8 fL (ref 80.0–100.0)
Platelets: 212 10*3/uL (ref 150–400)
RBC: 4.6 MIL/uL (ref 3.87–5.11)
RDW: 14.2 % (ref 11.5–15.5)
WBC: 10.5 10*3/uL (ref 4.0–10.5)
nRBC: 0 % (ref 0.0–0.2)

## 2021-05-06 LAB — COMPREHENSIVE METABOLIC PANEL
ALT: 20 U/L (ref 0–44)
AST: 25 U/L (ref 15–41)
Albumin: 4.1 g/dL (ref 3.5–5.0)
Alkaline Phosphatase: 60 U/L (ref 38–126)
Anion gap: 6 (ref 5–15)
BUN: 11 mg/dL (ref 6–20)
CO2: 24 mmol/L (ref 22–32)
Calcium: 8.8 mg/dL — ABNORMAL LOW (ref 8.9–10.3)
Chloride: 105 mmol/L (ref 98–111)
Creatinine, Ser: 0.6 mg/dL (ref 0.44–1.00)
GFR, Estimated: >60 mL/min (ref >60)
Glucose, Bld: 96 mg/dL (ref 70–99)
Potassium: 3.5 mmol/L (ref 3.5–5.1)
Sodium: 135 mmol/L (ref 135–145)
Total Bilirubin: 0.7 mg/dL (ref 0.3–1.2)
Total Protein: 7.3 g/dL (ref 6.5–8.1)

## 2021-05-06 LAB — D-DIMER, QUANTITATIVE: D-Dimer, Quant: 0.66 ug/mL-FEU — ABNORMAL HIGH (ref 0.00–0.50)

## 2021-05-06 LAB — GROUP A STREP BY PCR: Group A Strep by PCR: NOT DETECTED

## 2021-05-06 LAB — RESP PANEL BY RT-PCR (FLU A&B, COVID) ARPGX2
Influenza A by PCR: NEGATIVE
Influenza B by PCR: NEGATIVE
SARS Coronavirus 2 by RT PCR: NEGATIVE

## 2021-05-06 LAB — HCG, QUANTITATIVE, PREGNANCY: hCG, Beta Chain, Quant, S: <1 m[IU]/mL (ref <5)

## 2021-05-06 MED ORDER — IOHEXOL 350 MG/ML SOLN
100.0000 mL | Freq: Once | INTRAVENOUS | Status: AC | PRN
  Administered 2021-05-06: 100 mL via INTRAVENOUS

## 2021-05-06 MED ORDER — PREDNISONE 20 MG PO TABS: 20.0000 mg | ORAL_TABLET | Freq: Two times a day (BID) | ORAL | 0 refills | Status: DC

## 2021-05-06 MED ORDER — PREDNISONE 50 MG PO TABS: ORAL_TABLET | ORAL | 0 refills | Status: DC

## 2021-05-06 MED ORDER — METHYLPREDNISOLONE SODIUM SUCC 125 MG IJ SOLR
125.0000 mg | Freq: Once | INTRAMUSCULAR | Status: AC
  Administered 2021-05-06: 04:00:00 125 mg via INTRAVENOUS
  Filled 2021-05-06: qty 2

## 2021-05-06 MED ORDER — EPINEPHRINE 0.3 MG/0.3ML IJ SOAJ: 0.3000 mg | INTRAMUSCULAR | 0 refills | Status: DC | PRN

## 2021-05-06 MED ORDER — EPINEPHRINE 0.3 MG/0.3ML IJ SOAJ
0.3000 mg | Freq: Once | INTRAMUSCULAR | Status: AC
  Administered 2021-05-06: 04:00:00 0.3 mg via INTRAMUSCULAR
  Filled 2021-05-06: qty 0.3

## 2021-05-06 MED ORDER — RACEPINEPHRINE HCL 2.25 % IN NEBU
INHALATION_SOLUTION | RESPIRATORY_TRACT | Status: AC
  Filled 2021-05-06: qty 0.5

## 2021-05-06 MED ORDER — RACEPINEPHRINE HCL 2.25 % IN NEBU
0.5000 mL | INHALATION_SOLUTION | Freq: Once | RESPIRATORY_TRACT | Status: AC
  Administered 2021-05-06: 04:00:00 0.5 mL via RESPIRATORY_TRACT
  Filled 2021-05-06: qty 0.5

## 2021-05-06 MED ORDER — FAMOTIDINE IN NACL 20-0.9 MG/50ML-% IV SOLN
20.0000 mg | Freq: Once | INTRAVENOUS | Status: AC
  Administered 2021-05-06: 20 mg via INTRAVENOUS
  Filled 2021-05-06: qty 50

## 2021-05-06 MED ORDER — DIPHENHYDRAMINE HCL 50 MG/ML IJ SOLN
25.0000 mg | Freq: Once | INTRAMUSCULAR | Status: AC
  Administered 2021-05-06: 25 mg via INTRAVENOUS
  Filled 2021-05-06: qty 1

## 2021-05-06 NOTE — ED Notes (Signed)
Pt given sprite 

## 2021-05-06 NOTE — ED Triage Notes (Signed)
Pt c/o SOB that started about 2 hrs ago. Pt states she has had a sore throat for the past 2 days.

## 2021-05-06 NOTE — ED Provider Notes (Signed)
7:30 AM-checkout from Dr. Manus Gunning to evaluate patient after period of observation, to determine if she is stable for discharge.  She was treated earlier for stridor, felt to be secondary to vaping.  She received IM epinephrine as well as racemic epinephrine nebulizer.  10:00 AM-she is feeling better, she states that the medications have soothe her throat.  She is not having shortness of breath, neck pain, or difficulty speaking.  She feels her problem has resolved.  Findings discussed with patient and her family member with her, all questions were answered   Mancel Bale, MD 05/06/21 1010

## 2021-05-06 NOTE — ED Notes (Signed)
Placed on long cool mist nebulizer

## 2021-05-06 NOTE — ED Provider Notes (Signed)
[facility] EMERGENCY DEPARTMENT Provider Note   CSN: [id number] Arrival date & time: 05/06/21  [id number]     History Chief Complaint  Patient presents with   Shortness of Breath   Sore Throat    Leah Hartman is a 25 y.o. female.  Level 5 caveat for acuity of condition.  Patient presenting with throat tightness, shortness of breath and difficulty breathing.  She is not able to give much of a history.  She complains of a severe sore throat for the past 2 days.  She woke up from sleep with shortness of breath and difficulty with swallowing and throat tightness 2 hours ago.  No difficulty swallowing her spit.  No cough or fever.  No chest pain. No history of asthma or COPD.  No abdominal pain, nausea or vomiting.  The history is provided by the patient.  Shortness of Breath Sore Throat Associated symptoms include shortness of breath.      Past Medical History:  Diagnosis Date   Abdominal pain    Dysmenorrhea 05/10/2015   Endometriosis    Gastritis    egd 04/07/15    Nausea & vomiting    Ovarian cyst    Ovarian cyst rupture    POTS (postural orthostatic tachycardia syndrome)    Syncope and collapse    Vitamin D deficiency     Patient Active Problem List   Diagnosis Date Noted   Obesity (BMI 30-39.9) 12/21/2019   Abnormal MRI, lumbar spine 12/21/2019   Lumbar radiculopathy 12/21/2019   IUD check up 03/15/2019   Vitamin D deficiency 12/22/2018   Sinusitis, acute frontal 11/21/2018   Interstitial cystitis 06/18/2018   ADHD 03/19/2018   Mood swings 03/19/2018   Dyslexia 03/19/2018   POTS (postural orthostatic tachycardia syndrome) 03/19/2018   Viral illness 01/02/2018   Endometriosis 07/19/2015   Personal history of perinatal problems 07/04/2015   Chronic diarrhea 05/26/2015   Chronic pelvic pain in female 05/10/2015   Family history of endometriosis in first degree relative 05/10/2015   Dysmenorrhea 05/10/2015   Menorrhagia 05/10/2015   Syncope 01/31/2015   Chronic  fatigue 01/31/2015   SOB (shortness of breath) 09/27/2014    Past Surgical History:  Procedure Laterality Date   APPENDECTOMY     ESOPHAGOGASTRODUODENOSCOPY N/A 04/07/2015   Procedure: ESOPHAGOGASTRODUODENOSCOPY (EGD);  Surgeon: Midge Miniumarren Wohl, MD;  Location[facility];  Service: Gastroenterology;  Laterality: N/A;   INTRAUTERINE DEVICE (IUD) INSERTION N/A 05/15/2015   Procedure: INTRAUTERINE DEVICE (IUD) INSERTION;  Surgeon: Herold HarmsMartin A Defrancesco, MD;  Location: [facility];  Service: Gynecology;  Laterality: N/A;   LAPAROSCOPY N/A 05/15/2015   Procedure: with excision and fulgeration of endometriosis;  Surgeon: Herold HarmsMartin A Defrancesco, MD;  Location: [facility];  Service: Gynecology;  Laterality: N/A;  with excision and fulgeration of endometriosis   OVARIAN CYST SURGERY Bilateral    TONSILLECTOMY AND ADENOIDECTOMY     TYMPANOSTOMY TUBE PLACEMENT       OB History   No obstetric history on file.     Family History  Problem Relation Age of Onset   Hypertension Mother    Diabetes Father    Learning disabilities Father    Birth defects Maternal Grandmother    Birth defects Maternal Grandfather    Birth defects Paternal Grandmother    Birth defects Paternal Grandfather    Colon cancer Neg Hx    Ovarian cancer Neg Hx    Breast cancer Neg Hx     Social History   Tobacco Use  Smoking status: Never   Smokeless tobacco: Never  Vaping Use   Vaping Use: Some days  Substance Use Topics   Alcohol use: No    Alcohol/week: 0.0 standard drinks   Drug use: No    Home Medications Prior to Admission medications   Medication Sig Start Date End Date Taking? Authorizing Provider  citalopram (CELEXA) 10 MG tablet Take 10 mg by mouth daily. 12/26/20   [provider]  ESOMEPRAZOLE MAGNESIUM PO Take 1 capsule by mouth daily.    [provider]  levonorgestrel (LILETTA) 19.5 MCG/DAY IUD IUD by Intrauterine route. Patient not taking: Reported on 01/26/2021    [provider]  ondansetron (ZOFRAN) 8 MG tablet Take 1 tablet (8 mg total) by mouth every 4 (four) hours as needed for nausea. 01/27/21   Geoffery Lyons, MD  prochlorperazine (COMPAZINE) 10 MG tablet Take 1 tablet (10 mg total) by mouth every 6 (six) hours as needed for nausea or vomiting. Patient not taking: No sig reported 11/09/20   Dione Booze, MD  promethazine (PHENERGAN) 12.5 MG tablet Take 12.5 mg by mouth. 11/14/20   [provider]    Allergies    Patient has no known allergies.  Review of Systems   Review of Systems  Unable to perform ROS: Severe respiratory distress  Respiratory:  Positive for shortness of breath.    Physical Exam Updated Vital Signs BP 125/90 (BP Location: Left Arm)   Pulse 97   Temp 98.4 F (36.9 C) (Oral)   Resp (!) 22   Ht 5\' 9"  (1.753 m)   Wt 104.3 kg   SpO2 100%   BMI 33.97 kg/m   Physical Exam Vitals and nursing note reviewed.  Constitutional:      General: She is not in acute distress.    Appearance: She is well-developed. She is ill-appearing.     Comments: Anxious, upper airway stridor present  HENT:     Head: Normocephalic and atraumatic.     Mouth/Throat:     Pharynx: No oropharyngeal exudate.     Comments: Mild erythema of the oropharynx.  Uvula is midline.  No tongue elevation.  No obvious asymmetry.  Controlling secretions. Tongue and lips are normal Eyes:     Conjunctiva/sclera: Conjunctivae normal.     Pupils: Pupils are equal, round, and reactive to light.  Neck:     Comments: No meningismus. Cardiovascular:     Rate and Rhythm: Normal rate and regular rhythm.     Heart sounds: Normal heart sounds. No murmur heard. Pulmonary:     Effort: Pulmonary effort is normal. No respiratory distress.     Breath sounds: Normal breath sounds. Stridor present. No wheezing.  Abdominal:     Palpations: Abdomen is soft.     Tenderness: There is no abdominal tenderness. There is no guarding or rebound.  Musculoskeletal:         General: No tenderness. Normal range of motion.     Cervical back: Normal range of motion and neck supple.  Skin:    General: Skin is warm.     Capillary Refill: Capillary refill takes less than 2 seconds.     Findings: No rash.  Neurological:     General: No focal deficit present.     Mental Status: She is alert and oriented to person, place, and time. Mental status is at baseline.     Cranial Nerves: No cranial nerve deficit.     Motor: No abnormal muscle tone.  Coordination: Coordination normal.     Comments:  5/5 strength throughout. CN 2-12 intact.Equal grip strength.   Psychiatric:        Behavior: Behavior normal.    ED Results / Procedures / Treatments   Labs (all labs ordered are listed, but only abnormal results are displayed) Labs Reviewed  COMPREHENSIVE METABOLIC PANEL - Abnormal; Notable for the following components:      Result Value   Calcium 8.8 (*)    All other components within normal limits  D-DIMER, QUANTITATIVE - Abnormal; Notable for the following components:   D-Dimer, Quant 0.66 (*)    All other components within normal limits  GROUP A STREP BY PCR  RESP PANEL BY RT-PCR (FLU A&B, COVID) ARPGX2  CBC  HCG, QUANTITATIVE, PREGNANCY    EKG EKG Interpretation  Date/Time:  Sunday May 06 2021 04:35:08 EDT Ventricular Rate:  79 PR Interval:  111 QRS Duration: 91 QT Interval:  392 QTC Calculation: 450 R Axis:   69 Text Interpretation: Sinus rhythm Borderline short PR interval Borderline Q waves in lateral leads Borderline repolarization abnormality Baseline wander in lead(s) V1 No significant change was found Confirmed by Glynn Octave [phone]) on 05/06/2021 4:38:04 AM  Radiology DG Neck Soft Tissue  Result Date: 05/06/2021 CLINICAL DATA:  Stridor, shortness of breath, sore throat for 2 days EXAM: NECK SOFT TISSUES - 1+ VIEW COMPARISON:  Contemporary chest radiograph FINDINGS: There is no evidence of retropharyngeal soft tissue swelling or  epiglottic enlargement. The cervical airway is unremarkable and no unexpected radio-opaque foreign body identified. Metallic earrings project over soft tissues lateral. Osseous structures are free of acute abnormality. IMPRESSION: Negative. Electronically Signed   By: Kreg Shropshire M.D.   On: 05/06/2021 04:25   DG Chest Portable 1 View  Result Date: 05/06/2021 CLINICAL DATA:  Shortness of breath EXAM: PORTABLE CHEST 1 VIEW COMPARISON:  Radiograph 01/26/2021 FINDINGS: Some streaky atelectatic changes towards the bases. No consolidation, features of edema, pneumothorax, or effusion. Pulmonary vascularity remains normally distributed. The cardiomediastinal contours are unremarkable. No acute osseous or soft tissue abnormality. IMPRESSION: Streaky atelectatic changes. No other acute cardiopulmonary abnormality Electronically Signed   By: Kreg Shropshire M.D.   On: 05/06/2021 04:26    Procedures .Critical Care  Date/Time: 05/06/2021 7:20 AM Performed by: Glynn Octave, MD Authorized by: Glynn Octave, MD   Critical care provider statement:    Critical care time (minutes):  45   Critical care was necessary to treat or prevent imminent or life-threatening deterioration of the following conditions:  Respiratory failure   Critical care was time spent personally by me on the following activities:  Discussions with consultants, evaluation of patient's response to treatment, examination of patient, ordering and performing treatments and interventions, ordering and review of laboratory studies, ordering and review of radiographic studies, pulse oximetry, re-evaluation of patient's condition, obtaining history from patient or surrogate and review of old charts   Medications Ordered in ED Medications  EPINEPHrine (EPI-PEN) injection 0.3 mg (has no administration in time range)  Racepinephrine HCl 2.25 % nebulizer solution 0.5 mL (has no administration in time range)  methylPREDNISolone sodium succinate  (SOLU-MEDROL) 125 mg/2 mL injection 125 mg (has no administration in time range)  famotidine (PEPCID) IVPB 20 mg premix (has no administration in time range)  diphenhydrAMINE (BENADRYL) injection 25 mg (has no administration in time range)    ED Course  I have reviewed the triage vital signs and the nursing notes.  Pertinent labs & imaging results that were  available during my care of the patient were reviewed by me and considered in my medical decision making (see chart for details).    MDM Rules/Calculators/A&P                         Sore throat with difficulty breathing and upper airway stridor.  No fever.  Patient tolerating some secretions.  Consider allergic reaction versus infection.  Will treat with IM epinephrine as well as nebulized racemic epinephrine. Steroids and antihistamines provided.  Chest x-ray is negative.  Soft tissue neck x-ray shows no retropharyngeal swelling and normal epiglottis.  Patient feels improved.  She is not wheezing on recheck.  Her stridor has resolved.  She received epinephrine as well as steroids.  CT scan does not show any explanation for her stridor.  No epiglottitis or tonsillar abscess.  No pulmonary embolism or pneumonia  7 AM, patient is improved.  She feels back to baseline.  Uncle at bedside reports she vapes on a regular basis.  This may be contributing to her presentation.  Suspect possible laryngeal spasm from vaping.  She denies any other allergic pathology and had no rash or itching.  She was not wheezing.  Discussed her reassuring work-up.  She is not hypoxic or tachycardic.  Will attempt p.o. trial.  Anticipate she will be able to be discharged after 4 hours of observation since I am epinephrine. Advised to cease vaping.  Will discharge home with EpiPen, steroids and antihistamines. Instructions for EpiPen given.  Discussed stopping vaping. Will attempt p.o. trial anticipate discharge home at 9:30 AM.  Dr. Effie Shy to assume  care   Final Clinical Impression(s) / ED Diagnoses Final diagnoses:  Laryngospasm    Rx / DC Orders ED Discharge Orders     None        Curlie Sittner, Jeannett Senior, MD 05/06/21 [phone]

## 2021-05-06 NOTE — ED Notes (Signed)
Patient completed po challenge without difficulty, 8oz ginger ale, stated that she is feeling much better.

## 2021-05-06 NOTE — Discharge Instructions (Addendum)
We suspect possible allergic reaction to vaping versus other allergen. You should stop vaping.  Take the  steroids as prescribed.  Also take Benadryl 25 mg 4 times a day, for 5 days with Pepcid 20 mg, twice a day for 5 days.    Use the epinephrine pen only for severe allergic reaction with difficulty breathing or difficulty swallowing.  If you use the epinephrine pen You must come to the hospital afterwards.  Ask your primary care doctor to refer you to an allergist for further testing to determine a cause for the incident today.

## 2021-05-07 LAB — OB RESULTS CONSOLE GC/CHLAMYDIA: Chlamydia: NEGATIVE

## 2021-05-07 LAB — HM PAP SMEAR: HM Pap smear: NEGATIVE

## 2021-12-04 ENCOUNTER — Ambulatory Visit: Payer: Self-pay | Admitting: Family Medicine

## 2021-12-04 ENCOUNTER — Encounter: Payer: Self-pay | Admitting: Family Medicine

## 2021-12-04 ENCOUNTER — Other Ambulatory Visit: Payer: Self-pay

## 2021-12-04 DIAGNOSIS — Z1388 Encounter for screening for disorder due to exposure to contaminants: Secondary | ICD-10-CM | POA: Diagnosis not present

## 2021-12-04 DIAGNOSIS — Z0389 Encounter for observation for other suspected diseases and conditions ruled out: Secondary | ICD-10-CM | POA: Diagnosis not present

## 2021-12-04 DIAGNOSIS — Z202 Contact with and (suspected) exposure to infections with a predominantly sexual mode of transmission: Secondary | ICD-10-CM

## 2021-12-04 DIAGNOSIS — Z113 Encounter for screening for infections with a predominantly sexual mode of transmission: Secondary | ICD-10-CM

## 2021-12-04 DIAGNOSIS — Z3009 Encounter for other general counseling and advice on contraception: Secondary | ICD-10-CM | POA: Diagnosis not present

## 2021-12-04 LAB — WET PREP FOR TRICH, YEAST, CLUE
Trichomonas Exam: NEGATIVE
Yeast Exam: NEGATIVE

## 2021-12-04 MED ORDER — DOXYCYCLINE HYCLATE 100 MG PO TABS: 100.0000 mg | ORAL_TABLET | Freq: Two times a day (BID) | ORAL | 0 refills | Status: AC

## 2021-12-04 NOTE — Progress Notes (Signed)
[facility] Department  STI clinic/screening visit [address] [place] [address] [phone]  Subjective:  Leah Hartman is a 26 y.o. female being seen today for an STI screening visit. The patient reports they do not have symptoms.  Patient reports that they do not desire a pregnancy in the next year.   They reported they are not interested in discussing contraception today.    Partner with NGU  Patient's last menstrual period was 08/09/2021 (approximate).   Patient has the following medical conditions:   Patient Active Problem List   Diagnosis Date Noted   Obesity (BMI 30-39.9) 12/21/2019   Abnormal MRI, lumbar spine 12/21/2019   Lumbar radiculopathy 12/21/2019   IUD check up 03/15/2019   Vitamin D deficiency 12/22/2018   Sinusitis, acute frontal 11/21/2018   Interstitial cystitis 06/18/2018   ADHD 03/19/2018   Mood swings 03/19/2018   Dyslexia 03/19/2018   POTS (postural orthostatic tachycardia syndrome) 03/19/2018   Viral illness 01/02/2018   Endometriosis 07/19/2015   Personal history of perinatal problems 07/04/2015   Chronic diarrhea 05/26/2015   Chronic pelvic pain in female 05/10/2015   Family history of endometriosis in first degree relative 05/10/2015   Dysmenorrhea 05/10/2015   Menorrhagia 05/10/2015   Syncope 01/31/2015   Chronic fatigue 01/31/2015   SOB (shortness of breath) 09/27/2014    Chief Complaint  Patient presents with   Exposure to STD    screening    Exposure to STD    Patient reports vaginal discharge 1-2 weeks  Last HIV test per patient/review of record was 1+ year ago Patient reports last pap was at [facility]   Screening for MPX risk: Does the patient have an unexplained rash? No Is the patient MSM? No Does the patient endorse multiple sex partners or anonymous sex partners? No Did the patient have close or sexual contact with a person diagnosed with MPX? No Has the patient traveled outside the [place] where MPX  is endemic? No Is there a high clinical suspicion for MPX-- evidenced by one of the following No  -Unlikely to be chickenpox  -Lymphadenopathy  -Rash that present in same phase of evolution on any given body part See flowsheet for further details and programmatic requirements.    The following portions of the patient's history were reviewed and updated as appropriate: allergies, current medications, past medical history, past social history, past surgical history and problem list.  Objective:  There were no vitals filed for this visit.  Physical Exam Vitals and nursing note reviewed.  Constitutional:      Appearance: Normal appearance.  HENT:     Head: Normocephalic and atraumatic.     Mouth/Throat:     Mouth: Mucous membranes are moist.     Pharynx: Oropharynx is clear. No oropharyngeal exudate or posterior oropharyngeal erythema.  Pulmonary:     Effort: Pulmonary effort is normal.  Abdominal:     General: Abdomen is flat.     Palpations: There is no mass.     Tenderness: There is no abdominal tenderness. There is no rebound.  Genitourinary:    General: Normal vulva.     Exam position: Lithotomy position.     Pubic Area: No rash or pubic lice.      Labia:        Right: No rash or lesion.        Left: No rash or lesion.      Vagina: Vaginal discharge (thin, yellow. ph > 4.5) present. No erythema,  bleeding or lesions.     Cervix: No cervical motion tenderness, discharge, friability, lesion or erythema.     Uterus: Normal.      Adnexa: Right adnexa normal and left adnexa normal.     Rectum: Normal.  Lymphadenopathy:     Head:     Right side of head: No preauricular or posterior auricular adenopathy.     Left side of head: No preauricular or posterior auricular adenopathy.     Cervical: No cervical adenopathy.     Upper Body:     Right upper body: No supraclavicular or axillary adenopathy.     Left upper body: No supraclavicular or axillary adenopathy.     Lower Body: No  right inguinal adenopathy. No left inguinal adenopathy.  Skin:    General: Skin is warm and dry.     Findings: No rash.  Neurological:     Mental Status: She is alert and oriented to person, place, and time.     Assessment and Plan:  Leah Hartman is a 26 y.o. female presenting to the [facility] Department for STI screening 1. Screening examination for venereal disease Treat wet prep per standing - WET PREP FOR TRICH, YEAST, CLUE - Chlamydia/Gonorrhea [facility]  2. Exposure to STD Treat as contact to NGU (doxycycline)   No follow-ups on file.  No future appointments.  Federico Flake, MD

## 2021-12-04 NOTE — Progress Notes (Signed)
Pt is here as a contact to NGU.  Wet mount results reviewed.  Medication dispensed per Provider orders.  Pt declined condoms. Berdie Ogren, RN

## 2021-12-04 NOTE — Progress Notes (Signed)
Pt states she has been exposed to NGU. BF, per own doctor. Pt also states bf is very symptomatic.

## 2021-12-25 ENCOUNTER — Ambulatory Visit: Payer: Self-pay | Admitting: Family Medicine

## 2021-12-25 ENCOUNTER — Encounter: Payer: Self-pay | Admitting: Family Medicine

## 2021-12-25 ENCOUNTER — Other Ambulatory Visit: Payer: Self-pay

## 2021-12-25 DIAGNOSIS — Z0389 Encounter for observation for other suspected diseases and conditions ruled out: Secondary | ICD-10-CM | POA: Diagnosis not present

## 2021-12-25 DIAGNOSIS — Z113 Encounter for screening for infections with a predominantly sexual mode of transmission: Secondary | ICD-10-CM

## 2021-12-25 DIAGNOSIS — Z3009 Encounter for other general counseling and advice on contraception: Secondary | ICD-10-CM | POA: Diagnosis not present

## 2021-12-25 DIAGNOSIS — Z1388 Encounter for screening for disorder due to exposure to contaminants: Secondary | ICD-10-CM | POA: Diagnosis not present

## 2021-12-25 DIAGNOSIS — Z202 Contact with and (suspected) exposure to infections with a predominantly sexual mode of transmission: Secondary | ICD-10-CM

## 2021-12-25 LAB — WET PREP FOR TRICH, YEAST, CLUE
Trichomonas Exam: NEGATIVE
Yeast Exam: NEGATIVE

## 2021-12-25 MED ORDER — METRONIDAZOLE 500 MG PO TABS: 500.0000 mg | ORAL_TABLET | Freq: Two times a day (BID) | ORAL | 0 refills | Status: AC

## 2021-12-25 NOTE — Progress Notes (Signed)
[facility] Department  STI clinic/screening visit [place] [address] [phone]  Subjective:  Leah Hartman is a 26 y.o. female being seen today for an STI screening visit. The patient reports they do not have symptoms.  Patient reports that they do not desire a pregnancy in the next year.   They reported they are not interested in discussing contraception today.    No LMP recorded. (Menstrual status: IUD).   Patient has the following medical conditions:   Patient Active Problem List   Diagnosis Date Noted   Obesity (BMI 30-39.9) 12/21/2019   Abnormal MRI, lumbar spine 12/21/2019   Lumbar radiculopathy 12/21/2019   IUD check up 03/15/2019   Vitamin D deficiency 12/22/2018   Sinusitis, acute frontal 11/21/2018   Interstitial cystitis 06/18/2018   ADHD 03/19/2018   Mood swings 03/19/2018   Dyslexia 03/19/2018   POTS (postural orthostatic tachycardia syndrome) 03/19/2018   Viral illness 01/02/2018   Endometriosis 07/19/2015   Personal history of perinatal problems 07/04/2015   Chronic diarrhea 05/26/2015   Chronic pelvic pain in female 05/10/2015   Family history of endometriosis in first degree relative 05/10/2015   Dysmenorrhea 05/10/2015   Menorrhagia 05/10/2015   Syncope 01/31/2015   Chronic fatigue 01/31/2015   SOB (shortness of breath) 09/27/2014    Chief Complaint  Patient presents with   Exposure to STD    Tx    HPI  Patient reports her partner was treated for Trich and she needs treatment  States that she and partner ws treated for NGU this month.  States that they have used condoms with all sexual activity since treatment was completed.  Last HIV test per patient/review of record was 06/2018            Patient reports last pap was ? 2 years.   Screening for MPX risk: Does the patient have an unexplained rash? No Is the patient MSM? No Does the patient endorse multiple sex partners or anonymous sex partners? No Did  the patient have close or sexual contact with a person diagnosed with MPX? No Has the patient traveled outside the [place] where MPX is endemic? No Is there a high clinical suspicion for MPX-- evidenced by one of the following No  -Unlikely to be chickenpox  -Lymphadenopathy  -Rash that present in same phase of evolution on any given body part See flowsheet for further details and programmatic requirements.    The following portions of the patient's history were reviewed and updated as appropriate: allergies, current medications, past medical history, past social history, past surgical history and problem list.  Objective:  There were no vitals filed for this visit.  Physical Exam Vitals and nursing note reviewed.  Constitutional:      Appearance: Normal appearance.  HENT:     Head: Normocephalic and atraumatic.     Mouth/Throat:     Mouth: Mucous membranes are moist.     Pharynx: Oropharynx is clear. No oropharyngeal exudate or posterior oropharyngeal erythema.  Pulmonary:     Effort: Pulmonary effort is normal.  Abdominal:     General: Abdomen is flat.     Palpations: There is no mass.     Tenderness: There is no abdominal tenderness. There is no rebound.  Genitourinary:    Pubic Area: No rash or pubic lice.      Labia:        Right: No rash or lesion.        Left: No  rash or lesion.      Vagina: Normal. No vaginal discharge, erythema, bleeding or lesions.     Cervix: No cervical motion tenderness, discharge, friability, lesion or erythema.     Uterus: Normal.      Adnexa: Right adnexa normal and left adnexa normal.     Rectum: Normal.     Comments: Client declined speculum exam- self collected wet prep and NAAT. Lymphadenopathy:     Head:     Right side of head: No preauricular or posterior auricular adenopathy.     Left side of head: No preauricular or posterior auricular adenopathy.     Cervical: No cervical adenopathy.     Upper Body:     Right upper body: No  supraclavicular or axillary adenopathy.     Left upper body: No supraclavicular or axillary adenopathy.     Lower Body: No right inguinal adenopathy. No left inguinal adenopathy.  Skin:    General: Skin is warm and dry.     Findings: No rash.  Neurological:     Mental Status: She is alert and oriented to person, place, and time.     Assessment and Plan:  [name] is a 26 y.o. female presenting to the [facility] Department for STI screening  There are no diagnoses linked to this encounter.  1. Screening examination for venereal disease  - WET PREP FOR [place], YEAST, [facility]  2. Trichomonas contact  - metroNIDAZOLE (FLAGYL) 500 MG tablet; Take 1 tablet (500 mg total) by mouth 2 (two) times daily for 7 days.  Dispense: 14 tablet; Refill: 0  Co. Not to be sexually active x 1 week after treatment.  Return if symptoms worsen or fail to improve.  No future appointments.  Hassell Done, FNP

## 2021-12-25 NOTE — Progress Notes (Signed)
Pt here for STD screening.  Wet mount results reviewed.  Medication dispensed per Provider orders. Pt declined condoms. Salih Williamson M Vincenta Steffey, RN  

## 2022-03-18 DIAGNOSIS — Z3009 Encounter for other general counseling and advice on contraception: Secondary | ICD-10-CM | POA: Diagnosis not present

## 2022-03-18 DIAGNOSIS — Z1388 Encounter for screening for disorder due to exposure to contaminants: Secondary | ICD-10-CM | POA: Diagnosis not present

## 2022-03-18 DIAGNOSIS — Z0389 Encounter for observation for other suspected diseases and conditions ruled out: Secondary | ICD-10-CM | POA: Diagnosis not present

## 2022-03-21 ENCOUNTER — Ambulatory Visit: Payer: Medicaid Other

## 2022-03-29 ENCOUNTER — Ambulatory Visit: Payer: Medicaid Other | Admitting: Nurse Practitioner

## 2022-04-03 ENCOUNTER — Encounter: Payer: Self-pay | Admitting: Nurse Practitioner

## 2022-04-03 ENCOUNTER — Ambulatory Visit (INDEPENDENT_AMBULATORY_CARE_PROVIDER_SITE_OTHER): Payer: BC Managed Care – PPO | Admitting: Nurse Practitioner

## 2022-04-03 VITALS — BP 96/64 | HR 89 | Temp 97.5°F | Resp 14 | Ht 67.5 in | Wt 231.5 lb

## 2022-04-03 DIAGNOSIS — G90A Postural orthostatic tachycardia syndrome (POTS): Secondary | ICD-10-CM

## 2022-04-03 DIAGNOSIS — Z6835 Body mass index (BMI) 35.0-35.9, adult: Secondary | ICD-10-CM | POA: Diagnosis not present

## 2022-04-03 DIAGNOSIS — N809 Endometriosis, unspecified: Secondary | ICD-10-CM

## 2022-04-03 DIAGNOSIS — Z0000 Encounter for general adult medical examination without abnormal findings: Secondary | ICD-10-CM | POA: Diagnosis not present

## 2022-04-03 DIAGNOSIS — E559 Vitamin D deficiency, unspecified: Secondary | ICD-10-CM

## 2022-04-03 NOTE — Assessment & Plan Note (Signed)
Historical diagnosis, pending labs ?

## 2022-04-03 NOTE — Assessment & Plan Note (Signed)
Patient has been evaluated by Dr. Graciela Husbands electrophysiologist.  Is been almost 2 years since she has been evaluated but patient did become uninsured at the time she has had periods of time where she has to miss work due to exacerbations and brought a form for me to fill out.  Patient also like to be referred to [facility] cardiology they have a specialist in POTS syndrome she is unsure of the name to do research ?

## 2022-04-03 NOTE — Assessment & Plan Note (Signed)
Pending labs.  Work on lifestyle modifications ?

## 2022-04-03 NOTE — Assessment & Plan Note (Signed)
Discussed age-appropriate immunizations and screening exams. 
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
## 2022-04-03 NOTE — Patient Instructions (Signed)
Nice to see you today ?I will be in touch with the labs ?Follow up with me in 1 year, sooner if needed ? ?

## 2022-04-03 NOTE — Assessment & Plan Note (Signed)
Is followed by OB/GYN continue following with them ?

## 2022-04-04 ENCOUNTER — Telehealth: Payer: Self-pay

## 2022-04-04 LAB — CBC WITH DIFFERENTIAL/PLATELET
Basophils Absolute: 0 10*3/uL (ref 0.0–0.1)
Basophils Relative: 0.3 % (ref 0.0–3.0)
Eosinophils Absolute: 0.1 10*3/uL (ref 0.0–0.7)
Eosinophils Relative: 1.2 % (ref 0.0–5.0)
HCT: 38.4 % (ref 36.0–46.0)
Hemoglobin: 12.6 g/dL (ref 12.0–15.0)
Lymphocytes Relative: 24.9 % (ref 12.0–46.0)
Lymphs Abs: 2.8 10*3/uL (ref 0.7–4.0)
MCHC: 32.7 g/dL (ref 30.0–36.0)
MCV: 89.7 fl (ref 78.0–100.0)
Monocytes Absolute: 0.7 10*3/uL (ref 0.1–1.0)
Monocytes Relative: 6.5 % (ref 3.0–12.0)
Neutro Abs: 7.5 10*3/uL (ref 1.4–7.7)
Neutrophils Relative %: 67.1 % (ref 43.0–77.0)
Platelets: 259 10*3/uL (ref 150.0–400.0)
RBC: 4.28 Mil/uL (ref 3.87–5.11)
RDW: 14.8 % (ref 11.5–15.5)
WBC: 11.2 10*3/uL — ABNORMAL HIGH (ref 4.0–10.5)

## 2022-04-04 LAB — VITAMIN D 25 HYDROXY (VIT D DEFICIENCY, FRACTURES): VITD: 15.32 ng/mL — ABNORMAL LOW (ref 30.00–100.00)

## 2022-04-04 LAB — COMPREHENSIVE METABOLIC PANEL
ALT: 17 U/L (ref 0–35)
AST: 19 U/L (ref 0–37)
Albumin: 4.4 g/dL (ref 3.5–5.2)
Alkaline Phosphatase: 54 U/L (ref 39–117)
BUN: 16 mg/dL (ref 6–23)
CO2: 24 mEq/L (ref 19–32)
Calcium: 9.1 mg/dL (ref 8.4–10.5)
Chloride: 105 mEq/L (ref 96–112)
Creatinine, Ser: 0.81 mg/dL (ref 0.40–1.20)
GFR: 100.35 mL/min (ref >60.00)
Glucose, Bld: 80 mg/dL (ref 70–99)
Potassium: 4.1 mEq/L (ref 3.5–5.1)
Sodium: 139 mEq/L (ref 135–145)
Total Bilirubin: 0.5 mg/dL (ref 0.2–1.2)
Total Protein: 6.9 g/dL (ref 6.0–8.3)

## 2022-04-04 LAB — LIPID PANEL
Cholesterol: 165 mg/dL (ref 0–200)
HDL: 45.2 mg/dL (ref >39.00)
LDL Cholesterol: 101 mg/dL — ABNORMAL HIGH (ref 0–99)
NonHDL: 119.42
Total CHOL/HDL Ratio: 4
Triglycerides: 91 mg/dL (ref 0.0–149.0)
VLDL: 18.2 mg/dL (ref 0.0–40.0)

## 2022-04-04 LAB — HEMOGLOBIN A1C: Hgb A1c MFr Bld: 5.8 % (ref 4.6–6.5)

## 2022-04-04 LAB — TSH: TSH: 1.97 u[IU]/mL (ref 0.35–5.50)

## 2022-04-04 NOTE — Telephone Encounter (Signed)
Left detailed message for patient letting her know that her form is ready for pick up. Not sure if patient wants me to fax this for her instead. Also, there is a page where patient will need to sign. Form is on my desk ?

## 2022-04-05 NOTE — Telephone Encounter (Signed)
Patient came by the office and picked up her form. ?

## 2022-04-08 ENCOUNTER — Other Ambulatory Visit: Payer: Self-pay | Admitting: Nurse Practitioner

## 2022-04-08 DIAGNOSIS — E559 Vitamin D deficiency, unspecified: Secondary | ICD-10-CM

## 2022-04-08 MED ORDER — VITAMIN D (ERGOCALCIFEROL) 1.25 MG (50000 UNIT) PO CAPS: 50000.0000 [IU] | ORAL_CAPSULE | ORAL | 0 refills | Status: DC

## 2022-04-09 ENCOUNTER — Telehealth: Payer: Self-pay | Admitting: Nurse Practitioner

## 2022-04-09 DIAGNOSIS — E559 Vitamin D deficiency, unspecified: Secondary | ICD-10-CM

## 2022-04-09 NOTE — Telephone Encounter (Signed)
Encourage patient to contact the pharmacy for refills or they can request refills through [facility] ? ?Did the patient contact the pharmacy:  N/A ? ?LAST APPOINTMENT DATE: 04/03/2022 ? ?MEDICATION:  Vitamin D, Ergocalciferol, (DRISDOL) 1.25 MG (50000 UNIT) CAPS capsule ? ?Is the patient out of medication? N/A ? ?If not, how much is left? N/A ? ?Is this a 90 day supply: No ? ?PHARMACY:  [address] [place], [place], [place] [address] ?Phone Number: [phone] (Permanent Location) ? ?Let patient know to contact pharmacy at the end of the day to make sure medication is ready. ? ?Please notify patient to allow 48-72 hours to process ?  ?

## 2022-04-10 MED ORDER — VITAMIN D (ERGOCALCIFEROL) 1.25 MG (50000 UNIT) PO CAPS: 50000.0000 [IU] | ORAL_CAPSULE | ORAL | 0 refills | Status: DC

## 2022-04-10 NOTE — Telephone Encounter (Addendum)
Left message letting patient know that RX was re sent to [facility] pharmacy ?

## 2022-04-17 NOTE — Telephone Encounter (Signed)
Pt called about the paperwork, She said her work had some questions about the paperwork and she wants to talk with Anastasiya about it, please advise. Call back is [phone]

## 2022-04-18 ENCOUNTER — Other Ambulatory Visit: Payer: Self-pay | Admitting: Nurse Practitioner

## 2022-04-18 DIAGNOSIS — G90A Postural orthostatic tachycardia syndrome (POTS): Secondary | ICD-10-CM

## 2022-04-18 NOTE — Telephone Encounter (Signed)
Spoke with patient. Patient's work needs additional information added to the form in regards to the intermittent leave-like days and length of time. Discussed with Audria Nine and form has been updated. Placed form up front for pick up. Left detailed message on patient's voicemail that form is ready

## 2022-04-30 ENCOUNTER — Encounter: Payer: Self-pay | Admitting: *Deleted

## 2022-05-10 ENCOUNTER — Ambulatory Visit
Admission: RE | Admit: 2022-05-10 | Discharge: 2022-05-10 | Disposition: A | Discharge status: Home / Self Care | Payer: BC Managed Care – PPO | Source: Ambulatory Visit | Attending: Emergency Medicine | Admitting: Emergency Medicine

## 2022-05-10 ENCOUNTER — Telehealth: Payer: Self-pay

## 2022-05-10 VITALS — BP 117/79 | HR 79 | Temp 98.0°F | Resp 18

## 2022-05-10 DIAGNOSIS — F411 Generalized anxiety disorder: Secondary | ICD-10-CM

## 2022-05-10 DIAGNOSIS — F419 Anxiety disorder, unspecified: Secondary | ICD-10-CM

## 2022-05-10 MED ORDER — HYDROXYZINE HCL 25 MG PO TABS: 25.0000 mg | ORAL_TABLET | Freq: Four times a day (QID) | ORAL | 0 refills | Status: DC

## 2022-05-10 MED ORDER — HYDROXYZINE HCL 25 MG PO TABS: 25.0000 mg | ORAL_TABLET | Freq: Two times a day (BID) | ORAL | 0 refills | Status: DC | PRN
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
## 2022-05-10 NOTE — Telephone Encounter (Signed)
Left message to return call to our office.  

## 2022-05-10 NOTE — Discharge Instructions (Addendum)
[facility] [address], [place] [address]  [phone]     Take the hydroxyzine as directed.  Do not drive, operate machinery, or drink alcohol with this medication as it may cause drowsiness.    Follow-up with your primary care provider or mental health provider.
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
## 2022-05-10 NOTE — Telephone Encounter (Signed)
Patient called back states she went to urgent care and was given 3 day supply of hydroxyzine. I have made follow up with [facility] on Monday.

## 2022-05-10 NOTE — Telephone Encounter (Signed)
Looks like she has tried hydroxyzine in the past. It can be used for anxiety. I am ok sending some of that medication in for her until she is able to see me. It can cause drowsiness though

## 2022-05-10 NOTE — Telephone Encounter (Signed)
Patient returned phone call. °

## 2022-05-10 NOTE — Addendum Note (Signed)
Addended by: Eden Emms on: 05/10/2022 01:06 PM   Modules accepted: Orders

## 2022-05-13 ENCOUNTER — Ambulatory Visit (INDEPENDENT_AMBULATORY_CARE_PROVIDER_SITE_OTHER): Payer: BC Managed Care – PPO | Admitting: Nurse Practitioner

## 2022-05-13 ENCOUNTER — Other Ambulatory Visit: Payer: Self-pay | Admitting: Nurse Practitioner

## 2022-05-13 ENCOUNTER — Encounter: Payer: Self-pay | Admitting: Nurse Practitioner

## 2022-05-13 VITALS — BP 102/74 | HR 100 | Temp 97.1°F | Resp 14 | Ht 67.5 in | Wt 224.0 lb

## 2022-05-13 DIAGNOSIS — T7421XA Adult sexual abuse, confirmed, initial encounter: Secondary | ICD-10-CM

## 2022-05-13 DIAGNOSIS — G90A Postural orthostatic tachycardia syndrome (POTS): Secondary | ICD-10-CM | POA: Diagnosis not present

## 2022-05-13 DIAGNOSIS — F329 Major depressive disorder, single episode, unspecified: Secondary | ICD-10-CM

## 2022-05-13 DIAGNOSIS — F419 Anxiety disorder, unspecified: Secondary | ICD-10-CM

## 2022-05-13 MED ORDER — BUSPIRONE HCL 5 MG PO TABS: 5.0000 mg | ORAL_TABLET | Freq: Two times a day (BID) | ORAL | 0 refills | Status: DC

## 2022-05-13 NOTE — Addendum Note (Signed)
Addended by: Eden Emms on: 05/13/2022 01:59 PM   Modules accepted: Orders
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
## 2022-05-13 NOTE — Patient Instructions (Signed)
Nice to see you today Stop taking the hydroxyzine. If you need help sleeping you can take a benadryl 25mg  at night no more than 3 days in a row.  I sent in the new medication that you can start today Follow up with in 2 weeks , sooner if you need me
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
## 2022-05-14 ENCOUNTER — Encounter: Payer: Self-pay | Admitting: Nurse Practitioner

## 2022-05-14 NOTE — Telephone Encounter (Signed)
Once she responds can you give her a work note

## 2022-05-14 NOTE — Telephone Encounter (Signed)
Work note provided patient works Monday through Friday needed off through Friday. See other note from patient.

## 2022-05-21 DIAGNOSIS — R55 Syncope and collapse: Secondary | ICD-10-CM | POA: Diagnosis not present

## 2022-05-21 DIAGNOSIS — G4719 Other hypersomnia: Secondary | ICD-10-CM | POA: Diagnosis not present

## 2022-05-21 DIAGNOSIS — I498 Other specified cardiac arrhythmias: Secondary | ICD-10-CM | POA: Diagnosis not present

## 2022-05-21 DIAGNOSIS — I10 Essential (primary) hypertension: Secondary | ICD-10-CM | POA: Diagnosis not present

## 2022-05-21 DIAGNOSIS — R0683 Snoring: Secondary | ICD-10-CM | POA: Diagnosis not present

## 2022-06-03 ENCOUNTER — Encounter: Payer: Self-pay | Admitting: Nurse Practitioner

## 2022-06-03 ENCOUNTER — Ambulatory Visit (INDEPENDENT_AMBULATORY_CARE_PROVIDER_SITE_OTHER): Payer: BC Managed Care – PPO | Admitting: Nurse Practitioner

## 2022-06-03 DIAGNOSIS — F419 Anxiety disorder, unspecified: Secondary | ICD-10-CM | POA: Diagnosis not present

## 2022-06-03 DIAGNOSIS — G90A Postural orthostatic tachycardia syndrome (POTS): Secondary | ICD-10-CM | POA: Diagnosis not present

## 2022-06-03 MED ORDER — BUSPIRONE HCL 5 MG PO TABS: 5.0000 mg | ORAL_TABLET | Freq: Two times a day (BID) | ORAL | 11 refills | Status: DC

## 2022-06-03 NOTE — Patient Instructions (Signed)
Nice to see you today I sent in refills for the Buspar medication Follow up with me as scheduled next year, sooner if you need me

## 2022-06-03 NOTE — Assessment & Plan Note (Signed)
Has been evaluated by [facility] cardiology and referred to the POTS specialist.  Currently wearing a heart monitor that is scheduled be removed tomorrow and she is post to get echocardiogram tomorrow.
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
## 2022-06-03 NOTE — Assessment & Plan Note (Signed)
Patient was placed on BuSpar 5 mg twice daily doing well.  Patient like to continue medication.  PHQ-9 and GAD-7 administered in office and a marked improvement in scores.  Source of her anxiety and stress has been removed seems.  Follow-up if new developments or symptoms return or increase

## 2022-06-04 DIAGNOSIS — R55 Syncope and collapse: Secondary | ICD-10-CM | POA: Diagnosis not present

## 2022-06-14 ENCOUNTER — Encounter: Payer: Self-pay | Admitting: Nurse Practitioner

## 2022-06-14 ENCOUNTER — Ambulatory Visit (INDEPENDENT_AMBULATORY_CARE_PROVIDER_SITE_OTHER): Payer: BC Managed Care – PPO | Admitting: Nurse Practitioner

## 2022-06-14 VITALS — BP 118/76 | HR 93 | Temp 97.3°F | Ht 67.5 in | Wt 226.6 lb

## 2022-06-14 DIAGNOSIS — M79605 Pain in left leg: Secondary | ICD-10-CM | POA: Diagnosis not present

## 2022-06-14 DIAGNOSIS — M79604 Pain in right leg: Secondary | ICD-10-CM | POA: Insufficient documentation

## 2022-06-14 MED ORDER — CYCLOBENZAPRINE HCL 10 MG PO TABS: 10.0000 mg | ORAL_TABLET | Freq: Three times a day (TID) | ORAL | 0 refills | Status: DC | PRN

## 2022-06-14 NOTE — Progress Notes (Signed)
Acute Office Visit  Subjective:     Patient ID: Leah Hartman, female    DOB: May 26, 1996, 26 y.o.   MRN: [id number]  Chief Complaint  Patient presents with   Acute Visit    C/o having bilateral leg pain x 1 week.  Has been taking Advil and Icey hot with little relief.      HPI Patient is in today for bilateral leg pain for 1 week.  She describes it like her legs feel like they are going to cramp but they are not fully doing it.  She denies any injuries or recent falls.  She states that she tried Advil and that did not help her symptoms.  She ended up missing work yesterday and leaving early today due to the pain.  She denies swelling, changes in skin color.  She does have a history of POTS and is not sure if this is related.  She drinks a lot of water daily and will sometimes drink a 0-calorie Gatorade powder added to the water.  She has not started any new medications recently.  ROS See pertinent positives and negatives per HPI.    Objective:    BP 118/76   Pulse 93   Temp (!) 97.3 F (36.3 C) (Temporal)   Ht 5' 7.5" (1.715 m)   Wt 226 lb 9.6 oz (102.8 kg)   SpO2 98%   BMI 34.97 kg/m    Physical Exam Vitals and nursing note reviewed.  Constitutional:      General: She is not in acute distress.    Appearance: Normal appearance.  HENT:     Head: Normocephalic.  Eyes:     Conjunctiva/sclera: Conjunctivae normal.  Pulmonary:     Effort: Pulmonary effort is normal.  Musculoskeletal:        General: Tenderness (bilateral lower extremities, front and back) present. No swelling or deformity.     Cervical back: Normal range of motion.  Skin:    General: Skin is warm.  Neurological:     General: No focal deficit present.     Mental Status: She is alert and oriented to person, place, and time.  Psychiatric:        Mood and Affect: Mood normal.        Behavior: Behavior normal.        Thought Content: Thought content normal.        Judgment: Judgment normal.       Assessment & Plan:   Problem List Items Addressed This Visit       Other   Pain in both lower extremities - Primary    She has been having pain in her bilateral lower extremities which she describes as almost cramping for 1 week.  She states that it was severe yesterday and she ended up missing work and got sent home from work early today.  No swelling or changes in skin noted on exam.  She does have tenderness to palpation to front and back of her bilateral lower legs.  No red flags for DVT.  We will check BMP and magnesium.  Encouraged her to continue drinking plenty of fluids and adding in some Gatorade or Pedialyte in between water.  We will also have her start Flexeril as needed for muscle tightness or cramping.  Discussed that this may make her sleepy.  Follow-up with PCP in 1 week.  Work note given.      Relevant Orders   Basic Metabolic Panel (BMET)   Magnesium  Meds ordered this encounter  Medications   cyclobenzaprine (FLEXERIL) 10 MG tablet    Sig: Take 1 tablet (10 mg total) by mouth 3 (three) times daily as needed for muscle spasms.    Dispense:  30 tablet    Refill:  0    Return in about 1 week (around 06/21/2022) for leg pain with PCP.  Gerre Scull, NP

## 2022-06-14 NOTE — Assessment & Plan Note (Addendum)
She has been having pain in her bilateral lower extremities which she describes as almost cramping for 1 week.  She states that it was severe yesterday and she ended up missing work and got sent home from work early today.  No swelling or changes in skin noted on exam.  She does have tenderness to palpation to front and back of her bilateral lower legs.  No red flags for DVT.  We will check BMP and magnesium.  Encouraged her to continue drinking plenty of fluids and adding in some Gatorade or Pedialyte in between water.  We will also have her start Flexeril as needed for muscle tightness or cramping.  Discussed that this may make her sleepy.  Follow-up with PCP in 1 week.  Work note given.

## 2022-06-14 NOTE — Patient Instructions (Addendum)
It was great to see you!  We are checking your labs today and will let you know the results via mychart/phone.   Keep drinking plenty of fluids, especially adding in some gatorade or pedialyte.   Start flexeril 3 times a day as needed for pain/cramping. This may make you sleepy.  Let's follow-up in 1 week, sooner if you have concerns.  If a referral was placed today, you will be contacted for an appointment. Please note that routine referrals can sometimes take up to 3-4 weeks to process. Please call our office if you haven't heard anything after this time frame.  Take care,  Rodman Pickle, NP

## 2022-06-15 LAB — MAGNESIUM: Magnesium: 2 mg/dL (ref 1.5–2.5)

## 2022-06-15 LAB — BASIC METABOLIC PANEL
BUN: 15 mg/dL (ref 7–25)
CO2: 22 mmol/L (ref 20–32)
Calcium: 9.6 mg/dL (ref 8.6–10.2)
Chloride: 105 mmol/L (ref 98–110)
Creat: 0.66 mg/dL (ref 0.50–0.96)
Glucose, Bld: 74 mg/dL (ref 65–99)
Potassium: 4.2 mmol/L (ref 3.5–5.3)
Sodium: 138 mmol/L (ref 135–146)

## 2022-06-17 ENCOUNTER — Encounter: Payer: Self-pay | Admitting: Nurse Practitioner

## 2022-06-19 ENCOUNTER — Ambulatory Visit (INDEPENDENT_AMBULATORY_CARE_PROVIDER_SITE_OTHER): Payer: BC Managed Care – PPO | Admitting: Nurse Practitioner

## 2022-06-19 ENCOUNTER — Encounter: Payer: Self-pay | Admitting: Nurse Practitioner

## 2022-06-19 VITALS — BP 90/60 | HR 104 | Temp 97.2°F | Resp 14 | Ht 67.5 in | Wt 227.5 lb

## 2022-06-19 DIAGNOSIS — M79605 Pain in left leg: Secondary | ICD-10-CM | POA: Diagnosis not present

## 2022-06-19 DIAGNOSIS — M5410 Radiculopathy, site unspecified: Secondary | ICD-10-CM | POA: Insufficient documentation

## 2022-06-19 DIAGNOSIS — M79604 Pain in right leg: Secondary | ICD-10-CM | POA: Diagnosis not present

## 2022-06-19 MED ORDER — METHYLPREDNISOLONE ACETATE 80 MG/ML IJ SUSP
80.0000 mg | Freq: Once | INTRAMUSCULAR | Status: AC
  Administered 2022-06-19: 80 mg via INTRAMUSCULAR

## 2022-06-19 NOTE — Patient Instructions (Signed)
Nice to see you today Be in touch with me via mychart and let me know if this helps at all You can also use over the counter Voltaren gel sparingly on the legs Follow up if no improvement

## 2022-06-19 NOTE — Assessment & Plan Note (Signed)
.    We will administer Depo-Medrol 80 mg IM x1 dose.  Patient will update me via MyChart if symptoms not improve we need to consider differential such as a ms on the list

## 2022-06-19 NOTE — Progress Notes (Signed)
Established Patient Office Visit  Subjective   Patient ID: Leah Hartman, female    DOB: 26-Dec-1995  Age: 26 y.o. MRN: [id number]  Chief Complaint  Patient presents with   Leg Pain    Bilateral leg pain, started on 06/10/22. Pain is from the knees down. Saw provider on 06/14/22 and was given Flexeril to try which did not help the pain just made her sleepy. Numbness/tingling in her legs also.    Leg Pain  Associated symptoms include tingling.    Leg Pain: states that both her legs started hurting while she was sitting down. Just knees downs. States it feel like an electric shooting pain. Worse with standing and better with sitting but still. No injury to the legs or back. States that it is staying the same. Has tried ibuprofen, and flexeril without relif .  Does feel weak in the legs with tingling      Review of Systems  Constitutional:  Negative for chills and fever.  Genitourinary:        B&B negative   Neurological:  Positive for tingling and weakness.      Objective:     BP 90/60   Pulse (!) 104   Temp (!) 97.2 F (36.2 C)   Resp 14   Ht 5' 7.5" (1.715 m)   Wt 227 lb 8 oz (103.2 kg)   SpO2 97%   BMI 35.11 kg/m    Physical Exam Vitals and nursing note reviewed.  Constitutional:      Appearance: Normal appearance. She is obese.  Cardiovascular:     Rate and Rhythm: Normal rate and regular rhythm.     Pulses:          Dorsalis pedis pulses are 2+ on the right side and 2+ on the left side.       Posterior tibial pulses are 2+ on the right side and 2+ on the left side.     Heart sounds: Normal heart sounds.  Pulmonary:     Effort: Pulmonary effort is normal.     Breath sounds: Normal breath sounds.  Musculoskeletal:        General: No tenderness or signs of injury.     Lumbar back: No tenderness or bony tenderness. Negative right straight leg raise test and negative left straight leg raise test.  Skin:    Comments: 46.0 cm right calf 44.5 cm left calf   Neurological:     General: No focal deficit present.     Mental Status: She is alert.     Deep Tendon Reflexes:     Reflex Scores:      Patellar reflexes are 2+ on the right side and 2+ on the left side.    Comments: Bilateral lower extremity strength 5/5      No results found for any visits on 06/19/22.    The ASCVD Risk score (Arnett DK, et al., 2019) failed to calculate for the following reasons:   The 2019 ASCVD risk score is only valid for ages 53 to 47    Assessment & Plan:   Problem List Items Addressed This Visit       Other   Bilateral leg pain    Patient's electrolytes were normal.  Flexeril did not help.  Ibuprofen has not helped.  Will administer Depo-Medrol 80 mg IM in office.  Patient can also use over-the-counter Voltaren gel sparingly to see if this was beneficial.      Relevant Medications   methylPREDNISolone  acetate (DEPO-MEDROL) injection 80 mg   Radicular pain - Primary    .  We will administer Depo-Medrol 80 mg IM x1 dose.  Patient will update me via MyChart if symptoms not improve we need to consider differential such as a ms on the list      Relevant Medications   methylPREDNISolone acetate (DEPO-MEDROL) injection 80 mg    Return if symptoms worsen or fail to improve, for As scheduled.    Audria Nine, NP

## 2022-06-19 NOTE — Assessment & Plan Note (Signed)
Patient's electrolytes were normal.  Flexeril did not help.  Ibuprofen has not helped.  Will administer Depo-Medrol 80 mg IM in office.  Patient can also use over-the-counter Voltaren gel sparingly to see if this was beneficial.

## 2022-06-20 ENCOUNTER — Ambulatory Visit: Payer: BC Managed Care – PPO | Admitting: Nurse Practitioner

## 2022-06-30 DIAGNOSIS — R55 Syncope and collapse: Secondary | ICD-10-CM | POA: Diagnosis not present

## 2022-07-02 ENCOUNTER — Other Ambulatory Visit: Payer: Self-pay | Admitting: Nurse Practitioner

## 2022-07-02 DIAGNOSIS — F419 Anxiety disorder, unspecified: Secondary | ICD-10-CM

## 2022-09-10 HISTORY — PX: OTHER SURGICAL HISTORY: SHX169

## 2022-09-17 DIAGNOSIS — Z95818 Presence of other cardiac implants and grafts: Secondary | ICD-10-CM | POA: Insufficient documentation

## 2022-09-27 ENCOUNTER — Ambulatory Visit (INDEPENDENT_AMBULATORY_CARE_PROVIDER_SITE_OTHER): Payer: 59 | Admitting: Radiology

## 2022-09-27 ENCOUNTER — Other Ambulatory Visit (HOSPITAL_COMMUNITY)
Admission: RE | Admit: 2022-09-27 | Discharge: 2022-09-27 | Disposition: A | Discharge status: Home / Self Care | Payer: 59 | Source: Ambulatory Visit | Attending: Radiology | Admitting: Radiology

## 2022-09-27 ENCOUNTER — Encounter: Payer: Self-pay | Admitting: Radiology

## 2022-09-27 VITALS — BP 110/62 | Ht 67.75 in | Wt 227.0 lb

## 2022-09-27 DIAGNOSIS — Z01419 Encounter for gynecological examination (general) (routine) without abnormal findings: Secondary | ICD-10-CM | POA: Insufficient documentation

## 2022-09-27 DIAGNOSIS — Z23 Encounter for immunization: Secondary | ICD-10-CM | POA: Diagnosis not present

## 2022-09-27 DIAGNOSIS — Z975 Presence of (intrauterine) contraceptive device: Secondary | ICD-10-CM

## 2022-09-27 DIAGNOSIS — Z113 Encounter for screening for infections with a predominantly sexual mode of transmission: Secondary | ICD-10-CM | POA: Diagnosis not present

## 2022-09-27 NOTE — Progress Notes (Signed)
   Leah Hartman 12-05-1995 [id number]   History:  26 y.o. G0 presents for annual exam. 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 No other gyn concerns.   Gynecologic History No LMP recorded. (Menstrual status: IUD).   Contraception/Family planning: IUD Sexually active: yes Last Pap: 2022. Results were: abnormal   Obstetric History OB History  Gravida Para Term Preterm AB Living  0 0 0 0 0 0  SAB IAB Ectopic Multiple Live Births  0 0 0 0 0     The following portions of the patient's history were reviewed and updated as appropriate: allergies, current medications, past family history, past medical history, past social history, past surgical history, and problem list.  Review of Systems Pertinent items noted in HPI and remainder of comprehensive ROS otherwise negative.   Past medical history, past surgical history, family history and social history were all reviewed and documented in the EPIC chart.   Exam:  Vitals:   09/27/22 0828  BP: 110/62  Weight: 227 lb (103 kg)  Height: 5' 7.75" (1.721 m)   Body mass index is 34.77 kg/m.  General appearance:  Normal Thyroid:  Symmetrical, normal in size, without palpable masses or nodularity. Respiratory  Auscultation:  Clear without wheezing or rhonchi Cardiovascular  Auscultation:  Regular rate, without rubs, murmurs or gallops  Edema/varicosities:  Not grossly evident Abdominal  Soft,nontender, without masses, guarding or rebound.  Liver/spleen:  No organomegaly noted  Hernia:  None appreciated  Skin  Inspection:  Grossly normal Breasts: Examined lying and sitting.   Right: Without masses, retractions, nipple discharge or axillary adenopathy.   Left: Without masses, retractions, nipple discharge or axillary adenopathy. Genitourinary   Inguinal/mons:  Normal without inguinal adenopathy  External genitalia:  Normal appearing vulva with no masses, tenderness, or  lesions  BUS/Urethra/Skene's glands:  Normal without masses or exudate  Vagina:  Normal appearing with normal color and discharge, no lesions  Cervix:  Normal appearing without discharge or lesions  Uterus:  Normal in size, shape and contour.  Mobile, nontender  Adnexa/parametria:     Rt: Normal in size, without masses or tenderness.   Lt: Normal in size, without masses or tenderness.  Anus and perineum: Normal   Patient informed chaperone available to be present for breast and pelvic exam. Patient has requested no chaperone to be present. Patient has been advised what will be completed during breast and pelvic exam.   Assessment/Plan:   1. Well woman exam with routine gynecological exam ASCUS 2022 per pt - Cytology - PAP( [facility])  2. Screening for STDs (sexually transmitted diseases) - Cytology - PAP( [facility])  3. Need for HPV vaccination - HPV 9-valent vaccine,Recombinat  4. IUD (intrauterine device) in place Mirena inserted 2022, may leave until 07/2029     Discussed SBE, pap and STI screening as directed/appropriate. Recommend 138mins of exercise weekly, including weight bearing exercise. Encouraged the use of seatbelts and sunscreen. Return in 1 year for annual or as needed.   Rubbie Battiest B WHNP-BC 9:14 AM 09/27/2022

## 2022-10-01 LAB — CYTOLOGY - PAP
Chlamydia: NEGATIVE
Comment: NEGATIVE
Comment: NEGATIVE
Comment: NORMAL
Neisseria Gonorrhea: NEGATIVE
Trichomonas: NEGATIVE

## 2022-10-09 DIAGNOSIS — G43909 Migraine, unspecified, not intractable, without status migrainosus: Secondary | ICD-10-CM | POA: Diagnosis not present

## 2022-10-15 ENCOUNTER — Ambulatory Visit (INDEPENDENT_AMBULATORY_CARE_PROVIDER_SITE_OTHER): Payer: 59 | Admitting: Nurse Practitioner

## 2022-10-15 ENCOUNTER — Encounter: Payer: Self-pay | Admitting: Nurse Practitioner

## 2022-10-15 VITALS — BP 100/62 | HR 98 | Temp 98.3°F | Resp 14 | Ht 67.75 in | Wt 231.1 lb

## 2022-10-15 DIAGNOSIS — E669 Obesity, unspecified: Secondary | ICD-10-CM | POA: Diagnosis not present

## 2022-10-15 DIAGNOSIS — Z95818 Presence of other cardiac implants and grafts: Secondary | ICD-10-CM

## 2022-10-15 DIAGNOSIS — G44209 Tension-type headache, unspecified, not intractable: Secondary | ICD-10-CM | POA: Insufficient documentation

## 2022-10-15 DIAGNOSIS — G44201 Tension-type headache, unspecified, intractable: Secondary | ICD-10-CM | POA: Insufficient documentation

## 2022-10-15 MED ORDER — CYCLOBENZAPRINE HCL 5 MG PO TABS: 5.0000 mg | ORAL_TABLET | Freq: Two times a day (BID) | ORAL | 0 refills | Status: DC | PRN

## 2022-10-15 NOTE — Progress Notes (Signed)
Established Patient Office Visit  Subjective   Patient ID: Leah Hartman, female    DOB: [birth year]  Age: 26 y.o. MRN: [id number]  Chief Complaint  Patient presents with   Headache    X 10 days-every day. Went to UC on 10/11/22 and got pain medication and nausea medication injected. Headache did not resolve and lingering, achy/nagging headache. Nausea present.   weight medication discussion    She did try Mediterranean diet and Keto and weight watchers in the past.       Headache: states that it started approx 10 days ago. States that she did wake up with it. States that she will have them intermittently and will last for a few days. States that she went to UC on 10/11/2022. States that she got two injections at the UC of promethazine and toradal. Did not help at all. States she did pass out in the room. States that she has tired advil and tylenol alternating it. She also tried  benadryl that did not help. States that it feels like a rubber band all the way around. States that she will have a sharp intermittent pain on the right temple region.Marland Kitchen liught and sound sensitivty, nausea and one epoisde of vomiting   Obesity: States that she did do weight watchers earlier this year and did lose 5 pounds. Still watching portions. Will have 3 meals a day with no snacks. States that show illl have water. 1 cup of coffee daily that is black  States that she is hiking close to her house couple times a week. The trail takes approx 2 hours with breaks.   Sinus pause: had an episode of sinus pause which resulted in syncope.  Patient was wearing a temporary heart monitor and it did not capture the episode.  After that they did impant a loop recorder. States that she has been following with cardiology.  Has recently stopped vaping for the past 2 months. States that she is no longer drinking alcohol    Review of Systems  Constitutional:  Negative for chills and fever.  HENT:  Negative for sinus pain and  sore throat.   Eyes:  Negative for blurred vision and double vision.  Respiratory:  Negative for shortness of breath.   Cardiovascular:  Negative for chest pain.  Neurological:  Positive for headaches. Negative for tingling and weakness.      Objective:     BP 100/62   Pulse 98   Temp 98.3 F (36.8 C) (Oral)   Resp 14   Ht 5' 7.75" (1.721 m)   Wt 231 lb 2 oz (104.8 kg)   SpO2 97%   BMI 35.40 kg/m  BP Readings from Last 3 Encounters:  10/15/22 100/62  09/27/22 110/62  06/19/22 90/60   Wt Readings from Last 3 Encounters:  10/15/22 231 lb 2 oz (104.8 kg)  09/27/22 227 lb (103 kg)  06/19/22 227 lb 8 oz (103.2 kg)      Physical Exam Vitals and nursing note reviewed.  Constitutional:      Appearance: Normal appearance. She is obese.  Cardiovascular:     Rate and Rhythm: Normal rate and regular rhythm.     Heart sounds: Normal heart sounds.  Pulmonary:     Effort: Pulmonary effort is normal.     Breath sounds: Normal breath sounds.  Musculoskeletal:     Right lower leg: No edema.     Left lower leg: No edema.  Neurological:     General: No  focal deficit present.     Mental Status: She is alert.     Cranial Nerves: Cranial nerves 2-12 are intact.     Sensory: Sensation is intact.     Motor: Motor function is intact.     Gait: Gait is intact.     Deep Tendon Reflexes:     Reflex Scores:      Bicep reflexes are 2+ on the right side and 2+ on the left side.      Patellar reflexes are 2+ on the right side and 2+ on the left side.    Comments: Bilateral upper and lower extremity strength 5/5      No results found for any visits on 10/15/22.    The ASCVD Risk score (Arnett DK, et al., 2019) failed to calculate for the following reasons:   The 2019 ASCVD risk score is only valid for ages 74 to 56    Assessment & Plan:   Problem List Items Addressed This Visit       Other   Obesity (BMI 30-39.9)    Patient has been working on physical activity and dietary  changes without good result.  Did discuss the possibility of injectable drugs but they are on backorder currently do not feel comfortable writing phentermine with patient given her cardiac abnormalities.  Ambulatory referral was placed for healthy weight and wellness.      Relevant Orders   Amb Ref to Medical Weight Management   Status post placement of implantable loop recorder    Patient had an episode of syncope while wearing temporary heart monitor.  Did capture about 4-second sinus pause per patient report she does have a implanted loop recorder now along up with being followed by cardiology.      Tension headache - Primary    Was evaluated in urgent care and given Toradol, Phenergan.  Patient took at home Benadryl without relief.  She has been taking over-the-counter analgesics without relief.  Does sound tension like in nature will prescribe Flexeril 5 mg twice daily as needed, sedation precautions reviewed.  Follow-up if no improvement      Relevant Medications   cyclobenzaprine (FLEXERIL) 5 MG tablet    Return if symptoms worsen or fail to improve.    Audria Nine, NP

## 2022-10-15 NOTE — Assessment & Plan Note (Signed)
Patient has been working on physical activity and dietary changes without good result.  Did discuss the possibility of injectable drugs but they are on backorder currently do not feel comfortable writing phentermine with patient given her cardiac abnormalities.  Ambulatory referral was placed for healthy weight and wellness.

## 2022-10-15 NOTE — Assessment & Plan Note (Signed)
Was evaluated in urgent care and given Toradol, Phenergan.  Patient took at home Benadryl without relief.  She has been taking over-the-counter analgesics without relief.  Does sound tension like in nature will prescribe Flexeril 5 mg twice daily as needed, sedation precautions reviewed.  Follow-up if no improvement

## 2022-10-15 NOTE — Patient Instructions (Signed)
Nice to see you today I have sent in a muscle relaxer that can make you sleepy, use caution Follow up with me in May for your next physical, sooner if you need me

## 2022-10-15 NOTE — Assessment & Plan Note (Signed)
Patient had an episode of syncope while wearing temporary heart monitor.  Did capture about 4-second sinus pause per patient report she does have a implanted loop recorder now along up with being followed by cardiology.

## 2022-10-25 DIAGNOSIS — Z419 Encounter for procedure for purposes other than remedying health state, unspecified: Secondary | ICD-10-CM | POA: Diagnosis not present

## 2022-10-27 ENCOUNTER — Encounter: Payer: Self-pay | Admitting: Nurse Practitioner

## 2022-10-27 ENCOUNTER — Telehealth: Payer: 59 | Admitting: Family

## 2022-10-27 DIAGNOSIS — T300 Burn of unspecified body region, unspecified degree: Secondary | ICD-10-CM | POA: Diagnosis not present

## 2022-10-27 DIAGNOSIS — G90A Postural orthostatic tachycardia syndrome (POTS): Secondary | ICD-10-CM

## 2022-10-27 DIAGNOSIS — R55 Syncope and collapse: Secondary | ICD-10-CM
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
## 2022-10-27 NOTE — Telephone Encounter (Signed)
Added from additional message. Will send to PCP for review .   I made an appointment for a virtual visit this morning and they gave me a note.    Thank you

## 2022-10-27 NOTE — Telephone Encounter (Signed)
Added to additional message on same topic.  No further action needed at this time.

## 2022-11-14 ENCOUNTER — Encounter: Payer: Self-pay | Admitting: Nurse Practitioner

## 2022-11-14 ENCOUNTER — Ambulatory Visit (INDEPENDENT_AMBULATORY_CARE_PROVIDER_SITE_OTHER): Payer: 59 | Admitting: Nurse Practitioner

## 2022-11-14 VITALS — BP 98/62 | HR 88 | Temp 98.2°F | Resp 16 | Ht 67.75 in | Wt 238.5 lb

## 2022-11-14 DIAGNOSIS — G44201 Tension-type headache, unspecified, intractable: Secondary | ICD-10-CM | POA: Diagnosis not present

## 2022-11-14 DIAGNOSIS — Z95818 Presence of other cardiac implants and grafts: Secondary | ICD-10-CM | POA: Diagnosis not present

## 2022-11-14 DIAGNOSIS — R55 Syncope and collapse: Secondary | ICD-10-CM | POA: Diagnosis not present

## 2022-11-14 MED ORDER — DEXAMETHASONE SODIUM PHOSPHATE 10 MG/ML IJ SOLN
10.0000 mg | Freq: Once | INTRAMUSCULAR | Status: AC
  Administered 2022-11-14: 10 mg via INTRAMUSCULAR

## 2022-11-14 MED ORDER — SUMATRIPTAN SUCCINATE 25 MG PO TABS: 25.0000 mg | ORAL_TABLET | ORAL | 0 refills | Status: DC | PRN

## 2022-11-14 NOTE — Assessment & Plan Note (Signed)
Failed toradol, otc analgesics, promethazine, and flexeril. Will adminstered dexamethasone and imitrex. If no improvement obtain imaging and refer the neurology

## 2022-11-14 NOTE — Progress Notes (Signed)
Acute Office Visit  Subjective:     Patient ID: Leah Hartman, female    DOB: 06-Aug-1996, 26 y.o.   MRN: [id number]  Chief Complaint  Patient presents with   Headache    Had for a long time     Patient is in today for Headache  Patient was seen by me on 10/15/2022 dx with tension headache and prescribed. She had been seen at [facility] and was given promethazine and toradol with out relife. I did prescribe her flexeril and she is here for follow up   States that the flexeril did make her sleepy. States that she did wake up that night at midnight and head was hurting worse. States that she was suppose to go to dinner and did not go because the headache was worse. States that she had tried adivl and tylenol and ice pack. States that she is having light, sound, nuasea and vomiting. Global and all the time and never goes away just changed in intensiy States a squeezing in nature. States that she has not taken anything this week because it does not  help when she takes it.  Review of Systems  Constitutional:  Negative for chills and fever.  Respiratory:  Negative for cough.   Cardiovascular:  Negative for chest pain.  Neurological:  Positive for headaches. Negative for tingling and weakness.        Objective:    BP 98/62   Pulse 88   Temp 98.2 F (36.8 C)   Resp 16   Ht 5' 7.75" (1.721 m)   Wt 238 lb 8 oz (108.2 kg)   SpO2 99%   BMI 36.53 kg/m    Physical Exam Constitutional:      Appearance: She is well-developed.  HENT:     Right Ear: Tympanic membrane, ear canal and external ear normal.     Left Ear: Tympanic membrane, ear canal and external ear normal.     Mouth/Throat:     Mouth: Mucous membranes are moist.     Pharynx: Oropharynx is clear.  Eyes:     Extraocular Movements: Extraocular movements intact.     Pupils: Pupils are equal, round, and reactive to light.  Cardiovascular:     Rate and Rhythm: Normal rate and regular rhythm.     Heart sounds: Normal heart  sounds.  Pulmonary:     Breath sounds: Normal breath sounds.  Neurological:     General: No focal deficit present.     Mental Status: She is alert.     Comments: Bilateral upper and lower extremity strength 5/5     No results found for any visits on 11/14/22.      Assessment & Plan:   Problem List Items Addressed This Visit       Other   Intractable tension-type headache - Primary    Failed toradol, otc analgesics, promethazine, and flexeril. Will adminstered dexamethasone and imitrex. If no improvement obtain imaging and refer the neurology       Relevant Medications   SUMAtriptan (IMITREX) 25 MG tablet    Meds ordered this encounter  Medications   dexamethasone (DECADRON) injection 10 mg   SUMAtriptan (IMITREX) 25 MG tablet    Sig: Take 1 tablet (25 mg total) by mouth every 2 (two) hours as needed for migraine. May repeat in 2 hours if headache persists or recurs no more than 2 tablets in a 24 hour period    Dispense:  10 tablet    Refill:  0    Order Specific Question:   Supervising Provider    Answer:   Roxy Manns A [[id number]]    Return in about 6 months (around 05/16/2023) for CPE and labs.  Audria Nine, NP

## 2022-11-14 NOTE — Patient Instructions (Signed)
Nice to see you today If this does not make the headache go away let me know and we may need to get an image of your head and possibly see a neurologist

## 2022-11-19 ENCOUNTER — Encounter: Payer: Self-pay | Admitting: Nurse Practitioner

## 2022-11-19 MED ORDER — RIZATRIPTAN BENZOATE 5 MG PO TABS: 5.0000 mg | ORAL_TABLET | ORAL | 0 refills | Status: DC | PRN

## 2022-11-25 DIAGNOSIS — Z419 Encounter for procedure for purposes other than remedying health state, unspecified: Secondary | ICD-10-CM | POA: Diagnosis not present

## 2022-11-26 ENCOUNTER — Encounter: Payer: Self-pay | Admitting: Nurse Practitioner

## 2022-11-26 DIAGNOSIS — G44201 Tension-type headache, unspecified, intractable: Secondary | ICD-10-CM

## 2022-11-26 DIAGNOSIS — H539 Unspecified visual disturbance: Secondary | ICD-10-CM

## 2022-11-27 NOTE — Telephone Encounter (Signed)
noted 

## 2022-11-27 NOTE — Telephone Encounter (Signed)
I spoke with pt; pt said today the H/A and vision seems worse. Pt said she feels like a tight rubber band around her head and temples have sharpe pains; pain level now is 8. Pt said she is nauseated today but no vomiting. Pt does not want to eat but has been drinking Yeti cups of ice water. This morning the lt eye seemed more blurry. Pt has lightheadedness on and off. Pt said some noise such as vacuum cleaner noise really bothers her. Pt said no med seems to help and ice helps somewhat. Pt has not heard yet about imaging appt. Pt is going to try to rest and use ice for 15 - 20 mins wrapped in towel to see if that will help. Pt said she does not need to go to UC or ED at this point. Sending note to Romilda Garret NP and [facility]. UC & ED precautions given and pt voiced understanding. [facility] [address]

## 2022-11-28 ENCOUNTER — Telehealth: Payer: Self-pay | Admitting: Nurse Practitioner

## 2022-11-28 ENCOUNTER — Encounter: Payer: Self-pay | Admitting: Nurse Practitioner

## 2022-11-28 ENCOUNTER — Ambulatory Visit
Admission: RE | Admit: 2022-11-28 | Discharge: 2022-11-28 | Disposition: A | Discharge status: Home / Self Care | Payer: 59 | Source: Ambulatory Visit | Attending: Nurse Practitioner | Admitting: Nurse Practitioner

## 2022-11-28 ENCOUNTER — Ambulatory Visit: Payer: 59

## 2022-11-28 DIAGNOSIS — G44201 Tension-type headache, unspecified, intractable: Secondary | ICD-10-CM | POA: Diagnosis not present

## 2022-11-28 DIAGNOSIS — H539 Unspecified visual disturbance: Secondary | ICD-10-CM | POA: Insufficient documentation

## 2022-11-28 DIAGNOSIS — R519 Headache, unspecified: Secondary | ICD-10-CM | POA: Diagnosis not present

## 2022-11-28 NOTE — Telephone Encounter (Signed)
Dani from [facility] called stating the pt is scheduled for a "CT head w/o contrast" today, 1/4 @ 4pm. Danni stated the pt's secondary insurance needs a Authorization. Call back # [phone]

## 2022-11-29 ENCOUNTER — Ambulatory Visit (INDEPENDENT_AMBULATORY_CARE_PROVIDER_SITE_OTHER): Payer: 59 | Admitting: *Deleted

## 2022-11-29 DIAGNOSIS — Z23 Encounter for immunization: Secondary | ICD-10-CM | POA: Diagnosis not present

## 2022-11-29 MED ORDER — BUTALBITAL-APAP-CAFFEINE 50-325-40 MG PO TABS: 1.0000 | ORAL_TABLET | Freq: Four times a day (QID) | ORAL | 0 refills | Status: DC | PRN

## 2022-12-11 ENCOUNTER — Encounter: Payer: Self-pay | Admitting: Nurse Practitioner

## 2022-12-11 DIAGNOSIS — G44201 Tension-type headache, unspecified, intractable: Secondary | ICD-10-CM

## 2022-12-16 DIAGNOSIS — R55 Syncope and collapse: Secondary | ICD-10-CM | POA: Diagnosis not present

## 2022-12-16 DIAGNOSIS — Z95818 Presence of other cardiac implants and grafts: Secondary | ICD-10-CM | POA: Diagnosis not present

## 2022-12-17 ENCOUNTER — Other Ambulatory Visit: Payer: Self-pay | Admitting: Nurse Practitioner

## 2022-12-17 DIAGNOSIS — G44201 Tension-type headache, unspecified, intractable: Secondary | ICD-10-CM

## 2022-12-17 MED ORDER — RIZATRIPTAN BENZOATE 5 MG PO TABS: 5.0000 mg | ORAL_TABLET | ORAL | 0 refills | Status: DC | PRN

## 2022-12-26 DIAGNOSIS — Z419 Encounter for procedure for purposes other than remedying health state, unspecified: Secondary | ICD-10-CM | POA: Diagnosis not present

## 2023-01-04 DIAGNOSIS — Z95818 Presence of other cardiac implants and grafts: Secondary | ICD-10-CM | POA: Diagnosis not present

## 2023-01-04 DIAGNOSIS — R079 Chest pain, unspecified: Secondary | ICD-10-CM | POA: Diagnosis not present

## 2023-01-04 DIAGNOSIS — S299XXA Unspecified injury of thorax, initial encounter: Secondary | ICD-10-CM | POA: Diagnosis not present

## 2023-01-04 DIAGNOSIS — R0789 Other chest pain: Secondary | ICD-10-CM | POA: Diagnosis not present

## 2023-01-06 DIAGNOSIS — H539 Unspecified visual disturbance: Secondary | ICD-10-CM | POA: Diagnosis not present

## 2023-01-06 DIAGNOSIS — I959 Hypotension, unspecified: Secondary | ICD-10-CM | POA: Diagnosis not present

## 2023-01-06 DIAGNOSIS — F40298 Other specified phobia: Secondary | ICD-10-CM | POA: Diagnosis not present

## 2023-01-06 DIAGNOSIS — R519 Headache, unspecified: Secondary | ICD-10-CM | POA: Diagnosis not present

## 2023-01-08 DIAGNOSIS — H47333 Pseudopapilledema of optic disc, bilateral: Secondary | ICD-10-CM | POA: Diagnosis not present

## 2023-01-16 DIAGNOSIS — R55 Syncope and collapse: Secondary | ICD-10-CM | POA: Diagnosis not present

## 2023-01-16 DIAGNOSIS — Z95818 Presence of other cardiac implants and grafts: Secondary | ICD-10-CM | POA: Diagnosis not present

## 2023-01-22 ENCOUNTER — Other Ambulatory Visit: Payer: Self-pay | Admitting: Neurology

## 2023-01-22 DIAGNOSIS — G932 Benign intracranial hypertension: Secondary | ICD-10-CM

## 2023-01-24 DIAGNOSIS — Z419 Encounter for procedure for purposes other than remedying health state, unspecified: Secondary | ICD-10-CM | POA: Diagnosis not present

## 2023-02-03 DIAGNOSIS — G932 Benign intracranial hypertension: Secondary | ICD-10-CM | POA: Diagnosis not present

## 2023-02-03 DIAGNOSIS — R519 Headache, unspecified: Secondary | ICD-10-CM | POA: Diagnosis not present

## 2023-02-03 DIAGNOSIS — I959 Hypotension, unspecified: Secondary | ICD-10-CM | POA: Diagnosis not present

## 2023-02-03 DIAGNOSIS — H53149 Visual discomfort, unspecified: Secondary | ICD-10-CM | POA: Diagnosis not present

## 2023-02-03 DIAGNOSIS — H539 Unspecified visual disturbance: Secondary | ICD-10-CM | POA: Diagnosis not present

## 2023-02-03 DIAGNOSIS — F40298 Other specified phobia: Secondary | ICD-10-CM | POA: Diagnosis not present

## 2023-02-03 DIAGNOSIS — R4020 Unspecified coma: Secondary | ICD-10-CM | POA: Diagnosis not present

## 2023-02-05 NOTE — Progress Notes (Signed)
Patient for DG Lumbar Puncture on Thurs 02/06/2023, I called and spoke with the patient on the phone and gave pre-procedure instructions. Pt was made aware to be here at 8:30a. Pt stated understanding.  Called 01/23/2023 and LVM on 02/05/2023

## 2023-02-06 ENCOUNTER — Ambulatory Visit
Admission: RE | Admit: 2023-02-06 | Discharge: 2023-02-06 | Disposition: A | Discharge status: Home / Self Care | Payer: 59 | Source: Ambulatory Visit | Attending: Neurology | Admitting: Neurology

## 2023-02-06 VITALS — BP 127/86 | HR 97 | Temp 97.8°F | Resp 20 | Ht 69.0 in | Wt 242.0 lb

## 2023-02-06 DIAGNOSIS — G932 Benign intracranial hypertension: Secondary | ICD-10-CM | POA: Insufficient documentation

## 2023-02-06 DIAGNOSIS — H538 Other visual disturbances: Secondary | ICD-10-CM | POA: Diagnosis not present

## 2023-02-06 DIAGNOSIS — G44221 Chronic tension-type headache, intractable: Secondary | ICD-10-CM | POA: Diagnosis not present

## 2023-02-06 LAB — PREGNANCY, URINE: Preg Test, Ur: NEGATIVE

## 2023-02-06 MED ORDER — LIDOCAINE HCL (PF) 1 % IJ SOLN
10.0000 mL | Freq: Once | INTRAMUSCULAR | Status: AC
  Administered 2023-02-06: 5 mL via SUBCUTANEOUS
  Filled 2023-02-06: qty 10

## 2023-02-06 MED ORDER — ACETAMINOPHEN 500 MG PO TABS
ORAL_TABLET | ORAL | Status: AC
  Filled 2023-02-06: qty 2

## 2023-02-06 MED ORDER — ACETAMINOPHEN 500 MG PO TABS
1000.0000 mg | ORAL_TABLET | ORAL | Status: AC
  Administered 2023-02-06: 1000 mg via ORAL

## 2023-02-17 DIAGNOSIS — Z95818 Presence of other cardiac implants and grafts: Secondary | ICD-10-CM | POA: Diagnosis not present

## 2023-02-17 DIAGNOSIS — R55 Syncope and collapse: Secondary | ICD-10-CM | POA: Diagnosis not present

## 2023-02-27 ENCOUNTER — Ambulatory Visit (INDEPENDENT_AMBULATORY_CARE_PROVIDER_SITE_OTHER): Payer: 59 | Admitting: Nurse Practitioner

## 2023-02-27 ENCOUNTER — Encounter: Payer: Self-pay | Admitting: Nurse Practitioner

## 2023-02-27 VITALS — BP 108/68 | HR 92 | Temp 98.3°F | Resp 16 | Ht 69.0 in | Wt 244.5 lb

## 2023-02-27 DIAGNOSIS — F411 Generalized anxiety disorder: Secondary | ICD-10-CM | POA: Insufficient documentation

## 2023-02-27 DIAGNOSIS — R7303 Prediabetes: Secondary | ICD-10-CM

## 2023-02-27 DIAGNOSIS — E669 Obesity, unspecified: Secondary | ICD-10-CM | POA: Diagnosis not present

## 2023-02-27 MED ORDER — BUSPIRONE HCL 10 MG PO TABS: 10.0000 mg | ORAL_TABLET | Freq: Two times a day (BID) | ORAL | 1 refills | Status: DC

## 2023-02-27 MED ORDER — WEGOVY 0.25 MG/0.5ML ~~LOC~~ SOAJ: 0.2500 mg | SUBCUTANEOUS | 0 refills | Status: DC
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
## 2023-02-27 NOTE — Assessment & Plan Note (Signed)
Patient continues to work a lot so modifications we will see if [name] is a covered benefit.

## 2023-02-27 NOTE — Patient Instructions (Signed)
Nice to see you today I have increased the buspar to 10mg  twice a day I have sent in the wegovy also Keep your appointment with me as scheduled
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
## 2023-02-27 NOTE — Assessment & Plan Note (Signed)
Patient has tried diet and exercise without great relief.  Did encourage patient to do food journaling to make sure she is intaking enough calories to prevent her BMR from dropping.  Will see if [name] is a covered benefit under her insurance.  Patient denies history of medullary thyroid cancer, multiple endocrine neoplasia syndrome type II, pancreatitis.

## 2023-02-28 ENCOUNTER — Ambulatory Visit: Payer: 59

## 2023-03-05 ENCOUNTER — Telehealth: Payer: Self-pay

## 2023-03-05 ENCOUNTER — Encounter: Payer: Self-pay | Admitting: Nurse Practitioner

## 2023-03-05 ENCOUNTER — Other Ambulatory Visit (HOSPITAL_COMMUNITY): Payer: Self-pay

## 2023-03-05 NOTE — Telephone Encounter (Signed)
Has a PA been started for Leah Hartman?

## 2023-03-05 NOTE — Telephone Encounter (Signed)
Pharmacy Patient Advocate Encounter   Received notification that prior authorization for Wegovy 0.25MG /0.5ML auto-injectors is required/requested.  Per Test Claim: PA required   PA submitted on 03/05/23 to (ins) [employer] via [employer] (fax) Key or (Medicaid) confirmation # [facility] Status is pending

## 2023-03-05 NOTE — Telephone Encounter (Signed)
Noted  

## 2023-03-10 NOTE — Telephone Encounter (Signed)
Message sent to pt to inform her of this information.

## 2023-03-10 NOTE — Telephone Encounter (Signed)
Pharmacy Patient Advocate Encounter  Received notification from [employer] that the request for prior authorization for [facility] 0.25MG /0.5ML auto-injectors has been denied due to Plan exclusion.      Please be advised we currently do not have a Pharmacist to review denials, therefore you will need to process appeals accordingly as needed. Thanks for your support at this time.   You may fax [phone] to appeal.

## 2023-03-11 DIAGNOSIS — F40298 Other specified phobia: Secondary | ICD-10-CM | POA: Diagnosis not present

## 2023-03-11 DIAGNOSIS — H53149 Visual discomfort, unspecified: Secondary | ICD-10-CM | POA: Diagnosis not present

## 2023-03-11 DIAGNOSIS — H539 Unspecified visual disturbance: Secondary | ICD-10-CM | POA: Diagnosis not present

## 2023-03-11 DIAGNOSIS — R519 Headache, unspecified: Secondary | ICD-10-CM | POA: Diagnosis not present

## 2023-03-11 DIAGNOSIS — G932 Benign intracranial hypertension: Secondary | ICD-10-CM | POA: Diagnosis not present

## 2023-03-13 ENCOUNTER — Other Ambulatory Visit: Payer: Self-pay | Admitting: Nurse Practitioner

## 2023-03-13 ENCOUNTER — Encounter: Payer: Self-pay | Admitting: Nurse Practitioner

## 2023-03-13 DIAGNOSIS — F411 Generalized anxiety disorder: Secondary | ICD-10-CM

## 2023-03-17 NOTE — Telephone Encounter (Signed)
Refill for 90 day supply change request: busPIRone (BUSPAR) 10 MG tablet - Spoke with the patient and she is doing well with no side effects.  LR- 02/27/23 ( 60 tabs/ 1 refill) LV- 02/27/23 NV- 04/07/23

## 2023-03-20 DIAGNOSIS — G932 Benign intracranial hypertension: Secondary | ICD-10-CM | POA: Diagnosis not present

## 2023-03-20 DIAGNOSIS — R55 Syncope and collapse: Secondary | ICD-10-CM | POA: Diagnosis not present

## 2023-03-20 DIAGNOSIS — Z95818 Presence of other cardiac implants and grafts: Secondary | ICD-10-CM | POA: Diagnosis not present

## 2023-03-31 ENCOUNTER — Ambulatory Visit: Payer: 59

## 2023-03-31 ENCOUNTER — Ambulatory Visit (INDEPENDENT_AMBULATORY_CARE_PROVIDER_SITE_OTHER): Payer: 59

## 2023-03-31 DIAGNOSIS — Z23 Encounter for immunization: Secondary | ICD-10-CM | POA: Diagnosis not present

## 2023-04-07 ENCOUNTER — Encounter: Payer: BC Managed Care – PPO | Admitting: Nurse Practitioner

## 2023-04-22 DIAGNOSIS — Z95818 Presence of other cardiac implants and grafts: Secondary | ICD-10-CM | POA: Diagnosis not present

## 2023-04-22 DIAGNOSIS — R55 Syncope and collapse: Secondary | ICD-10-CM | POA: Diagnosis not present

## 2023-05-22 DIAGNOSIS — R0602 Shortness of breath: Secondary | ICD-10-CM | POA: Diagnosis not present

## 2023-05-22 DIAGNOSIS — R4020 Unspecified coma: Secondary | ICD-10-CM | POA: Diagnosis not present

## 2023-05-22 DIAGNOSIS — G90A Postural orthostatic tachycardia syndrome (POTS): Secondary | ICD-10-CM | POA: Diagnosis not present

## 2023-05-22 DIAGNOSIS — Z87898 Personal history of other specified conditions: Secondary | ICD-10-CM | POA: Diagnosis not present

## 2023-05-22 DIAGNOSIS — Z95818 Presence of other cardiac implants and grafts: Secondary | ICD-10-CM | POA: Diagnosis not present

## 2023-05-22 DIAGNOSIS — I471 Supraventricular tachycardia, unspecified: Secondary | ICD-10-CM | POA: Diagnosis not present

## 2023-05-22 DIAGNOSIS — R5382 Chronic fatigue, unspecified: Secondary | ICD-10-CM | POA: Diagnosis not present

## 2023-06-22 DIAGNOSIS — R0602 Shortness of breath: Secondary | ICD-10-CM | POA: Diagnosis not present

## 2023-06-22 DIAGNOSIS — Z87898 Personal history of other specified conditions: Secondary | ICD-10-CM | POA: Diagnosis not present

## 2023-06-22 DIAGNOSIS — R4020 Unspecified coma: Secondary | ICD-10-CM | POA: Diagnosis not present

## 2023-06-22 DIAGNOSIS — G90A Postural orthostatic tachycardia syndrome (POTS): Secondary | ICD-10-CM | POA: Diagnosis not present

## 2023-06-22 DIAGNOSIS — Z95818 Presence of other cardiac implants and grafts: Secondary | ICD-10-CM | POA: Diagnosis not present

## 2023-06-22 DIAGNOSIS — R5382 Chronic fatigue, unspecified: Secondary | ICD-10-CM | POA: Diagnosis not present

## 2023-06-22 DIAGNOSIS — I471 Supraventricular tachycardia, unspecified: Secondary | ICD-10-CM | POA: Diagnosis not present

## 2023-07-04 DIAGNOSIS — R55 Syncope and collapse: Secondary | ICD-10-CM | POA: Diagnosis not present

## 2023-07-04 DIAGNOSIS — Z008 Encounter for other general examination: Secondary | ICD-10-CM | POA: Diagnosis not present

## 2023-07-14 DIAGNOSIS — R070 Pain in throat: Secondary | ICD-10-CM | POA: Diagnosis not present

## 2023-07-14 DIAGNOSIS — J069 Acute upper respiratory infection, unspecified: Secondary | ICD-10-CM | POA: Diagnosis not present

## 2023-07-14 DIAGNOSIS — Z20822 Contact with and (suspected) exposure to covid-19: Secondary | ICD-10-CM | POA: Diagnosis not present

## 2023-07-23 DIAGNOSIS — R4020 Unspecified coma: Secondary | ICD-10-CM | POA: Diagnosis not present

## 2023-07-23 DIAGNOSIS — R0602 Shortness of breath: Secondary | ICD-10-CM | POA: Diagnosis not present

## 2023-07-23 DIAGNOSIS — R5382 Chronic fatigue, unspecified: Secondary | ICD-10-CM | POA: Diagnosis not present

## 2023-07-23 DIAGNOSIS — Z87898 Personal history of other specified conditions: Secondary | ICD-10-CM | POA: Diagnosis not present

## 2023-07-23 DIAGNOSIS — Z95818 Presence of other cardiac implants and grafts: Secondary | ICD-10-CM | POA: Diagnosis not present

## 2023-07-23 DIAGNOSIS — I471 Supraventricular tachycardia, unspecified: Secondary | ICD-10-CM | POA: Diagnosis not present

## 2023-07-23 DIAGNOSIS — G90A Postural orthostatic tachycardia syndrome (POTS): Secondary | ICD-10-CM | POA: Diagnosis not present

## 2023-08-06 DIAGNOSIS — I471 Supraventricular tachycardia, unspecified: Secondary | ICD-10-CM | POA: Diagnosis not present

## 2023-08-06 DIAGNOSIS — Z95818 Presence of other cardiac implants and grafts: Secondary | ICD-10-CM | POA: Diagnosis not present

## 2023-08-06 DIAGNOSIS — G90A Postural orthostatic tachycardia syndrome (POTS): Secondary | ICD-10-CM | POA: Diagnosis not present

## 2023-08-17 DIAGNOSIS — S39012A Strain of muscle, fascia and tendon of lower back, initial encounter: Secondary | ICD-10-CM | POA: Diagnosis not present

## 2023-08-23 DIAGNOSIS — R0602 Shortness of breath: Secondary | ICD-10-CM | POA: Diagnosis not present

## 2023-08-23 DIAGNOSIS — Z87898 Personal history of other specified conditions: Secondary | ICD-10-CM | POA: Diagnosis not present

## 2023-08-23 DIAGNOSIS — I471 Supraventricular tachycardia, unspecified: Secondary | ICD-10-CM | POA: Diagnosis not present

## 2023-08-23 DIAGNOSIS — R4020 Unspecified coma: Secondary | ICD-10-CM | POA: Diagnosis not present

## 2023-08-23 DIAGNOSIS — R5382 Chronic fatigue, unspecified: Secondary | ICD-10-CM | POA: Diagnosis not present

## 2023-08-23 DIAGNOSIS — G90A Postural orthostatic tachycardia syndrome (POTS): Secondary | ICD-10-CM | POA: Diagnosis not present

## 2023-08-23 DIAGNOSIS — Z95818 Presence of other cardiac implants and grafts: Secondary | ICD-10-CM | POA: Diagnosis not present

## 2023-09-23 DIAGNOSIS — Z87898 Personal history of other specified conditions: Secondary | ICD-10-CM | POA: Diagnosis not present

## 2023-09-23 DIAGNOSIS — R5382 Chronic fatigue, unspecified: Secondary | ICD-10-CM | POA: Diagnosis not present

## 2023-09-23 DIAGNOSIS — R0602 Shortness of breath: Secondary | ICD-10-CM | POA: Diagnosis not present

## 2023-09-23 DIAGNOSIS — R4020 Unspecified coma: Secondary | ICD-10-CM | POA: Diagnosis not present

## 2023-09-23 DIAGNOSIS — Z95818 Presence of other cardiac implants and grafts: Secondary | ICD-10-CM | POA: Diagnosis not present

## 2023-09-23 DIAGNOSIS — G90A Postural orthostatic tachycardia syndrome (POTS): Secondary | ICD-10-CM | POA: Diagnosis not present

## 2023-09-23 DIAGNOSIS — I471 Supraventricular tachycardia, unspecified: Secondary | ICD-10-CM | POA: Diagnosis not present

## 2023-10-24 DIAGNOSIS — R0602 Shortness of breath: Secondary | ICD-10-CM | POA: Diagnosis not present

## 2023-10-24 DIAGNOSIS — G90A Postural orthostatic tachycardia syndrome (POTS): Secondary | ICD-10-CM | POA: Diagnosis not present

## 2023-10-24 DIAGNOSIS — Z95818 Presence of other cardiac implants and grafts: Secondary | ICD-10-CM | POA: Diagnosis not present

## 2023-10-24 DIAGNOSIS — R4020 Unspecified coma: Secondary | ICD-10-CM | POA: Diagnosis not present

## 2023-10-24 DIAGNOSIS — R5382 Chronic fatigue, unspecified: Secondary | ICD-10-CM | POA: Diagnosis not present

## 2023-10-24 DIAGNOSIS — I471 Supraventricular tachycardia, unspecified: Secondary | ICD-10-CM | POA: Diagnosis not present

## 2023-10-24 DIAGNOSIS — Z87898 Personal history of other specified conditions: Secondary | ICD-10-CM | POA: Diagnosis not present

## 2023-10-26 DIAGNOSIS — K219 Gastro-esophageal reflux disease without esophagitis: Secondary | ICD-10-CM | POA: Diagnosis not present

## 2023-10-26 DIAGNOSIS — G90A Postural orthostatic tachycardia syndrome (POTS): Secondary | ICD-10-CM | POA: Diagnosis not present

## 2023-10-26 DIAGNOSIS — F419 Anxiety disorder, unspecified: Secondary | ICD-10-CM | POA: Diagnosis not present

## 2023-10-26 DIAGNOSIS — M549 Dorsalgia, unspecified: Secondary | ICD-10-CM | POA: Diagnosis not present

## 2023-10-26 DIAGNOSIS — R1013 Epigastric pain: Secondary | ICD-10-CM | POA: Diagnosis not present

## 2023-10-26 DIAGNOSIS — R1011 Right upper quadrant pain: Secondary | ICD-10-CM | POA: Diagnosis not present

## 2023-10-26 DIAGNOSIS — Z79899 Other long term (current) drug therapy: Secondary | ICD-10-CM | POA: Diagnosis not present

## 2023-10-26 DIAGNOSIS — R1010 Upper abdominal pain, unspecified: Secondary | ICD-10-CM | POA: Diagnosis not present

## 2023-10-26 DIAGNOSIS — F32A Depression, unspecified: Secondary | ICD-10-CM | POA: Diagnosis not present

## 2023-10-26 DIAGNOSIS — R112 Nausea with vomiting, unspecified: Secondary | ICD-10-CM | POA: Diagnosis not present

## 2023-10-26 HISTORY — PX: CHOLECYSTECTOMY: SHX55

## 2023-10-28 ENCOUNTER — Inpatient Hospital Stay: Payer: 59 | Admitting: Nurse Practitioner

## 2023-10-28 ENCOUNTER — Ambulatory Visit: Payer: Self-pay | Admitting: Surgery

## 2023-10-28 DIAGNOSIS — K8050 Calculus of bile duct without cholangitis or cholecystitis without obstruction: Secondary | ICD-10-CM | POA: Diagnosis not present

## 2023-10-28 NOTE — H&P (Signed)
Subjective:   CC: Biliary colic [K80.50]  HPI:  Leah Hartman is a 27 y.o. female who presents for evaluation of above CC. Symptoms were first noted several months ago. Pain is sharp, intermittent, and recurrent, radiating from the right lower quadrant, to the between shoulders.  Associated with N/V, exacerbated by fatty food     Past Medical History:  has a past medical history of Dysautonomia (CMS/HHS-HCC), Endometriosis, Ovarian cyst, and POTS (postural orthostatic tachycardia syndrome).  Past Surgical History:  has a past surgical history that includes Tonsillectomy & Adenoidectomy; Appendectomy; Removal Ovarian Cyst; and Laparoscopic endometriosis fulguration.  Family History: family history includes Diabetes insipidus in her mother; Diabetes type I in her father; High blood pressure (Hypertension) in her mother.  Social History:  reports that she has never smoked. She has never used smokeless tobacco. She reports that she does not drink alcohol and does not use drugs.  Current Medications: has a current medication list which includes the following prescription(s): levonorgestrel.  Allergies:  Allergies as of 10/28/2023   (No Known Allergies)    ROS:  A 15 point review of systems was performed and pertinent positives and negatives noted in HPI    Objective:     BP 102/78   Pulse 85   Ht 175.3 cm (5' 9.02")   Wt (!) 112.9 kg (248 lb 14.4 oz)   BMI 36.74 kg/m    Constitutional :  No distress, cooperative, alert  Lymphatics/Throat:  Supple with no lymphadenopathy  Respiratory:  Clear to auscultation bilaterally  Cardiovascular:  Regular rate and rhythm  Gastrointestinal: Soft, minimal TTP RUQ and flank, non-distended, no organomegaly.  Musculoskeletal: Steady gait and movement  Skin: Cool and moist  Psychiatric: Normal affect, non-agitated, not confused       LABS:  N/a   RADS: N/a Assessment:      Biliary colic [K80.50]  Plan:     1. Biliary colic  [K80.50] Discussed the risk of surgery including post-op infxn, seroma, biloma, chronic pain, poor-delayed wound healing, retained gallstone, conversion to open procedure, post-op SBO or ileus, and need for additional procedures to address said risks.  The risks of general anesthetic including MI, CVA, sudden death or even reaction to anesthetic medications also discussed. Alternatives include continued observation.  Benefits include possible symptom relief, prevention of complications including acute cholecystitis, pancreatitis.  Typical post operative recovery of 3-5 days rest, continued pain in area and incision sites, possible loose stools up to 4-6 weeks, also discussed.  ED return precautions given for sudden increase in RUQ pain, with possible accompanying fever, nausea, and/or vomiting.  The patient understands the risks, any and all questions were answered to the patient's satisfaction.  2. Patient has elected to proceed with surgical treatment. Procedure will be scheduled.  Written consent was obtained..robotic assisted laparoscopic  labs/images/medications/previous chart entries reviewed personally and relevant changes/updates noted above.

## 2023-10-28 NOTE — H&P (View-Only) (Signed)
 Subjective:   CC: Biliary colic [K80.50]  HPI:  Leah Hartman is a 27 y.o. female who presents for evaluation of above CC. Symptoms were first noted several months ago. Pain is sharp, intermittent, and recurrent, radiating from the right lower quadrant, to the between shoulders.  Associated with N/V, exacerbated by fatty food     Past Medical History:  has a past medical history of Dysautonomia (CMS/HHS-HCC), Endometriosis, Ovarian cyst, and POTS (postural orthostatic tachycardia syndrome).  Past Surgical History:  has a past surgical history that includes Tonsillectomy & Adenoidectomy; Appendectomy; Removal Ovarian Cyst; and Laparoscopic endometriosis fulguration.  Family History: family history includes Diabetes insipidus in her mother; Diabetes type I in her father; High blood pressure (Hypertension) in her mother.  Social History:  reports that she has never smoked. She has never used smokeless tobacco. She reports that she does not drink alcohol and does not use drugs.  Current Medications: has a current medication list which includes the following prescription(s): levonorgestrel.  Allergies:  Allergies as of 10/28/2023   (No Known Allergies)    ROS:  A 15 point review of systems was performed and pertinent positives and negatives noted in HPI    Objective:     BP 102/78   Pulse 85   Ht 175.3 cm (5' 9.02")   Wt (!) 112.9 kg (248 lb 14.4 oz)   BMI 36.74 kg/m    Constitutional :  No distress, cooperative, alert  Lymphatics/Throat:  Supple with no lymphadenopathy  Respiratory:  Clear to auscultation bilaterally  Cardiovascular:  Regular rate and rhythm  Gastrointestinal: Soft, minimal TTP RUQ and flank, non-distended, no organomegaly.  Musculoskeletal: Steady gait and movement  Skin: Cool and moist  Psychiatric: Normal affect, non-agitated, not confused       LABS:  N/a   RADS: N/a Assessment:      Biliary colic [K80.50]  Plan:     1. Biliary colic  [K80.50] Discussed the risk of surgery including post-op infxn, seroma, biloma, chronic pain, poor-delayed wound healing, retained gallstone, conversion to open procedure, post-op SBO or ileus, and need for additional procedures to address said risks.  The risks of general anesthetic including MI, CVA, sudden death or even reaction to anesthetic medications also discussed. Alternatives include continued observation.  Benefits include possible symptom relief, prevention of complications including acute cholecystitis, pancreatitis.  Typical post operative recovery of 3-5 days rest, continued pain in area and incision sites, possible loose stools up to 4-6 weeks, also discussed.  ED return precautions given for sudden increase in RUQ pain, with possible accompanying fever, nausea, and/or vomiting.  The patient understands the risks, any and all questions were answered to the patient's satisfaction.  2. Patient has elected to proceed with surgical treatment. Procedure will be scheduled.  Written consent was obtained..robotic assisted laparoscopic  labs/images/medications/previous chart entries reviewed personally and relevant changes/updates noted above.

## 2023-10-30 ENCOUNTER — Other Ambulatory Visit: Payer: Self-pay

## 2023-10-30 ENCOUNTER — Encounter
Admission: RE | Admit: 2023-10-30 | Discharge: 2023-10-30 | Disposition: A | Discharge status: Home / Self Care | Payer: 59 | Source: Ambulatory Visit | Attending: Surgery | Admitting: Surgery

## 2023-10-30 VITALS — Ht 69.0 in | Wt 258.0 lb

## 2023-10-30 DIAGNOSIS — Z01818 Encounter for other preprocedural examination: Secondary | ICD-10-CM

## 2023-10-30 HISTORY — DX: Radiculopathy, lumbar region: M54.16

## 2023-10-30 HISTORY — DX: Interstitial cystitis (chronic) without hematuria: N30.10

## 2023-10-30 NOTE — Patient Instructions (Signed)
Your procedure is scheduled on: Thursday 11/06/23 To find out your arrival time, please call [phone] between 1PM - 3PM on:   Wednesday 11/05/23 Report to the Registration Desk on the 1st floor of the [facility]. Free Valet parking is available.  If your arrival time is 6:00 am, do not arrive before that time as the [facility] entrance doors do not open until 6:00 am.  REMEMBER: Instructions that are not followed completely may result in serious medical risk, up to and including death; or upon the discretion of your surgeon and anesthesiologist your surgery may need to be rescheduled.  Do not eat food after midnight the night before surgery.  No gum chewing or hard candies.  You may however, drink CLEAR liquids up to 2 hours before you are scheduled to arrive for your surgery. Do not drink anything within 2 hours of your scheduled arrival time.  Clear liquids include: - water  - apple juice without pulp - gatorade (not RED colors) - black coffee or tea (Do NOT add milk or creamers to the coffee or tea) Do NOT drink anything that is not on this list.  Type 1 and Type 2 diabetics should only drink water.  One week prior to surgery: Stop Anti-inflammatories (NSAIDS) such as Advil, Aleve, Ibuprofen, Motrin, Naproxen, Naprosyn and Aspirin based products such as Excedrin, Goody's Powder, BC Powder. You may however, continue to take Tylenol if needed for pain up until the day of surgery.  Stop ANY OVER THE COUNTER supplements and vitamins for 7 days until after surgery.  Continue taking all prescribed medications.   TAKE ONLY THESE MEDICATIONS THE MORNING OF SURGERY WITH A SIP OF WATER:  none  No Alcohol for 24 hours before or after surgery.  No Smoking including e-cigarettes for 24 hours before surgery.  No chewable tobacco products for at least 6 hours before surgery.  No nicotine patches on the day of surgery.  Do not use any "recreational" drugs for at least a week  (preferably 2 weeks) before your surgery.  Please be advised that the combination of cocaine and anesthesia may have negative outcomes, up to and including death. If you test positive for cocaine, your surgery will be cancelled.  On the morning of surgery brush your teeth with toothpaste and water, you may rinse your mouth with mouthwash if you wish. Do not swallow any toothpaste or mouthwash.  Use CHG Soap or wipes as directed on instruction sheet.  Do not wear lotions, powders, or perfumes.   Do not shave body hair from the neck down 48 hours before surgery.  Wear comfortable clothing (specific to your surgery type) to the hospital.  Do not wear jewelry, make-up, hairpins, clips or nail polish.  For welded (permanent) jewelry: bracelets, anklets, waist bands, etc.  Please have this removed prior to surgery.  If it is not removed, there is a chance that hospital personnel will need to cut it off on the day of surgery. Contact lenses, hearing aids and dentures may not be worn into surgery.  Do not bring valuables to the hospital. [facility] is not responsible for any missing/lost belongings or valuables.   Notify your doctor if there is any change in your medical condition (cold, fever, infection).  If you are being discharged the day of surgery, you will not be allowed to drive home. You will need a responsible individual to drive you home and stay with you for 24 hours after surgery.   If  you are taking public transportation, you will need to have a responsible individual with you.  If you are being admitted to the hospital overnight, leave your suitcase in the car. After surgery it may be brought to your room.  In case of increased patient census, it may be necessary for you, the patient, to continue your postoperative care in the Same Day Surgery department.  After surgery, you can help prevent lung complications by doing breathing exercises.  Take deep breaths and cough every  1-2 hours. Your doctor may order a device called an Incentive Spirometer to help you take deep breaths. When coughing or sneezing, hold a pillow firmly against your incision with both hands. This is called "splinting." Doing this helps protect your incision. It also decreases belly discomfort.  Surgery Visitation Policy:  Patients undergoing a surgery or procedure may have two family members or support persons with them as long as the person is not COVID-19 positive or experiencing its symptoms.   Inpatient Visitation:    Visiting hours are 7 a.m. to 8 p.m. Up to four visitors are allowed at one time in a patient room. The visitors may rotate out with other people during the day. One designated support person (adult) may remain overnight.  Please call the Pre-admissions Testing Dept. at [phone] if you have any questions about these instructions.     Preparing for Surgery with CHLORHEXIDINE GLUCONATE (CHG) Soap  Chlorhexidine Gluconate (CHG) Soap  o An antiseptic cleaner that kills germs and bonds with the skin to continue killing germs even after washing  o Used for showering the night before surgery and morning of surgery  Before surgery, you can play an important role by reducing the number of germs on your skin.  CHG (Chlorhexidine gluconate) soap is an antiseptic cleanser which kills germs and bonds with the skin to continue killing germs even after washing.  Please do not use if you have an allergy to CHG or antibacterial soaps. If your skin becomes reddened/irritated stop using the CHG.  1. Shower the NIGHT BEFORE SURGERY and the MORNING OF SURGERY with CHG soap.  2. If you choose to wash your hair, wash your hair first as usual with your normal shampoo.  3. After shampooing, rinse your hair and body thoroughly to remove the shampoo.  4. Use CHG as you would any other liquid soap. You can apply CHG directly to the skin and wash gently with a scrungie or a clean  washcloth.  5. Apply the CHG soap to your body only from the neck down. Do not use on open wounds or open sores. Avoid contact with your eyes, ears, mouth, and genitals (private parts). Wash face and genitals (private parts) with your normal soap.  6. Wash thoroughly, paying special attention to the area where your surgery will be performed.  7. Thoroughly rinse your body with warm water.  8. Do not shower/wash with your normal soap after using and rinsing off the CHG soap.  9. Pat yourself dry with a clean towel.  10. Wear clean pajamas to bed the night before surgery.  12. Place clean sheets on your bed the night of your first shower and do not sleep with pets.  13. Shower again with the CHG soap on the day of surgery prior to arriving at the hospital.  14. Do not apply any deodorants/lotions/powders.  15. Please wear clean clothes to the hospital.

## 2023-11-06 ENCOUNTER — Ambulatory Visit: Payer: 59 | Admitting: Anesthesiology

## 2023-11-06 ENCOUNTER — Encounter
Admission: RE | Disposition: A | Discharge status: Home / Self Care | Payer: Self-pay | Source: Home / Self Care | Attending: Surgery

## 2023-11-06 ENCOUNTER — Other Ambulatory Visit: Payer: Self-pay

## 2023-11-06 ENCOUNTER — Ambulatory Visit
Admission: RE | Admit: 2023-11-06 | Discharge: 2023-11-06 | Disposition: A | Discharge status: Home / Self Care | Payer: 59 | Attending: Surgery | Admitting: Surgery

## 2023-11-06 ENCOUNTER — Encounter: Payer: Self-pay | Admitting: Surgery

## 2023-11-06 DIAGNOSIS — Z87891 Personal history of nicotine dependence: Secondary | ICD-10-CM | POA: Insufficient documentation

## 2023-11-06 DIAGNOSIS — K8050 Calculus of bile duct without cholangitis or cholecystitis without obstruction: Secondary | ICD-10-CM | POA: Diagnosis not present

## 2023-11-06 DIAGNOSIS — Z01818 Encounter for other preprocedural examination: Secondary | ICD-10-CM

## 2023-11-06 DIAGNOSIS — K819 Cholecystitis, unspecified: Secondary | ICD-10-CM | POA: Diagnosis not present

## 2023-11-06 LAB — POCT PREGNANCY, URINE: Preg Test, Ur: NEGATIVE

## 2023-11-06 SURGERY — CHOLECYSTECTOMY, ROBOT-ASSISTED, LAPAROSCOPIC
Anesthesia: General | Site: Abdomen

## 2023-11-06 MED ORDER — CHLORHEXIDINE GLUCONATE 0.12 % MT SOLN
15.0000 mL | Freq: Once | OROMUCOSAL | Status: AC
  Administered 2023-11-06: 15 mL via OROMUCOSAL

## 2023-11-06 MED ORDER — IBUPROFEN 800 MG PO TABS: 800.0000 mg | ORAL_TABLET | Freq: Three times a day (TID) | ORAL | 0 refills | Status: DC | PRN

## 2023-11-06 MED ORDER — ORAL CARE MOUTH RINSE: 15.0000 mL | Freq: Once | OROMUCOSAL | Status: AC

## 2023-11-06 MED ORDER — HYDROMORPHONE HCL 1 MG/ML IJ SOLN
INTRAMUSCULAR | Status: AC
  Filled 2023-11-06: qty 1

## 2023-11-06 MED ORDER — PHENYLEPHRINE 80 MCG/ML (10ML) SYRINGE FOR IV PUSH (FOR BLOOD PRESSURE SUPPORT)
PREFILLED_SYRINGE | INTRAVENOUS | Status: DC | PRN
  Administered 2023-11-06 (×2): 160 ug via INTRAVENOUS

## 2023-11-06 MED ORDER — EPHEDRINE SULFATE-NACL 50-0.9 MG/10ML-% IV SOSY
PREFILLED_SYRINGE | INTRAVENOUS | Status: DC | PRN
  Administered 2023-11-06: 5 mg via INTRAVENOUS

## 2023-11-06 MED ORDER — DEXMEDETOMIDINE HCL IN NACL 200 MCG/50ML IV SOLN
INTRAVENOUS | Status: DC | PRN
  Administered 2023-11-06: 8 ug via INTRAVENOUS
  Administered 2023-11-06 (×2): 12 ug via INTRAVENOUS

## 2023-11-06 MED ORDER — PROPOFOL 1000 MG/100ML IV EMUL
INTRAVENOUS | Status: AC
  Filled 2023-11-06: qty 200

## 2023-11-06 MED ORDER — ACETAMINOPHEN 10 MG/ML IV SOLN
INTRAVENOUS | Status: AC
  Filled 2023-11-06: qty 100

## 2023-11-06 MED ORDER — KETOROLAC TROMETHAMINE 30 MG/ML IJ SOLN
INTRAMUSCULAR | Status: AC
  Filled 2023-11-06: qty 1

## 2023-11-06 MED ORDER — OXYCODONE HCL 5 MG/5ML PO SOLN: 5.0000 mg | Freq: Once | ORAL | Status: AC | PRN

## 2023-11-06 MED ORDER — CHLORHEXIDINE GLUCONATE CLOTH 2 % EX PADS
6.0000 | MEDICATED_PAD | Freq: Once | CUTANEOUS | Status: AC
  Administered 2023-11-06: 6 via TOPICAL

## 2023-11-06 MED ORDER — SUCCINYLCHOLINE CHLORIDE 200 MG/10ML IV SOSY
PREFILLED_SYRINGE | INTRAVENOUS | Status: DC | PRN
  Administered 2023-11-06: 100 mg via INTRAVENOUS

## 2023-11-06 MED ORDER — LIDOCAINE-EPINEPHRINE (PF) 1 %-1:200000 IJ SOLN
INTRAMUSCULAR | Status: AC
  Filled 2023-11-06: qty 30

## 2023-11-06 MED ORDER — FENTANYL CITRATE (PF) 100 MCG/2ML IJ SOLN
INTRAMUSCULAR | Status: AC
  Filled 2023-11-06: qty 2

## 2023-11-06 MED ORDER — OXYCODONE HCL 5 MG PO TABS
5.0000 mg | ORAL_TABLET | Freq: Once | ORAL | Status: AC | PRN
  Administered 2023-11-06: 5 mg via ORAL

## 2023-11-06 MED ORDER — PROPOFOL 10 MG/ML IV BOLUS
INTRAVENOUS | Status: DC | PRN
  Administered 2023-11-06: 200 mg via INTRAVENOUS

## 2023-11-06 MED ORDER — GLYCOPYRROLATE 0.2 MG/ML IJ SOLN
INTRAMUSCULAR | Status: DC | PRN
  Administered 2023-11-06: .2 mg via INTRAVENOUS

## 2023-11-06 MED ORDER — DEXAMETHASONE SODIUM PHOSPHATE 10 MG/ML IJ SOLN
INTRAMUSCULAR | Status: DC | PRN
  Administered 2023-11-06: 10 mg via INTRAVENOUS

## 2023-11-06 MED ORDER — ESMOLOL HCL 100 MG/10ML IV SOLN
INTRAVENOUS | Status: DC | PRN
  Administered 2023-11-06: 10 mg via INTRAVENOUS

## 2023-11-06 MED ORDER — CEFAZOLIN SODIUM-DEXTROSE 2-4 GM/100ML-% IV SOLN
2.0000 g | INTRAVENOUS | Status: AC
Start: 2023-11-06 — End: 2023-11-06
  Administered 2023-11-06: 2 g via INTRAVENOUS

## 2023-11-06 MED ORDER — ACETAMINOPHEN 325 MG PO TABS: 650.0000 mg | ORAL_TABLET | Freq: Three times a day (TID) | ORAL | 0 refills | Status: AC | PRN

## 2023-11-06 MED ORDER — MIDAZOLAM HCL 2 MG/2ML IJ SOLN
INTRAMUSCULAR | Status: DC | PRN
  Administered 2023-11-06: 2 mg via INTRAVENOUS

## 2023-11-06 MED ORDER — LACTATED RINGERS IV SOLN: INTRAVENOUS | Status: DC

## 2023-11-06 MED ORDER — FENTANYL CITRATE (PF) 100 MCG/2ML IJ SOLN
INTRAMUSCULAR | Status: DC | PRN
  Administered 2023-11-06: 50 ug via INTRAVENOUS
  Administered 2023-11-06: 100 ug via INTRAVENOUS
  Administered 2023-11-06: 50 ug via INTRAVENOUS

## 2023-11-06 MED ORDER — INDOCYANINE GREEN 25 MG IV SOLR
1.2500 mg | Freq: Once | INTRAVENOUS | Status: AC
  Administered 2023-11-06: 1.25 mg via INTRAVENOUS
  Filled 2023-11-06: qty 0.5

## 2023-11-06 MED ORDER — SUGAMMADEX SODIUM 200 MG/2ML IV SOLN
INTRAVENOUS | Status: DC | PRN
  Administered 2023-11-06: 200 mg via INTRAVENOUS

## 2023-11-06 MED ORDER — ACETAMINOPHEN 10 MG/ML IV SOLN
INTRAVENOUS | Status: DC | PRN
  Administered 2023-11-06: 1000 mg via INTRAVENOUS

## 2023-11-06 MED ORDER — HYDROCODONE-ACETAMINOPHEN 5-325 MG PO TABS: 1.0000 | ORAL_TABLET | Freq: Four times a day (QID) | ORAL | 0 refills | Status: DC | PRN

## 2023-11-06 MED ORDER — CEFAZOLIN SODIUM-DEXTROSE 2-4 GM/100ML-% IV SOLN
INTRAVENOUS | Status: AC
  Filled 2023-11-06: qty 100

## 2023-11-06 MED ORDER — ROCURONIUM BROMIDE 100 MG/10ML IV SOLN
INTRAVENOUS | Status: DC | PRN
  Administered 2023-11-06: 50 mg via INTRAVENOUS
  Administered 2023-11-06: 30 mg via INTRAVENOUS
  Administered 2023-11-06: 10 mg via INTRAVENOUS
  Administered 2023-11-06: 20 mg via INTRAVENOUS

## 2023-11-06 MED ORDER — ONDANSETRON HCL 4 MG/2ML IJ SOLN
INTRAMUSCULAR | Status: DC | PRN
  Administered 2023-11-06 (×2): 4 mg via INTRAVENOUS

## 2023-11-06 MED ORDER — MIDAZOLAM HCL 2 MG/2ML IJ SOLN
INTRAMUSCULAR | Status: AC
Start: 2023-11-06 — End: ?
  Filled 2023-11-06: qty 2

## 2023-11-06 MED ORDER — DOCUSATE SODIUM 100 MG PO CAPS: 100.0000 mg | ORAL_CAPSULE | Freq: Two times a day (BID) | ORAL | 0 refills | Status: AC | PRN

## 2023-11-06 MED ORDER — HYDROMORPHONE HCL 1 MG/ML IJ SOLN
0.2500 mg | INTRAMUSCULAR | Status: DC | PRN
  Administered 2023-11-06: 0.5 mg via INTRAVENOUS
  Administered 2023-11-06: 0.25 mg via INTRAVENOUS
  Administered 2023-11-06 (×2): 0.5 mg via INTRAVENOUS
  Administered 2023-11-06: 0.25 mg via INTRAVENOUS

## 2023-11-06 MED ORDER — OXYCODONE HCL 5 MG PO TABS
ORAL_TABLET | ORAL | Status: AC
  Filled 2023-11-06: qty 1

## 2023-11-06 MED ORDER — CHLORHEXIDINE GLUCONATE 0.12 % MT SOLN
OROMUCOSAL | Status: AC
  Filled 2023-11-06: qty 15

## 2023-11-06 MED ORDER — LIDOCAINE-EPINEPHRINE (PF) 1 %-1:200000 IJ SOLN
INTRAMUSCULAR | Status: DC | PRN
  Administered 2023-11-06: 45 mL via SUBCUTANEOUS

## 2023-11-06 MED ORDER — KETOROLAC TROMETHAMINE 30 MG/ML IJ SOLN
30.0000 mg | Freq: Once | INTRAMUSCULAR | Status: AC
Start: 2023-11-06 — End: 2023-11-06
  Administered 2023-11-06: 30 mg via INTRAVENOUS

## 2023-11-06 MED ORDER — BUPIVACAINE HCL (PF) 0.5 % IJ SOLN
INTRAMUSCULAR | Status: AC
  Filled 2023-11-06: qty 30

## 2023-11-06 MED ORDER — LIDOCAINE HCL (CARDIAC) PF 100 MG/5ML IV SOSY
PREFILLED_SYRINGE | INTRAVENOUS | Status: DC | PRN
  Administered 2023-11-06: 100 mg via INTRAVENOUS

## 2023-11-06 SURGICAL SUPPLY — 45 items
ANCHOR TIS RET SYS 235ML (MISCELLANEOUS) ×1 IMPLANT
BAG PRESSURE INF REUSE 1000 (BAG) IMPLANT
BLADE SURG SZ11 CARB STEEL (BLADE) ×1 IMPLANT
CATH REDDICK CHOLANGI 4FR 50CM (CATHETERS) IMPLANT
CAUTERY HOOK MNPLR 1.6 DVNC XI (INSTRUMENTS) ×1 IMPLANT
CLIP LIGATING HEMO O LOK GREEN (MISCELLANEOUS) ×1 IMPLANT
DERMABOND ADVANCED .7 DNX12 (GAUZE/BANDAGES/DRESSINGS) ×1 IMPLANT
DRAPE ARM DVNC X/XI (DISPOSABLE) ×4 IMPLANT
DRAPE C-ARM XRAY 36X54 (DRAPES) IMPLANT
DRAPE COLUMN DVNC XI (DISPOSABLE) ×1 IMPLANT
ELECT REM PT RETURN 9FT ADLT (ELECTROSURGICAL) ×1
ELECTRODE REM PT RTRN 9FT ADLT (ELECTROSURGICAL) ×1 IMPLANT
FORCEPS BPLR FENES DVNC XI (FORCEP) ×1 IMPLANT
FORCEPS PROGRASP DVNC XI (FORCEP) ×1 IMPLANT
GLOVE BIOGEL PI IND STRL 7.0 (GLOVE) ×2 IMPLANT
GLOVE SURG SYN 6.5 ES PF (GLOVE) ×2 IMPLANT
GLOVE SURG SYN 6.5 PF PI (GLOVE) ×2 IMPLANT
GOWN STRL REUS W/ TWL LRG LVL3 (GOWN DISPOSABLE) ×3 IMPLANT
GRASPER SUT TROCAR 14GX15 (MISCELLANEOUS) IMPLANT
IRRIGATOR SUCT 8 DISP DVNC XI (IRRIGATION / IRRIGATOR) IMPLANT
IV NS 1000ML BAXH (IV SOLUTION) IMPLANT
KIT TURNOVER KIT A (KITS) ×1 IMPLANT
LABEL OR SOLS (LABEL) ×1 IMPLANT
MANIFOLD NEPTUNE II (INSTRUMENTS) ×1 IMPLANT
NDL HYPO 22X1.5 SAFETY MO (MISCELLANEOUS) ×1 IMPLANT
NDL INSUFFLATION 14GA 120MM (NEEDLE) ×1 IMPLANT
NEEDLE HYPO 22X1.5 SAFETY MO (MISCELLANEOUS) ×1 IMPLANT
NEEDLE INSUFFLATION 14GA 120MM (NEEDLE) ×1 IMPLANT
NS IRRIG 500ML POUR BTL (IV SOLUTION) ×1 IMPLANT
OBTURATOR OPTICAL LONG 8 DVNC (TROCAR) IMPLANT
OBTURATOR OPTICAL STND 8 DVNC (TROCAR) ×1
OBTURATOR OPTICALSTD 8 DVNC (TROCAR) ×1 IMPLANT
PACK LAP CHOLECYSTECTOMY (MISCELLANEOUS) ×1 IMPLANT
SEAL UNIV 5-12 XI (MISCELLANEOUS) ×4 IMPLANT
SET TUBE SMOKE EVAC HIGH FLOW (TUBING) ×1 IMPLANT
SOL ELECTROSURG ANTI STICK (MISCELLANEOUS) ×1
SOLUTION ELECTROSURG ANTI STCK (MISCELLANEOUS) ×1 IMPLANT
SPIKE FLUID TRANSFER (MISCELLANEOUS) ×2 IMPLANT
SUT MNCRL 4-0 27XMFL (SUTURE) ×2
SUT VICRYL 0 UR6 27IN ABS (SUTURE) ×1 IMPLANT
SUTURE MNCRL 4-0 27XMF (SUTURE) ×2 IMPLANT
SYR 30ML LL (SYRINGE) IMPLANT
SYSTEM WECK SHIELD CLOSURE (TROCAR) IMPLANT
TRAP FLUID SMOKE EVACUATOR (MISCELLANEOUS) ×1 IMPLANT
WATER STERILE IRR 500ML POUR (IV SOLUTION) ×1 IMPLANT

## 2023-11-06 NOTE — Interval H&P Note (Signed)
No change. OK to proceed.

## 2023-11-06 NOTE — Anesthesia Postprocedure Evaluation (Signed)
Anesthesia Post Note  Patient: Chinda Shinabery  Procedure(s) Performed: XI ROBOTIC ASSISTED LAPAROSCOPIC CHOLECYSTECTOMY (Abdomen)  Patient location during evaluation: PACU Anesthesia Type: General Level of consciousness: awake and alert Pain management: pain level controlled Vital Signs Assessment: post-procedure vital signs reviewed and stable Respiratory status: spontaneous breathing, nonlabored ventilation, respiratory function stable and patient connected to nasal cannula oxygen Cardiovascular status: blood pressure returned to baseline and stable Postop Assessment: no apparent nausea or vomiting Anesthetic complications: no   No notable events documented.   Last Vitals:  Vitals:   11/06/23 0930 11/06/23 0935  BP: 94/60   Pulse: 82 70  Resp: 18 12  Temp:  (P) 36.9 C  SpO2: 96% 94%    Last Pain:  Vitals:   11/06/23 0935  TempSrc:   PainSc: 4                  Louie Boston

## 2023-11-06 NOTE — Anesthesia Procedure Notes (Signed)
Procedure Name: Intubation Date/Time: 11/06/2023 7:35 AM  Performed by: Mohammed Kindle, CRNAPre-anesthesia Checklist: Patient identified, Emergency Drugs available, Suction available and Patient being monitored Patient Re-evaluated:Patient Re-evaluated prior to induction Oxygen Delivery Method: Circle system utilized Preoxygenation: Pre-oxygenation with 100% oxygen Induction Type: IV induction Ventilation: Mask ventilation without difficulty Laryngoscope Size: McGrath and 3 Grade View: Grade I Tube type: Oral Tube size: 7.0 mm Number of attempts: 1 Airway Equipment and Method: Stylet Placement Confirmation: ETT inserted through vocal cords under direct vision, positive ETCO2 and breath sounds checked- equal and bilateral Secured at: 21 cm Tube secured with: Tape Dental Injury: Teeth and Oropharynx as per pre-operative assessment

## 2023-11-06 NOTE — Discharge Instructions (Signed)
Laparoscopic Cholecystectomy, Care After This sheet gives you information about how to care for yourself after your procedure. Your doctor may also give you more specific instructions. If you have problems or questions, contact your doctor. Follow these instructions at home: Care for cuts from surgery (incisions)  Follow instructions from your doctor about how to take care of your cuts from surgery. Make sure you: Wash your hands with soap and water before you change your bandage (dressing). If you cannot use soap and water, use hand sanitizer. Change your bandage as told by your doctor. Leave stitches (sutures), skin glue, or skin tape (adhesive) strips in place. They may need to stay in place for 2 weeks or longer. If tape strips get loose and curl up, you may trim the loose edges. Do not remove tape strips completely unless your doctor says it is okay. Do not take baths, swim, or use a hot tub until your doctor says it is okay. OK TO SHOWER 24HRS AFTER YOUR SURGERY.  Check your surgical cut area every day for signs of infection. Check for: More redness, swelling, or pain. More fluid or blood. Warmth. Pus or a bad smell. Activity Do not drive or use heavy machinery while taking prescription pain medicine. Do not play contact sports until your doctor says it is okay. Do not drive for 24 hours if you were given a medicine to help you relax (sedative). Rest as needed. Do not return to work or school until your doctor says it is okay. General instructions  tylenol and advil as needed for discomfort.  Please alternate between the two every four hours as needed for pain.    Use narcotics, if prescribed, only when tylenol and motrin is not enough to control pain.  325-650mg every 8hrs to max of 3000mg/24hrs (including the 325mg in every norco dose) for the tylenol.    Advil up to 800mg per dose every 8hrs as needed for pain.   To prevent or treat constipation while you are taking prescription  pain medicine, your doctor may recommend that you: Drink enough fluid to keep your pee (urine) clear or pale yellow. Take over-the-counter or prescription medicines. Eat foods that are high in fiber, such as fresh fruits and vegetables, whole grains, and beans. Limit foods that are high in fat and processed sugars, such as fried and sweet foods. Contact a doctor if: You develop a rash. You have more redness, swelling, or pain around your surgical cuts. You have more fluid or blood coming from your surgical cuts. Your surgical cuts feel warm to the touch. You have pus or a bad smell coming from your surgical cuts. You have a fever. One or more of your surgical cuts breaks open. You have trouble breathing. You have chest pain. You have pain that is getting worse in your shoulders. You faint or feel dizzy when you stand. You have very bad pain in your belly (abdomen). You are sick to your stomach (nauseous) for more than one day. You have throwing up (vomiting) that lasts for more than one day. You have leg pain. This information is not intended to replace advice given to you by your health care provider. Make sure you discuss any questions you have with your health care provider. Document Released: 08/20/2008 Document Revised: 06/01/2016 Document Reviewed: 04/29/2016 Elsevier Interactive Patient Education  2019 [employer]  

## 2023-11-06 NOTE — Transfer of Care (Addendum)
Immediate Anesthesia Transfer of Care Note  Patient: Leah Hartman  Procedure(s) Performed: XI ROBOTIC ASSISTED LAPAROSCOPIC CHOLECYSTECTOMY (Abdomen)  Patient Location: PACU  Anesthesia Type:General  Level of Consciousness: awake, drowsy, and patient cooperative  Airway & Oxygen Therapy: Patient Spontanous Breathing and Patient connected to face mask oxygen  Post-op Assessment: Report given to RN and Post -op Vital signs reviewed and stable  Post vital signs: Reviewed and stable  Last Vitals:  Vitals Value Taken Time  BP 101/53 11/06/23 0900  Temp 36.9 C 11/06/23 0857  Pulse 71 11/06/23 0903  Resp 12 11/06/23 0903  SpO2 98 % 11/06/23 0903  Vitals shown include unfiled device data.  Last Pain:  Vitals:   11/06/23 0857  TempSrc:   PainSc: Asleep         Complications: No notable events documented.

## 2023-11-06 NOTE — Anesthesia Preprocedure Evaluation (Signed)
Anesthesia Evaluation  Patient identified by MRN, date of birth, ID band Patient awake    Reviewed: Allergy & Precautions, NPO status , Patient's Chart, lab work & pertinent test results  History of Anesthesia Complications Negative for: history of anesthetic complications  Airway Mallampati: III  TM Distance: >3 FB Neck ROM: full    Dental no notable dental hx.    Pulmonary neg pulmonary ROS, former smoker   Pulmonary exam normal        Cardiovascular Normal cardiovascular exam  POTS   Neuro/Psych  Headaches PSYCHIATRIC DISORDERS Anxiety Depression     Neuromuscular disease    GI/Hepatic Neg liver ROS,GERD  ,,  Endo/Other  negative endocrine ROS    Renal/GU      Musculoskeletal   Abdominal   Peds  Hematology negative hematology ROS (+)   Anesthesia Other Findings Past Medical History: No date: Abdominal pain No date: Anxiety No date: Depression 05/10/2015: Dysmenorrhea No date: Endometriosis No date: Gastritis     Comment:  egd 04/07/15  No date: GERD (gastroesophageal reflux disease) No date: Interstitial cystitis No date: Lumbar radiculopathy No date: Nausea & vomiting No date: Ovarian cyst No date: Ovarian cyst rupture No date: POTS (postural orthostatic tachycardia syndrome) No date: Syncope and collapse No date: Vitamin D deficiency  Past Surgical History: No date: APPENDECTOMY 04/07/2015: ESOPHAGOGASTRODUODENOSCOPY; N/A     Comment:  Procedure: ESOPHAGOGASTRODUODENOSCOPY (EGD);  Surgeon:               Midge Minium, MD;  Location: [facility];                Service: Gastroenterology;  Laterality: N/A; 05/15/2015: INTRAUTERINE DEVICE (IUD) INSERTION; N/A     Comment:  Procedure: INTRAUTERINE DEVICE (IUD) INSERTION;                Surgeon: Herold Harms, MD;  Location: [facility];                Service: Gynecology;  Laterality: N/A; 05/15/2015: LAPAROSCOPY; N/A     Comment:   Procedure: with excision and fulgeration of               endometriosis;  Surgeon: Herold Harms, MD;                Location: [facility];  Service: Gynecology;  Laterality:               N/A;  with excision and fulgeration of endometriosis 09/10/2022: loop monitor No date: OVARIAN CYST SURGERY; Bilateral No date: TONSILLECTOMY AND ADENOIDECTOMY No date: TYMPANOSTOMY TUBE PLACEMENT  BMI    Body Mass Index: 38.10 kg/m      Reproductive/Obstetrics negative OB ROS                             Anesthesia Physical Anesthesia Plan  ASA: 2  Anesthesia Plan: General ETT   Post-op Pain Management: Toradol IV (intra-op)*, Ofirmev IV (intra-op)* and Ketamine IV*   Induction: Intravenous  PONV Risk Score and Plan: 3 and Ondansetron, Dexamethasone, Midazolam and Treatment may vary due to age or medical condition  Airway Management Planned: Oral ETT  Additional Equipment:   Intra-op Plan:   Post-operative Plan: Extubation in OR  Informed Consent: I have reviewed the patients History and Physical, chart, labs and discussed the procedure including the risks, benefits and alternatives for the proposed anesthesia with the patient or authorized representative who has  indicated his/her understanding and acceptance.     Dental Advisory Given  Plan Discussed with: Anesthesiologist, CRNA and Surgeon  Anesthesia Plan Comments: (Patient consented for risks of anesthesia including but not limited to:  - adverse reactions to medications - damage to eyes, teeth, lips or other oral mucosa - nerve damage due to positioning  - sore throat or hoarseness - Damage to heart, brain, nerves, lungs, other parts of body or loss of life  Patient voiced understanding and assent.)        Anesthesia Quick Evaluation

## 2023-11-06 NOTE — Op Note (Signed)
Preoperative diagnosis:  biliary colic  Postoperative diagnosis: same as above  Procedure: Robotic assisted Laparoscopic Cholecystectomy.   Anesthesia: GETA   Surgeon: Sung Amabile  Specimen: Gallbladder  Complications: None  EBL: 15mL  Wound Classification: Clean Contaminated  Indications: see HPI  Findings: Critical view of safety noted Cystic duct and artery identified, ligated and divided, clips remained intact at end of procedure Adequate hemostasis  Description of procedure:  The patient was placed on the operating table in the supine position. SCDs placed, pre-op abx administered.  General anesthesia was induced and OG tube placed by anesthesia. A time-out was completed verifying correct patient, procedure, site, positioning, and implant(s) and/or special equipment prior to beginning this procedure. The abdomen was prepped and draped in the usual sterile fashion.    Veress needle was placed at the Palmer's point.  Positive saline drop test unable to be obtained so 5 mm port placed through same spot after removal of needle and abdomen and entered under direct visualization.  Insufflation initiated to 15 mmHg with no sudden increase in pressure.  Examination of the abdominal cavity noted no injuries during placement.  Ports then placed in the following locations under direct visualization: a 8 mm port  under umbilicus measured 20mm from gallbladder.   One 12 mm patient left of the umbilicus, 8cm from the periumbilical port, one 8 mm port placed to the patient right of the umbilical port 8 cm apart.  1 additional 8 mm port placed lateral to the 12mm port.  Once ports were placed, The table was placed in the reverse Trendelenburg position with the right side up. The Xi platform was brought into the operative field and docked to the ports successfully.  An endoscope was placed through the umbilical port, fenestrated grasper through the adjacent patient right port, prograsp to the far  patient left port, and then a hook cautery in the left port.  The dome of the gallbladder was grasped with prograsp, passed and retracted over the dome of the liver. Adhesions between the gallbladder and omentum, duodenum and transverse colon were lysed via hook cautery. The infundibulum was grasped with the fenestrated grasper and retracted toward the right lower quadrant. This maneuver exposed Calot's triangle. The peritoneum overlying the gallbladder infundibulum was then dissected  and the cystic duct and cystic artery identified.  Critical view of safety with the liver bed clearly visible behind the duct and artery with no additional structures noted.  The cystic duct and cystic artery clipped and divided close to the gallbladder.     The gallbladder was then dissected from its peritoneal and liver bed attachments by electrocautery. Hemostasis was checked prior to removing the hook cautery and the Endo Catch bag was then placed through the 12 mm port and the gallbladder was removed.  The gallbladder was passed off the table as a specimen. There was no evidence of bleeding from the gallbladder fossa or cystic artery or leakage of the bile from the cystic duct stump. The 12 mm port site not closed due to the extreme oblique angle of the initial course was placed.  Abdomen desufflated and secondary trocars were removed under direct vision. No bleeding was noted. All skin incisions then closed with subcuticular sutures of 4-0 monocryl and dressed with topical skin adhesive. The orogastric tube was removed and patient extubated.  The patient tolerated the procedure well and was taken to the postanesthesia care unit in stable condition.  All sponge and instrument count correct at end of  procedure.

## 2023-11-07 LAB — SURGICAL PATHOLOGY

## 2023-11-11 DIAGNOSIS — G90A Postural orthostatic tachycardia syndrome (POTS): Secondary | ICD-10-CM | POA: Diagnosis not present

## 2023-11-25 ENCOUNTER — Encounter: Payer: Self-pay | Admitting: Surgery

## 2023-11-26 DIAGNOSIS — I471 Supraventricular tachycardia, unspecified: Secondary | ICD-10-CM | POA: Diagnosis not present

## 2023-11-26 DIAGNOSIS — R5382 Chronic fatigue, unspecified: Secondary | ICD-10-CM | POA: Diagnosis not present

## 2023-11-26 DIAGNOSIS — G90A Postural orthostatic tachycardia syndrome (POTS): Secondary | ICD-10-CM | POA: Diagnosis not present

## 2023-11-26 DIAGNOSIS — R0602 Shortness of breath: Secondary | ICD-10-CM | POA: Diagnosis not present

## 2023-11-26 DIAGNOSIS — Z95818 Presence of other cardiac implants and grafts: Secondary | ICD-10-CM | POA: Diagnosis not present

## 2023-11-26 DIAGNOSIS — Z87898 Personal history of other specified conditions: Secondary | ICD-10-CM | POA: Diagnosis not present

## 2023-11-26 DIAGNOSIS — R4020 Unspecified coma: Secondary | ICD-10-CM | POA: Diagnosis not present

## 2023-12-11 DIAGNOSIS — J069 Acute upper respiratory infection, unspecified: Secondary | ICD-10-CM | POA: Diagnosis not present

## 2023-12-27 DIAGNOSIS — G90A Postural orthostatic tachycardia syndrome (POTS): Secondary | ICD-10-CM | POA: Diagnosis not present

## 2023-12-27 DIAGNOSIS — R5382 Chronic fatigue, unspecified: Secondary | ICD-10-CM | POA: Diagnosis not present

## 2023-12-27 DIAGNOSIS — R0602 Shortness of breath: Secondary | ICD-10-CM | POA: Diagnosis not present

## 2023-12-27 DIAGNOSIS — R4020 Unspecified coma: Secondary | ICD-10-CM | POA: Diagnosis not present

## 2023-12-27 DIAGNOSIS — I471 Supraventricular tachycardia, unspecified: Secondary | ICD-10-CM | POA: Diagnosis not present

## 2023-12-27 DIAGNOSIS — Z95818 Presence of other cardiac implants and grafts: Secondary | ICD-10-CM | POA: Diagnosis not present

## 2023-12-27 DIAGNOSIS — Z87898 Personal history of other specified conditions: Secondary | ICD-10-CM | POA: Diagnosis not present

## 2024-01-27 DIAGNOSIS — R0602 Shortness of breath: Secondary | ICD-10-CM | POA: Diagnosis not present

## 2024-01-27 DIAGNOSIS — R4020 Unspecified coma: Secondary | ICD-10-CM | POA: Diagnosis not present

## 2024-01-27 DIAGNOSIS — R5382 Chronic fatigue, unspecified: Secondary | ICD-10-CM | POA: Diagnosis not present

## 2024-01-27 DIAGNOSIS — Z87898 Personal history of other specified conditions: Secondary | ICD-10-CM | POA: Diagnosis not present

## 2024-01-27 DIAGNOSIS — G90A Postural orthostatic tachycardia syndrome (POTS): Secondary | ICD-10-CM | POA: Diagnosis not present

## 2024-01-27 DIAGNOSIS — Z95818 Presence of other cardiac implants and grafts: Secondary | ICD-10-CM | POA: Diagnosis not present

## 2024-01-27 DIAGNOSIS — I471 Supraventricular tachycardia, unspecified: Secondary | ICD-10-CM | POA: Diagnosis not present

## 2024-01-29 ENCOUNTER — Encounter: Payer: Self-pay | Admitting: Radiology

## 2024-01-29 ENCOUNTER — Ambulatory Visit (INDEPENDENT_AMBULATORY_CARE_PROVIDER_SITE_OTHER): Payer: 59 | Admitting: Radiology

## 2024-01-29 VITALS — BP 100/60 | HR 96 | Wt 259.0 lb

## 2024-01-29 DIAGNOSIS — Z3169 Encounter for other general counseling and advice on procreation: Secondary | ICD-10-CM

## 2024-01-29 NOTE — Progress Notes (Signed)
   Leah Hartman 04-22-1996 [id number]   History:  28 y.o. G0 presents for preconception counseling. Getting married 05/08/24, plans to try for pregnancy soon after they come back from their honeymoon.  Gynecologic History No LMP recorded. (Menstrual status: IUD).   Contraception/Family planning: IUD Sexually active: yes Last Pap: 11/23 LSIL   Obstetric History OB History  Gravida Para Term Preterm AB Living  0 0 0 0 0 0  SAB IAB Ectopic Multiple Live Births  0 0 0 0 0       02/27/2023    3:23 PM 10/15/2022   10:26 AM 06/03/2022   10:04 AM  Depression screen PHQ 2/9  Decreased Interest 3 2 0  Down, Depressed, Hopeless 2 1 1   PHQ - 2 Score 5 3 1   Altered sleeping 3 1 1   Tired, decreased energy 2 2 0  Change in appetite 1 2 0  Feeling bad or failure about yourself  2 1 0  Trouble concentrating 2 2 0  Moving slowly or fidgety/restless 1 0 0  Suicidal thoughts 0 0 0  PHQ-9 Score 16 11 2   Difficult doing work/chores Somewhat difficult Somewhat difficult Not difficult at all     The following portions of the patient's history were reviewed and updated as appropriate: allergies, current medications, past family history, past medical history, past social history, past surgical history, and problem list.  Review of Systems  All other systems reviewed and are negative.   Past medical history, past surgical history, family history and social history were all reviewed and documented in the EPIC chart.  Exam:  Vitals:   01/29/24 0849  BP: 100/60  Pulse: 96  SpO2: 97%  Weight: 259 lb (117.5 kg)   Body mass index is 38.25 kg/m.  Physical Exam Constitutional:      Appearance: Normal appearance. She is obese.  Pulmonary:     Effort: Pulmonary effort is normal.  Neurological:     Mental Status: She is alert.  Psychiatric:        Mood and Affect: Mood normal.        Thought Content: Thought content normal.        Judgment: Judgment normal.      Assessment/Plan:    1. Pre-conception counseling (Primary) Will plan for IUD removal and AEX when she returns from her honeymoon. In the meantime will begin a daily PNV.    Return in about 3 months (around 04/30/2024) for Annual and IUD removal.  Bailie Christenbury B WHNP-BC 9:00 AM 01/29/2024

## 2024-02-27 DIAGNOSIS — R4020 Unspecified coma: Secondary | ICD-10-CM | POA: Diagnosis not present

## 2024-02-27 DIAGNOSIS — R0602 Shortness of breath: Secondary | ICD-10-CM | POA: Diagnosis not present

## 2024-02-27 DIAGNOSIS — R5382 Chronic fatigue, unspecified: Secondary | ICD-10-CM | POA: Diagnosis not present

## 2024-02-27 DIAGNOSIS — I471 Supraventricular tachycardia, unspecified: Secondary | ICD-10-CM | POA: Diagnosis not present

## 2024-02-27 DIAGNOSIS — Z87898 Personal history of other specified conditions: Secondary | ICD-10-CM | POA: Diagnosis not present

## 2024-02-27 DIAGNOSIS — Z95818 Presence of other cardiac implants and grafts: Secondary | ICD-10-CM | POA: Diagnosis not present

## 2024-02-27 DIAGNOSIS — G90A Postural orthostatic tachycardia syndrome (POTS): Secondary | ICD-10-CM | POA: Diagnosis not present

## 2024-03-29 DIAGNOSIS — Z95818 Presence of other cardiac implants and grafts: Secondary | ICD-10-CM | POA: Diagnosis not present

## 2024-03-29 DIAGNOSIS — R0602 Shortness of breath: Secondary | ICD-10-CM | POA: Diagnosis not present

## 2024-03-29 DIAGNOSIS — G90A Postural orthostatic tachycardia syndrome (POTS): Secondary | ICD-10-CM | POA: Diagnosis not present

## 2024-03-29 DIAGNOSIS — Z87898 Personal history of other specified conditions: Secondary | ICD-10-CM | POA: Diagnosis not present

## 2024-03-29 DIAGNOSIS — I471 Supraventricular tachycardia, unspecified: Secondary | ICD-10-CM | POA: Diagnosis not present

## 2024-03-29 DIAGNOSIS — R4020 Unspecified coma: Secondary | ICD-10-CM | POA: Diagnosis not present

## 2024-03-29 DIAGNOSIS — R5382 Chronic fatigue, unspecified: Secondary | ICD-10-CM | POA: Diagnosis not present

## 2024-05-02 DIAGNOSIS — Z87898 Personal history of other specified conditions: Secondary | ICD-10-CM | POA: Diagnosis not present

## 2024-05-02 DIAGNOSIS — R5382 Chronic fatigue, unspecified: Secondary | ICD-10-CM | POA: Diagnosis not present

## 2024-05-02 DIAGNOSIS — G90A Postural orthostatic tachycardia syndrome (POTS): Secondary | ICD-10-CM | POA: Diagnosis not present

## 2024-05-02 DIAGNOSIS — R0602 Shortness of breath: Secondary | ICD-10-CM | POA: Diagnosis not present

## 2024-05-02 DIAGNOSIS — Z95818 Presence of other cardiac implants and grafts: Secondary | ICD-10-CM | POA: Diagnosis not present

## 2024-05-02 DIAGNOSIS — I471 Supraventricular tachycardia, unspecified: Secondary | ICD-10-CM | POA: Diagnosis not present

## 2024-05-02 DIAGNOSIS — R4020 Unspecified coma: Secondary | ICD-10-CM | POA: Diagnosis not present

## 2024-06-02 ENCOUNTER — Ambulatory Visit (INDEPENDENT_AMBULATORY_CARE_PROVIDER_SITE_OTHER): Admitting: Radiology

## 2024-06-02 ENCOUNTER — Encounter: Payer: Self-pay | Admitting: Radiology

## 2024-06-02 ENCOUNTER — Other Ambulatory Visit (HOSPITAL_COMMUNITY)
Admission: RE | Admit: 2024-06-02 | Discharge: 2024-06-02 | Disposition: A | Discharge status: Home / Self Care | Source: Ambulatory Visit | Attending: Radiology | Admitting: Radiology

## 2024-06-02 VITALS — BP 110/60 | HR 82 | Ht 68.7 in | Wt 267.6 lb

## 2024-06-02 DIAGNOSIS — Z01419 Encounter for gynecological examination (general) (routine) without abnormal findings: Secondary | ICD-10-CM | POA: Diagnosis not present

## 2024-06-02 DIAGNOSIS — R0602 Shortness of breath: Secondary | ICD-10-CM | POA: Diagnosis not present

## 2024-06-02 DIAGNOSIS — R5382 Chronic fatigue, unspecified: Secondary | ICD-10-CM | POA: Diagnosis not present

## 2024-06-02 DIAGNOSIS — Z3169 Encounter for other general counseling and advice on procreation: Secondary | ICD-10-CM

## 2024-06-02 DIAGNOSIS — Z30432 Encounter for removal of intrauterine contraceptive device: Secondary | ICD-10-CM

## 2024-06-02 DIAGNOSIS — Z1331 Encounter for screening for depression: Secondary | ICD-10-CM | POA: Diagnosis not present

## 2024-06-02 DIAGNOSIS — Z87898 Personal history of other specified conditions: Secondary | ICD-10-CM | POA: Diagnosis not present

## 2024-06-02 DIAGNOSIS — Z95818 Presence of other cardiac implants and grafts: Secondary | ICD-10-CM | POA: Diagnosis not present

## 2024-06-02 DIAGNOSIS — G90A Postural orthostatic tachycardia syndrome (POTS): Secondary | ICD-10-CM | POA: Diagnosis not present

## 2024-06-02 DIAGNOSIS — I471 Supraventricular tachycardia, unspecified: Secondary | ICD-10-CM | POA: Diagnosis not present

## 2024-06-02 DIAGNOSIS — R4020 Unspecified coma: Secondary | ICD-10-CM | POA: Diagnosis not present

## 2024-06-02 NOTE — Progress Notes (Signed)
 Leah Hartman 23-Jan-1996 [id number]   History:  28 y.o. G0 presents for annual exam. No gyn concerns. Would like IUD removed, married last month, planning for pregnancy. Has started PNV.  Consent signed, time out completed.  Gynecologic History No LMP recorded. (Menstrual status: IUD).   Contraception/Family planning: IUD Sexually active: yes Last Pap: 2023. Results were: abnormal, LSIL   Obstetric History OB History  Gravida Para Term Preterm AB Living  0 0 0 0 0 0  SAB IAB Ectopic Multiple Live Births  0 0 0 0 0       06/02/2024    8:06 AM 02/27/2023    3:23 PM 10/15/2022   10:26 AM  Depression screen PHQ 2/9  Decreased Interest 0 3 2  Down, Depressed, Hopeless 0 2 1  PHQ - 2 Score 0 5 3  Altered sleeping  3 1  Tired, decreased energy  2 2  Change in appetite  1 2  Feeling bad or failure about yourself   2 1  Trouble concentrating  2 2  Moving slowly or fidgety/restless  1 0  Suicidal thoughts  0 0  PHQ-9 Score  16 11  Difficult doing work/chores  Somewhat difficult Somewhat difficult     The following portions of the patient's history were reviewed and updated as appropriate: allergies, current medications, past family history, past medical history, past social history, past surgical history, and problem list.  ROS  Past medical history, past surgical history, family history and social history were all reviewed and documented in the EPIC chart.  Exam:  Vitals:   06/02/24 0807  BP: 110/60  Pulse: 82  SpO2: 98%  Weight: 267 lb 9.6 oz (121.4 kg)  Height: 5' 8.7 (1.745 m)   Body mass index is 39.86 kg/m.  Physical Exam Vitals and nursing note reviewed. Exam conducted with a chaperone present.  Constitutional:      Appearance: Normal appearance. She is overweight.  HENT:     Head: Normocephalic and atraumatic.  Neck:     Thyroid : No thyroid  mass, thyromegaly or thyroid  tenderness.  Cardiovascular:     Rate and Rhythm: Regular rhythm.     Heart  sounds: Normal heart sounds.  Pulmonary:     Effort: Pulmonary effort is normal.     Breath sounds: Normal breath sounds.  Chest:  Breasts:    Breasts are symmetrical.     Right: Normal. No inverted nipple, mass, nipple discharge, skin change or tenderness.     Left: Normal. No inverted nipple, mass, nipple discharge, skin change or tenderness.  Abdominal:     General: Abdomen is flat. Bowel sounds are normal.     Palpations: Abdomen is soft.  Genitourinary:    General: Normal vulva.     Vagina: Normal. No vaginal discharge, bleeding or lesions.     Cervix: Normal. No discharge or lesion.     Uterus: Normal. Not enlarged and not tender.      Adnexa: Right adnexa normal and left adnexa normal.       Right: No mass, tenderness or fullness.         Left: No mass, tenderness or fullness.       Comments: IUD removed easily with forceps. Long pederson used. Lymphadenopathy:     Upper Body:     Right upper body: No axillary adenopathy.     Left upper body: No axillary adenopathy.  Skin:    General: Skin is warm and dry.  Neurological:  Mental Status: She is alert and oriented to person, place, and time.  Psychiatric:        Mood and Affect: Mood normal.        Thought Content: Thought content normal.        Judgment: Judgment normal.      Darice Hoit, CMA present for exam  Assessment/Plan:   1. Well woman exam with routine gynecological exam (Primary) - Cytology - PAP( [place])  2. Encounter for IUD removal Tolerated well, IUD removed easily  3. Depression screening negative  4. Pre-conception counseling Continue PNV Establish with OB after positive UPT     Return in about 1 year (around 06/02/2025) for Annual.  GINETTE COZIER B WHNP-BC 8:51 AM 06/02/2024

## 2024-06-02 NOTE — Patient Instructions (Signed)

## 2024-06-08 ENCOUNTER — Ambulatory Visit: Payer: Self-pay | Admitting: Radiology

## 2024-06-08 LAB — CYTOLOGY - PAP: Diagnosis: NEGATIVE

## 2024-07-05 DIAGNOSIS — R5382 Chronic fatigue, unspecified: Secondary | ICD-10-CM | POA: Diagnosis not present

## 2024-07-05 DIAGNOSIS — R4020 Unspecified coma: Secondary | ICD-10-CM | POA: Diagnosis not present

## 2024-07-05 DIAGNOSIS — G90A Postural orthostatic tachycardia syndrome (POTS): Secondary | ICD-10-CM | POA: Diagnosis not present

## 2024-07-05 DIAGNOSIS — Z95818 Presence of other cardiac implants and grafts: Secondary | ICD-10-CM | POA: Diagnosis not present

## 2024-07-05 DIAGNOSIS — Z87898 Personal history of other specified conditions: Secondary | ICD-10-CM | POA: Diagnosis not present

## 2024-07-05 DIAGNOSIS — R0602 Shortness of breath: Secondary | ICD-10-CM | POA: Diagnosis not present

## 2024-07-05 DIAGNOSIS — I471 Supraventricular tachycardia, unspecified: Secondary | ICD-10-CM | POA: Diagnosis not present

## 2024-07-30 ENCOUNTER — Ambulatory Visit
Admission: EM | Admit: 2024-07-30 | Discharge: 2024-07-30 | Disposition: A | Discharge status: Home / Self Care | Attending: Emergency Medicine | Admitting: Emergency Medicine

## 2024-07-30 DIAGNOSIS — R112 Nausea with vomiting, unspecified: Secondary | ICD-10-CM

## 2024-07-30 DIAGNOSIS — Z3201 Encounter for pregnancy test, result positive: Secondary | ICD-10-CM | POA: Diagnosis not present

## 2024-07-30 DIAGNOSIS — Z3A01 Less than 8 weeks gestation of pregnancy: Secondary | ICD-10-CM | POA: Diagnosis not present

## 2024-07-30 LAB — POCT URINE PREGNANCY: Preg Test, Ur: POSITIVE — AB

## 2024-07-30 NOTE — ED Triage Notes (Signed)
 Patient states that she's here for a pregnancy test, patient took 3 at home test that were positive. LMP 8/8. No concerns.
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
## 2024-07-30 NOTE — Discharge Instructions (Addendum)
 Your pregnancy test is positive.    See the attached handout on first trimester pregnancy.    Do not take any over-the-counter medications without approval of your OB/GYN.    Take your prenatal vitamins as directed.    Schedule an appointment with your OB/GYN as soon as possible.

## 2024-08-11 ENCOUNTER — Ambulatory Visit: Admitting: Radiology

## 2024-08-18 ENCOUNTER — Emergency Department
Admission: EM | Admit: 2024-08-18 | Discharge: 2024-08-18 | Disposition: A | Discharge status: Home / Self Care | Source: Ambulatory Visit | Attending: Emergency Medicine | Admitting: Emergency Medicine

## 2024-08-18 ENCOUNTER — Telehealth: Payer: Self-pay

## 2024-08-18 ENCOUNTER — Encounter: Payer: Self-pay | Admitting: Intensive Care

## 2024-08-18 ENCOUNTER — Other Ambulatory Visit: Payer: Self-pay

## 2024-08-18 ENCOUNTER — Emergency Department

## 2024-08-18 DIAGNOSIS — Z3A01 Less than 8 weeks gestation of pregnancy: Secondary | ICD-10-CM | POA: Insufficient documentation

## 2024-08-18 DIAGNOSIS — R102 Pelvic and perineal pain: Secondary | ICD-10-CM | POA: Insufficient documentation

## 2024-08-18 DIAGNOSIS — O4690 Antepartum hemorrhage, unspecified, unspecified trimester: Secondary | ICD-10-CM

## 2024-08-18 DIAGNOSIS — O209 Hemorrhage in early pregnancy, unspecified: Secondary | ICD-10-CM | POA: Insufficient documentation

## 2024-08-18 LAB — URINALYSIS, ROUTINE W REFLEX MICROSCOPIC
Bilirubin Urine: NEGATIVE
Glucose, UA: NEGATIVE mg/dL
Ketones, ur: NEGATIVE mg/dL
Leukocytes,Ua: NEGATIVE
Nitrite: NEGATIVE
Protein, ur: 30 mg/dL — AB
RBC / HPF: >50 RBC/hpf (ref 0–5)
Specific Gravity, Urine: 1.019 (ref 1.005–1.030)
pH: 8 (ref 5.0–8.0)

## 2024-08-18 LAB — COMPREHENSIVE METABOLIC PANEL WITH GFR
ALT: 26 U/L (ref 0–44)
AST: 28 U/L (ref 15–41)
Albumin: 3.8 g/dL (ref 3.5–5.0)
Alkaline Phosphatase: 51 U/L (ref 38–126)
Anion gap: 9 (ref 5–15)
BUN: 7 mg/dL (ref 6–20)
CO2: 23 mmol/L (ref 22–32)
Calcium: 9.2 mg/dL (ref 8.9–10.3)
Chloride: 102 mmol/L (ref 98–111)
Creatinine, Ser: 0.65 mg/dL (ref 0.44–1.00)
GFR, Estimated: >60 mL/min (ref >60)
Glucose, Bld: 94 mg/dL (ref 70–99)
Potassium: 4 mmol/L (ref 3.5–5.1)
Sodium: 134 mmol/L — ABNORMAL LOW (ref 135–145)
Total Bilirubin: 1.1 mg/dL (ref 0.0–1.2)
Total Protein: 7.6 g/dL (ref 6.5–8.1)

## 2024-08-18 LAB — CBC
HCT: 42.5 % (ref 36.0–46.0)
Hemoglobin: 14 g/dL (ref 12.0–15.0)
MCH: 29.2 pg (ref 26.0–34.0)
MCHC: 32.9 g/dL (ref 30.0–36.0)
MCV: 88.5 fL (ref 80.0–100.0)
Platelets: 271 K/uL (ref 150–400)
RBC: 4.8 MIL/uL (ref 3.87–5.11)
RDW: 14.2 % (ref 11.5–15.5)
WBC: 10.5 K/uL (ref 4.0–10.5)
nRBC: 0 % (ref 0.0–0.2)

## 2024-08-18 LAB — POC URINE PREG, ED: Preg Test, Ur: POSITIVE — AB

## 2024-08-18 LAB — LIPASE, BLOOD: Lipase: 23 U/L (ref 11–51)

## 2024-08-18 LAB — HCG, QUANTITATIVE, PREGNANCY: hCG, Beta Chain, Quant, S: 48668 m[IU]/mL — ABNORMAL HIGH (ref <5)

## 2024-08-18 MED ORDER — ONDANSETRON 4 MG PO TBDP: 4.0000 mg | ORAL_TABLET | Freq: Three times a day (TID) | ORAL | 0 refills | Status: DC | PRN

## 2024-08-18 MED ORDER — ONDANSETRON 4 MG PO TBDP
4.0000 mg | ORAL_TABLET | Freq: Once | ORAL | Status: AC
  Administered 2024-08-18: 4 mg via ORAL
  Filled 2024-08-18: qty 1

## 2024-08-18 NOTE — ED Provider Notes (Signed)
 [facility] Provider Note    Event Date/Time   First MD Initiated Contact with Patient 08/18/24 1311     (approximate)   History   Vaginal Bleeding   HPI  Leah Hartman is a 28 y.o. female presents to the ED with complaint of vaginal bleeding that started yesterday.  Patient states that she has been through approximately 6 pantiliners since yesterday.  Patient continues to have some cramping at this time.  She denies any nausea, vomiting, fever or chills.  This is her first pregnancy.  States that she was scheduled for a ultrasound on October 20.  Patient has a history of dysmenorrhea, pelvic pain, ADHD, POTS, residual cystitis, vitamin D  deficiency, lumbar radiculopathy, depression, anxiety, prediabetes.     Physical Exam   Triage Vital Signs: ED Triage Vitals  Encounter Vitals Group     BP 08/18/24 1238 (!) 127/91     Girls Systolic BP Percentile --      Girls Diastolic BP Percentile --      Boys Systolic BP Percentile --      Boys Diastolic BP Percentile --      Pulse Rate 08/18/24 1238 97     Resp 08/18/24 1238 18     Temp 08/18/24 1238 98.2 F (36.8 C)     Temp Source 08/18/24 1238 Oral     SpO2 08/18/24 1238 99 %     Weight 08/18/24 1239 250 lb (113.4 kg)     Height 08/18/24 1239 5' 9 (1.753 m)     Head Circumference --      Peak Flow --      Pain Score 08/18/24 1239 8     Pain Loc --      Pain Education --      Exclude from Growth Chart --     Most recent vital signs: Vitals:   08/18/24 1238 08/18/24 1304  BP: (!) 127/91   Pulse: 97   Resp: 18   Temp: 98.2 F (36.8 C)   SpO2: 99% 100%     General: Awake, no distress.  Alert, talkative, cooperative. CV:  Good peripheral perfusion.  Heart regular rate and rhythm. Resp:  Normal effort.  Lungs clear bilaterally. Abd:  No distention.  Other:     ED Results / Procedures / Treatments   Labs (all labs ordered are listed, but only abnormal results are displayed) Labs  Reviewed  COMPREHENSIVE METABOLIC PANEL WITH GFR - Abnormal; Notable for the following components:      Result Value   Sodium 134 (*)    All other components within normal limits  URINALYSIS, ROUTINE W REFLEX MICROSCOPIC - Abnormal; Notable for the following components:   Color, Urine YELLOW (*)    APPearance HAZY (*)    Hgb urine dipstick MODERATE (*)    Protein, ur 30 (*)    Bacteria, UA RARE (*)    All other components within normal limits  HCG, QUANTITATIVE, PREGNANCY - Abnormal; Notable for the following components:   hCG, Beta Chain, Quant, S 51,331 (*)    All other components within normal limits  POC URINE PREG, ED - Abnormal; Notable for the following components:   Preg Test, Ur Positive (*)    All other components within normal limits  CBC  LIPASE, BLOOD      RADIOLOGY     PROCEDURES:  Critical Care performed:   Procedures   MEDICATIONS ORDERED IN ED: Medications  ondansetron  (ZOFRAN -ODT) disintegrating tablet 4 mg (  4 mg Oral Given 08/18/24 1419)     IMPRESSION / MDM / ASSESSMENT AND PLAN / ED COURSE  I reviewed the triage vital signs and the nursing notes.   Differential diagnosis includes, but is not limited to, first trimester vaginal bleeding, threatened abortion, ectopic pregnancy  28 year old female sent to the ED with complaint of bright red vaginal spotting for the last 24 hours.  Patient has been through a minimal amount of pads using panty liners.  Ultrasound was reassuring and patient was made aware that she currently is 6 weeks 5 days with a heartbeat of 130.  Beta-hCG was 48,668.  Lab work was unremarkable.  Patient has a ultrasound scheduled with her OB/GYN on October the 20th.  Patient is strongly encouraged to follow-up with her OB/GYN if any continued issues or return to the emergency department if any severe worsening of her symptoms.  A prescription for Zofran  was sent to the pharmacy for her to take as needed for nausea.      Patient's  presentation is most consistent with acute presentation with potential threat to life or bodily function.  FINAL CLINICAL IMPRESSION(S) / ED DIAGNOSES   Final diagnoses:  Vaginal bleeding in pregnancy     Rx / DC Orders   ED Discharge Orders          Ordered    ondansetron  (ZOFRAN -ODT) 4 MG disintegrating tablet  Every 8 hours PRN        08/18/24 1458             Note:  This document was prepared using Dragon voice recognition software and may include unintentional dictation errors.   Saunders Shona CROME, PA-C 08/18/24 1506    Arlander Charleston, MD 08/18/24 [phone]

## 2024-08-18 NOTE — Telephone Encounter (Signed)
 Patient contacted office stating that she is pregnant and has her first appointment with our office on 08/23/24 for NOB intake. Patient reports for the past 2-3 days she has been experiencing  light-moderate bleeding, abdominal cramping and vomiting. Patient stated one phone she has been vomiting so much that she is unable to hold down water. Advised patient to go to ED for immediate evaluation. Patient verbalized understanding. KW

## 2024-08-18 NOTE — Discharge Instructions (Signed)
 Follow-up with your OB/GYN and keep your appointment that is already scheduled.  A prescription for Zofran  was sent to the pharmacy to take as needed for nausea.  Return to the emergency department if any severe worsening of your symptoms or urgent concerns.  You may also sparingly take Tylenol  if needed for cramping.

## 2024-08-18 NOTE — ED Triage Notes (Signed)
 Patient presents with vaginal bleeding and pelvic cramping that started yesterday. Reports bright red spotting.   First pregnancy. First US  scheduled for October 20th

## 2024-08-23 ENCOUNTER — Telehealth: Payer: Self-pay

## 2024-08-23 ENCOUNTER — Telehealth: Payer: Self-pay | Admitting: Advanced Practice Midwife

## 2024-08-23 ENCOUNTER — Telehealth (INDEPENDENT_AMBULATORY_CARE_PROVIDER_SITE_OTHER)

## 2024-08-23 ENCOUNTER — Other Ambulatory Visit: Payer: Self-pay

## 2024-08-23 DIAGNOSIS — Z3689 Encounter for other specified antenatal screening: Secondary | ICD-10-CM

## 2024-08-23 DIAGNOSIS — Z3480 Encounter for supervision of other normal pregnancy, unspecified trimester: Secondary | ICD-10-CM | POA: Insufficient documentation

## 2024-08-23 MED ORDER — ONDANSETRON 4 MG PO TBDP: 4.0000 mg | ORAL_TABLET | Freq: Three times a day (TID) | ORAL | 0 refills | Status: DC | PRN
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
## 2024-08-23 NOTE — Telephone Encounter (Signed)
 Pt was seen in ER for vaginal bleeding on 08/18/24, had an US :  Maternal uterus/adnexae: Ovaries normal size shape and position with normal color Doppler. 2 cm corpus luteum right ovary. Left ovary not visualized. Trace free fluid. Few tiny nabothian cysts.   IMPRESSION: Single live IUP with estimated gestational age [redacted] weeks 5 days.  Denies vaginal bleeding as of today, on/off mild cramping. Does she still need scheduled dating scan on 09/13/24? Or can we cancel?

## 2024-08-23 NOTE — Patient Instructions (Signed)
 First Trimester of Pregnancy  The first trimester of pregnancy starts on the first day of your last monthly period until the end of week 13. This is months 1 through 3 of pregnancy. A week after a sperm fertilizes an egg, the egg will implant into the wall of the uterus and begin to develop into a baby. Body changes during your first trimester Your body goes through many changes during pregnancy. The changes usually return to normal after your baby is born. Physical changes Your breasts may grow larger and may hurt. The area around your nipples may get darker. Your periods will stop. Your hair and nails may grow faster. You may pee more often. Health changes You may tire easily. Your gums may bleed and may be sensitive when you brush and floss. You may not feel hungry. You may have heartburn. You may throw up or feel like you may throw up. You may want to eat some foods, but not others. You may have headaches. You may have trouble pooping (constipation). Other changes Your emotions may change from day to day. You may have more dreams. Follow these instructions at home: Medicines Talk to your health care provider if you're taking medicines. Ask if the medicines are safe to take during pregnancy. Your provider may change the medicines that you take. Do not take any medicines unless told to by your provider. Take a prenatal vitamin that has at least 600 micrograms (mcg) of folic acid. Do not use herbal medicines, illegal substances, or medicines that are not approved by your provider. Eating and drinking While you're pregnant your body needs extra food for your growing baby. Talk with your provider about what to eat while pregnant. Activity Most women are able to exercise during pregnancy. Exercises may need to change as your pregnancy goes on. Talk to your provider about your activities and exercise routines. Relieving pain and discomfort Wear a good, supportive bra if your breasts  hurt. Rest with your legs raised if you have leg cramps or low back pain. Safety Wear your seatbelt at all times when you're in a car. Talk to your provider if someone hits you, hurts you, or yells at you. Talk with your provider if you're feeling sad or have thoughts of hurting yourself. Lifestyle Certain things can be harmful while you're pregnant. Follow these rules: Do not use hot tubs, steam rooms, or saunas. Do not douche. Do not use tampons or scented pads. Do not drink alcohol,smoke, vape, or use products with nicotine or tobacco in them. If you need help quitting, talk with your provider. Avoid cat litter boxes and soil used by cats. These things carry germs that can cause harm to your pregnancy and your baby. General instructions Keep all follow-up visits. It helps you and your unborn baby stay as healthy as possible. Write down your questions. Take them to your visits. Your provider will: Talk with you about your overall health. Give you advice or refer you to specialists who can help with different needs, including: Prenatal education classes. Mental health and counseling. Foods and healthy eating. Ask for help if you need help with food. Call your dentist and ask to be seen. Brush your teeth with a soft toothbrush. Floss gently. Where to find more information American Pregnancy Association: americanpregnancy.org [employer]: acog.org Office on [employer] Health: [employer] Contact a health care provider if: You feel dizzy, faint, or have a fever. You vomit or have watery poop (diarrhea) for 2  days or more. You have abnormal discharge or bleeding from your vagina. You have pain when you pee or your pee smells bad. You have cramps, pain, or pressure in your belly area. Get help right away if: You have trouble breathing or chest pain. You have any kind of injury, such as from a fall or a car crash. These symptoms may be an  emergency. Get help right away. Call 911. Do not wait to see if the symptoms will go away. Do not drive yourself to the hospital. This information is not intended to replace advice given to you by your health care provider. Make sure you discuss any questions you have with your health care provider. Document Revised: 08/14/2023 Document Reviewed: 03/14/2023 Elsevier Patient Education  2024 Elsevier Inc.   Common Medications Safe in Pregnancy  Acne:      Constipation:  Benzoyl Peroxide     Colace  Clindamycin      Dulcolax Suppository  Topica Erythromycin     Fibercon  Salicylic Acid      Metamucil         Miralax AVOID:        Senakot   Accutane    Cough:  Retin-A       Cough Drops  Tetracycline      Phenergan w/ Codeine if Rx  Minocycline      Robitussin (Plain & DM)  Antibiotics:     Crabs/Lice:  Ceclor       RID  Cephalosporins    AVOID:  E-Mycins      Kwell  Keflex  Macrobid/Macrodantin   Diarrhea:  Penicillin      Kao-Pectate  Zithromax      Imodium AD         PUSH FLUIDS AVOID:       Cipro     Fever:  Tetracycline      Tylenol (Regular or Extra  Minocycline       Strength)  Levaquin      Extra Strength-Do not          Exceed 8 tabs/24 hrs Caffeine:        200mg /day (equiv. To 1 cup of coffee or  approx. 3 12 oz sodas)         Gas: Cold/Hayfever:       Gas-X  Benadryl      Mylicon  Claritin       Phazyme  **Claritin-D        Chlor-Trimeton    Headaches:  Dimetapp      ASA-Free Excedrin  Drixoral-Non-Drowsy     Cold Compress  Mucinex (Guaifenasin)     Tylenol (Regular or Extra  Sudafed/Sudafed-12 Hour     Strength)  **Sudafed PE Pseudoephedrine   Tylenol Cold & Sinus     Vicks Vapor Rub  Zyrtec  **AVOID if Problems With Blood Pressure         Heartburn: Avoid lying down for at least 1 hour after meals  Aciphex      Maalox     Rash:  Milk of Magnesia     Benadryl    Mylanta       1% Hydrocortisone Cream  Pepcid  Pepcid Complete   Sleep  Aids:  Prevacid      Ambien   Prilosec       Benadryl  Rolaids       Chamomile Tea  Tums (Limit 4/day)     Unisom  Tylenol PM         Warm milk-add vanilla or  Hemorrhoids:       Sugar for taste  Anusol/Anusol H.C.  (RX: Analapram 2.5%)  Sugar Substitutes:  Hydrocortisone OTC     Ok in moderation  Preparation H      Tucks        Vaseline lotion applied to tissue with wiping    Herpes:     Throat:  Acyclovir      Oragel  Famvir  Valtrex     Vaccines:         Flu Shot Leg Cramps:       *Gardasil  Benadryl      Hepatitis A         Hepatitis B Nasal Spray:       Pneumovax  Saline Nasal Spray     Polio Booster         Tetanus Nausea:       Tuberculosis test or PPD  Vitamin B6 25 mg TID   AVOID:    Dramamine      *Gardasil  Emetrol       Live Poliovirus  Ginger Root 250 mg QID    MMR (measles, mumps &  High Complex Carbs @ Bedtime    rebella)  Sea Bands-Accupressure    Varicella (Chickenpox)  Unisom 1/2 tab TID     *No known complications           If received before Pain:         Known pregnancy;   Darvocet       Resume series after  Lortab        Delivery  Percocet    Yeast:   Tramadol      Femstat  Tylenol 3      Gyne-lotrimin  Ultram       Monistat  Vicodin           MISC:         All Sunscreens           Hair Coloring/highlights          Insect Repellant's          (Including DEET)         Mystic Tans   Commonly Asked Questions During Pregnancy   Cats: A parasite can be excreted in cat feces.  To avoid exposure you need to have another person empty the little box.  If you must empty the litter box you will need to wear gloves.  Wash your hands after handling your cat.  This parasite can also be found in raw or undercooked meat so this should also be avoided.  Colds, Sore Throats, Flu: Please check your medication sheet to see what you can take for symptoms.  If your symptoms are unrelieved by these medications please call the office.  Dental Work: Most  any dental work Agricultural consultant recommends is permitted.  X-rays should only be taken during the first trimester if absolutely necessary.  Your abdomen should be shielded with a lead apron during all x-rays.  Please notify your provider prior to receiving any x-rays.  Novocaine is fine; gas is not recommended.  If your dentist requires a note from [place] prior to dental work please call the office and we will provide one for you.  Exercise: Exercise is an important part of staying healthy during your pregnancy.  You may continue most exercises you were accustomed to prior to pregnancy.  Later in your pregnancy you will most likely notice you have difficulty with activities requiring balance like riding a bicycle.  It is important that you listen to your body and avoid activities that put you at a higher risk of falling.  Adequate rest and staying well hydrated are a must!  If you have questions about the safety of specific activities ask your provider.    Exposure to Children with illness: Try to avoid obvious exposure; report any symptoms to [place] when noted,  If you have chicken pos, red measles or mumps, you should be immune to these diseases.   Please do not take any vaccines while pregnant unless you have checked with your OB provider.  Fetal Movement: After 28 weeks we recommend you do "kick counts" twice daily.  Lie or sit down in a calm quiet environment and count your baby movements "kicks".  You should feel your baby at least 10 times per hour.  If you have not felt 10 kicks within the first hour get up, walk around and have something sweet to eat or drink then repeat for an additional hour.  If count remains less than 10 per hour notify your provider.  Fumigating: Follow your pest control agent's advice as to how long to stay out of your home.  Ventilate the area well before re-entering.  Hemorrhoids:   Most over-the-counter preparations can be used during pregnancy.  Check your medication to see what is  safe to use.  It is important to use a stool softener or fiber in your diet and to drink lots of liquids.  If hemorrhoids seem to be getting worse please call the office.   Hot Tubs:  Hot tubs Jacuzzis and saunas are not recommended while pregnant.  These increase your internal body temperature and should be avoided.  Intercourse:  Sexual intercourse is safe during pregnancy as long as you are comfortable, unless otherwise advised by your provider.  Spotting may occur after intercourse; report any bright red bleeding that is heavier than spotting.  Labor:  If you know that you are in labor, please go to the hospital.  If you are unsure, please call the office and let us help you decide what to do.  Lifting, straining, etc:  If your job requires heavy lifting or straining please check with your provider for any limitations.  Generally, you should not lift items heavier than that you can lift simply with your hands and arms (no back muscles)  Painting:  Paint fumes do not harm your pregnancy, but may make you ill and should be avoided if possible.  Latex or water based paints have less odor than oils.  Use adequate ventilation while painting.  Permanents & Hair Color:  Chemicals in hair dyes are not recommended as they cause increase hair dryness which can increase hair loss during pregnancy.  " Highlighting" and permanents are allowed.  Dye may be absorbed differently and permanents may not hold as well during pregnancy.  Sunbathing:  Use a sunscreen, as skin burns easily during pregnancy.  Drink plenty of fluids; avoid over heating.  Tanning Beds:  Because their possible side effects are still unknown, tanning beds are not recommended.  Ultrasound Scans:  Routine ultrasounds are performed at approximately 20 weeks.  You will be able to see your baby's general anatomy an if you would like to know the gender this can usually be determined as well.  If it is questionable when you conceived you may  also  receive an ultrasound early in your pregnancy for dating purposes.  Otherwise ultrasound exams are not routinely performed unless there is a medical necessity.  Although you can request a scan we ask that you pay for it when conducted because insurance does not cover " patient request" scans.  Work: If your pregnancy proceeds without complications you may work until your due date, unless your physician or employer advises otherwise.  Round Ligament Pain/Pelvic Discomfort:  Sharp, shooting pains not associated with bleeding are fairly common, usually occurring in the second trimester of pregnancy.  They tend to be worse when standing up or when you remain standing for long periods of time.  These are the result of pressure of certain pelvic ligaments called "round ligaments".  Rest, Tylenol and heat seem to be the most effective relief.  As the womb and fetus grow, they rise out of the pelvis and the discomfort improves.  Please notify the office if your pain seems different than that described.  It may represent a more serious condition.

## 2024-08-23 NOTE — Telephone Encounter (Signed)
 Zofran  prescribed at ER. Pt requesting RF as this is the only thing helping her with nausea/vomiting.

## 2024-08-23 NOTE — Telephone Encounter (Signed)
 Contacting the patient to scheduled. NOB appointment. Left message for the patient to contact the office.

## 2024-08-30 MED ORDER — ONDANSETRON 4 MG PO TBDP: 4.0000 mg | ORAL_TABLET | Freq: Three times a day (TID) | ORAL | 1 refills | Status: DC | PRN

## 2024-08-30 NOTE — Addendum Note (Signed)
 Addended by: Nelani Schmelzle on: 08/30/2024 04:43 PM   Modules accepted: Orders

## 2024-09-13 ENCOUNTER — Other Ambulatory Visit

## 2024-09-23 ENCOUNTER — Other Ambulatory Visit: Payer: Self-pay

## 2024-09-23 ENCOUNTER — Emergency Department
Admission: EM | Admit: 2024-09-23 | Discharge: 2024-09-23 | Disposition: A | Discharge status: Home / Self Care | Source: Ambulatory Visit | Attending: Emergency Medicine | Admitting: Emergency Medicine

## 2024-09-23 ENCOUNTER — Encounter: Payer: Self-pay | Admitting: Emergency Medicine

## 2024-09-23 ENCOUNTER — Telehealth: Payer: Self-pay

## 2024-09-23 DIAGNOSIS — R55 Syncope and collapse: Secondary | ICD-10-CM | POA: Diagnosis not present

## 2024-09-23 DIAGNOSIS — E860 Dehydration: Secondary | ICD-10-CM | POA: Diagnosis not present

## 2024-09-23 DIAGNOSIS — O219 Vomiting of pregnancy, unspecified: Secondary | ICD-10-CM | POA: Insufficient documentation

## 2024-09-23 DIAGNOSIS — Z3A12 12 weeks gestation of pregnancy: Secondary | ICD-10-CM | POA: Insufficient documentation

## 2024-09-23 LAB — URINALYSIS, ROUTINE W REFLEX MICROSCOPIC
Bilirubin Urine: NEGATIVE
Glucose, UA: NEGATIVE mg/dL
Ketones, ur: 5 mg/dL — AB
Nitrite: NEGATIVE
Protein, ur: 100 mg/dL — AB
Specific Gravity, Urine: 1.029 (ref 1.005–1.030)
pH: 5 (ref 5.0–8.0)

## 2024-09-23 LAB — COMPREHENSIVE METABOLIC PANEL WITH GFR
ALT: 70 U/L — ABNORMAL HIGH (ref 0–44)
AST: 44 U/L — ABNORMAL HIGH (ref 15–41)
Albumin: 3.6 g/dL (ref 3.5–5.0)
Alkaline Phosphatase: 52 U/L (ref 38–126)
Anion gap: 11 (ref 5–15)
BUN: 6 mg/dL (ref 6–20)
CO2: 23 mmol/L (ref 22–32)
Calcium: 9.3 mg/dL (ref 8.9–10.3)
Chloride: 104 mmol/L (ref 98–111)
Creatinine, Ser: 0.54 mg/dL (ref 0.44–1.00)
GFR, Estimated: >60 mL/min (ref >60)
Glucose, Bld: 109 mg/dL — ABNORMAL HIGH (ref 70–99)
Potassium: 3.6 mmol/L (ref 3.5–5.1)
Sodium: 138 mmol/L (ref 135–145)
Total Bilirubin: 1 mg/dL (ref 0.0–1.2)
Total Protein: 7.8 g/dL (ref 6.5–8.1)

## 2024-09-23 LAB — CBC
HCT: 40.7 % (ref 36.0–46.0)
Hemoglobin: 13.8 g/dL (ref 12.0–15.0)
MCH: 29.7 pg (ref 26.0–34.0)
MCHC: 33.9 g/dL (ref 30.0–36.0)
MCV: 87.7 fL (ref 80.0–100.0)
Platelets: 227 K/uL (ref 150–400)
RBC: 4.64 MIL/uL (ref 3.87–5.11)
RDW: 13.7 % (ref 11.5–15.5)
WBC: 9.8 K/uL (ref 4.0–10.5)
nRBC: 0 % (ref 0.0–0.2)

## 2024-09-23 LAB — CBG MONITORING, ED: Glucose-Capillary: 92 mg/dL (ref 70–99)

## 2024-09-23 LAB — HCG, QUANTITATIVE, PREGNANCY: hCG, Beta Chain, Quant, S: 63097 m[IU]/mL — ABNORMAL HIGH (ref <5)

## 2024-09-23 MED ORDER — METOCLOPRAMIDE HCL 5 MG/ML IJ SOLN
10.0000 mg | Freq: Once | INTRAMUSCULAR | Status: AC
  Administered 2024-09-23: 10 mg via INTRAVENOUS
  Filled 2024-09-23: qty 2

## 2024-09-23 MED ORDER — LACTATED RINGERS IV BOLUS
1000.0000 mL | Freq: Once | INTRAVENOUS | Status: AC
  Administered 2024-09-23: 1000 mL via INTRAVENOUS

## 2024-09-23 MED ORDER — PROMETHAZINE HCL 12.5 MG RE SUPP: 12.5000 mg | Freq: Four times a day (QID) | RECTAL | 0 refills | Status: DC | PRN

## 2024-09-23 MED ORDER — PROMETHAZINE HCL 12.5 MG PO TABS: 12.5000 mg | ORAL_TABLET | Freq: Four times a day (QID) | ORAL | 0 refills | Status: DC | PRN

## 2024-09-23 MED ORDER — DIPHENHYDRAMINE HCL 50 MG/ML IJ SOLN
12.5000 mg | Freq: Once | INTRAMUSCULAR | Status: AC
  Administered 2024-09-23: 12.5 mg via INTRAVENOUS
  Filled 2024-09-23: qty 1

## 2024-09-23 NOTE — Discharge Instructions (Signed)
 As we discussed please drink plenty of fluids.  Please follow-up with your OB/GYN within the next couple days for recheck/reevaluation.  Return to the emergency department for further nausea vomiting if you are unable to keep down fluids or develop any abdominal pain at any point.  Please take your nausea medication as needed but only as prescribed.  This nausea medication may make you tired.

## 2024-09-23 NOTE — Telephone Encounter (Signed)
 Received call on triage line from patient who is [redacted]w[redacted]d and reports having severe nausea and vomiting.  She says she has been unable to hold food down for 4 days and has not held water down for 3 days.  She says she has passed out 3 times while vomiting and has continued dizziness.  She is taking ondansetron  but it is not helping at all.  Advised patient should go to the ED for evaluation and fluids.  She will call her husband to take her as she does not feel she can drive there safely.  She has an appointment next week with [facility] for her initial OB.

## 2024-09-23 NOTE — ED Provider Notes (Signed)
 [facility] Provider Note    Event Date/Time   First MD Initiated Contact with Patient 09/23/24 [phone]     (approximate)  History   Chief Complaint: Loss of Consciousness  HPI  Leah Hartman is a 28 y.o. female with a past medical history of gastric reflux, POTS syndrome, approximately [redacted] weeks pregnant who presents to the emergency department for nausea vomiting and syncopal episodes.  According to the patient she has a history of POTS syndrome and has passed out multiple times in the past, she is also currently [redacted] weeks pregnant and has been experiencing significant nausea and vomiting.  States she has been unable to keep down any decent amount of fluids over the last 3 to 4 days due to nausea and vomiting which she believes have increased her lightheadedness and episodes of passing out.  Patient called her OB and they have been using Zofran  at home but it has not been working so they recommended that she come to the emergency department for IV fluids per patient.  Physical Exam   Triage Vital Signs: ED Triage Vitals  Encounter Vitals Group     BP 09/23/24 0934 (!) 115/93     Girls Systolic BP Percentile --      Girls Diastolic BP Percentile --      Boys Systolic BP Percentile --      Boys Diastolic BP Percentile --      Pulse Rate 09/23/24 0934 (!) 108     Resp 09/23/24 0934 17     Temp 09/23/24 0934 98 F (36.7 C)     Temp Source 09/23/24 0934 Oral     SpO2 09/23/24 0934 99 %     Weight 09/23/24 0935 260 lb (117.9 kg)     Height 09/23/24 0935 5' 9 (1.753 m)     Head Circumference --      Peak Flow --      Pain Score 09/23/24 0934 6     Pain Loc --      Pain Education --      Exclude from Growth Chart --     Most recent vital signs: Vitals:   09/23/24 0934  BP: (!) 115/93  Pulse: (!) 108  Resp: 17  Temp: 98 F (36.7 C)  SpO2: 99%    General: Awake, no distress.  CV:  Good peripheral perfusion.  Regular rate and rhythm  Resp:  Normal  effort.  Equal breath sounds bilaterally.  Abd:  No distention.    ED Results / Procedures / Treatments   EKG  EKG viewed and interpreted by myself shows a normal sinus rhythm at 99 bpm with a narrow QRS, normal axis, normal intervals, no concerning ST changes.  MEDICATIONS ORDERED IN ED: Medications - No data to display   IMPRESSION / MDM / ASSESSMENT AND PLAN / ED COURSE  I reviewed the triage vital signs and the nursing notes.  Patient's presentation is most consistent with acute presentation with potential threat to life or bodily function.  Patient presents emergency department for nausea vomiting and syncope.  Patient is approximately [redacted] weeks pregnant has a history of POTS syndrome, has been experiencing significant nausea and vomiting throughout this pregnancy.  We will check labs we will continue to closely monitor the patient.  She is slightly tachycardic around 90 to 100 bpm during my evaluation.  Appears nauseated holding an emesis bag.  Will treat nausea we will IV hydrate with 2 L of fluid.  Will reassess after fluids and results are known.  Lab work is reassuring.  CBC is normal, chemistry shows there is slight LFT elevation but no abdominal pain no right upper quadrant tenderness.  A urine culture has been sent as a precaution but no signs of any obvious urinary tract infection.  Beta-hCG normal at 63,000.  Patient states he is feeling much better was able to take a nap no longer feels nauseated.  We will discharge with a prescription for Phenergan  both the oral form as well as suppository form.  I discussed with the patient how to take this and only taking 1 or the other.  Patient agreeable to plan.  She will follow-up with her OB/GYN.  She still has over 1 L of fluid remaining we will place this on a pump and discharge after fluid administration.  Patient agreeable to plan.  FINAL CLINICAL IMPRESSION(S) / ED DIAGNOSES   Nausea vomiting of  pregnancy Syncope Dehydration  Note:  This document was prepared using Dragon voice recognition software and may include unintentional dictation errors.   Dorothyann Drivers, MD 09/23/24 1322

## 2024-09-23 NOTE — ED Triage Notes (Signed)
 Patient to ED via POV for syncope. States she is currently [redacted] weeks pregnant. States had morning sickness entire pregnancy but over the last 4 days unable to keep anything down and had syncopal episodes. Denies hitting head. C/o lower back pain. Hx POTS
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
## 2024-09-27 ENCOUNTER — Other Ambulatory Visit (HOSPITAL_COMMUNITY)
Admission: RE | Admit: 2024-09-27 | Discharge: 2024-09-27 | Disposition: A | Discharge status: Home / Self Care | Source: Ambulatory Visit | Attending: Licensed Practical Nurse | Admitting: Licensed Practical Nurse

## 2024-09-27 ENCOUNTER — Ambulatory Visit: Admitting: Licensed Practical Nurse

## 2024-09-27 VITALS — BP 118/81 | HR 116 | Wt 253.2 lb

## 2024-09-27 DIAGNOSIS — Z113 Encounter for screening for infections with a predominantly sexual mode of transmission: Secondary | ICD-10-CM | POA: Insufficient documentation

## 2024-09-27 DIAGNOSIS — F17200 Nicotine dependence, unspecified, uncomplicated: Secondary | ICD-10-CM

## 2024-09-27 DIAGNOSIS — Z3481 Encounter for supervision of other normal pregnancy, first trimester: Secondary | ICD-10-CM

## 2024-09-27 DIAGNOSIS — O219 Vomiting of pregnancy, unspecified: Secondary | ICD-10-CM

## 2024-09-27 DIAGNOSIS — Z3A12 12 weeks gestation of pregnancy: Secondary | ICD-10-CM | POA: Diagnosis not present

## 2024-09-27 DIAGNOSIS — Z3480 Encounter for supervision of other normal pregnancy, unspecified trimester: Secondary | ICD-10-CM | POA: Insufficient documentation

## 2024-09-27 DIAGNOSIS — Z0283 Encounter for blood-alcohol and blood-drug test: Secondary | ICD-10-CM

## 2024-09-27 DIAGNOSIS — Z1379 Encounter for other screening for genetic and chromosomal anomalies: Secondary | ICD-10-CM

## 2024-09-27 DIAGNOSIS — T7589XA Other specified effects of external causes, initial encounter: Secondary | ICD-10-CM

## 2024-09-27 DIAGNOSIS — Z369 Encounter for antenatal screening, unspecified: Secondary | ICD-10-CM

## 2024-09-27 MED ORDER — DOXYLAMINE-PYRIDOXINE 10-10 MG PO TBEC: 2.0000 | DELAYED_RELEASE_TABLET | Freq: Every day | ORAL | 5 refills | Status: AC

## 2024-09-27 MED ORDER — METOCLOPRAMIDE HCL 10 MG PO TABS: 10.0000 mg | ORAL_TABLET | Freq: Four times a day (QID) | ORAL | 2 refills | Status: DC | PRN

## 2024-09-28 LAB — PROTEIN / CREATININE RATIO, URINE
Creatinine, Urine: 203.4 mg/dL
Protein, Ur: 28.7 mg/dL
Protein/Creat Ratio: 141 mg/g{creat} (ref 0–200)

## 2024-09-29 LAB — CBC/D/PLT+RPR+RH+ABO+RUBIGG...
Antibody Screen: NEGATIVE
Basophils Absolute: 0 x10E3/uL (ref 0.0–0.2)
Basos: 0 %
EOS (ABSOLUTE): 0 x10E3/uL (ref 0.0–0.4)
Eos: 0 %
HCV Ab: NONREACTIVE
HIV Screen 4th Generation wRfx: NONREACTIVE
Hematocrit: 42 % (ref 34.0–46.6)
Hemoglobin: 13.9 g/dL (ref 11.1–15.9)
Hepatitis B Surface Ag: NEGATIVE
Immature Grans (Abs): 0.1 x10E3/uL (ref 0.0–0.1)
Immature Granulocytes: 1 %
Lymphocytes Absolute: 2 x10E3/uL (ref 0.7–3.1)
Lymphs: 21 %
MCH: 30 pg (ref 26.6–33.0)
MCHC: 33.1 g/dL (ref 31.5–35.7)
MCV: 91 fL (ref 79–97)
Monocytes Absolute: 0.5 x10E3/uL (ref 0.1–0.9)
Monocytes: 6 %
Neutrophils Absolute: 6.7 x10E3/uL (ref 1.4–7.0)
Neutrophils: 72 %
Platelets: 267 x10E3/uL (ref 150–450)
RBC: 4.64 x10E6/uL (ref 3.77–5.28)
RDW: 13.3 % (ref 11.7–15.4)
RPR Ser Ql: NONREACTIVE
Rh Factor: POSITIVE
Rubella Antibodies, IGG: 3.5 {index} (ref >0.99)
Varicella zoster IgG: REACTIVE
WBC: 9.3 x10E3/uL (ref 3.4–10.8)

## 2024-09-29 LAB — TOXOPLASMA ANTIBODIES- IGG AND  IGM
Toxoplasma Antibody- IgM: <3 [AU]/ml (ref 0.0–7.9)
Toxoplasma IgG Ratio: <3 [IU]/mL (ref 0.0–7.1)

## 2024-09-29 LAB — COMPREHENSIVE METABOLIC PANEL WITH GFR
ALT: 79 IU/L — ABNORMAL HIGH (ref 0–32)
AST: 47 IU/L — ABNORMAL HIGH (ref 0–40)
Albumin: 4.2 g/dL (ref 4.0–5.0)
Alkaline Phosphatase: 68 IU/L (ref 41–116)
BUN/Creatinine Ratio: 8 — ABNORMAL LOW (ref 9–23)
BUN: 5 mg/dL — ABNORMAL LOW (ref 6–20)
Bilirubin Total: 0.9 mg/dL (ref 0.0–1.2)
CO2: 20 mmol/L (ref 20–29)
Calcium: 9.6 mg/dL (ref 8.7–10.2)
Chloride: 101 mmol/L (ref 96–106)
Creatinine, Ser: 0.65 mg/dL (ref 0.57–1.00)
Globulin, Total: 2.5 g/dL (ref 1.5–4.5)
Glucose: 85 mg/dL (ref 70–99)
Potassium: 4.1 mmol/L (ref 3.5–5.2)
Sodium: 135 mmol/L (ref 134–144)
Total Protein: 6.7 g/dL (ref 6.0–8.5)
eGFR: 123 mL/min/1.73 (ref >59)

## 2024-09-29 LAB — HGB FRACTIONATION CASCADE
Hgb A2: 2.4 % (ref 1.8–3.2)
Hgb A: 97.6 % (ref 96.4–98.8)
Hgb F: 0 % (ref 0.0–2.0)
Hgb S: 0 %

## 2024-09-29 LAB — TSH+FREE T4
Free T4: 1.03 ng/dL (ref 0.82–1.77)
TSH: 1.71 u[IU]/mL (ref 0.450–4.500)

## 2024-09-29 LAB — CERVICOVAGINAL ANCILLARY ONLY
Bacterial Vaginitis (gardnerella): NEGATIVE
Candida Glabrata: NEGATIVE
Candida Vaginitis: NEGATIVE
Chlamydia: NEGATIVE
Comment: NEGATIVE
Comment: NEGATIVE
Comment: NEGATIVE
Comment: NEGATIVE
Comment: NEGATIVE
Comment: NORMAL
Neisseria Gonorrhea: NEGATIVE
Trichomonas: NEGATIVE

## 2024-09-29 LAB — HEMOGLOBIN A1C
Est. average glucose Bld gHb Est-mCnc: 111 mg/dL
Hgb A1c MFr Bld: 5.5 % (ref 4.8–5.6)

## 2024-09-29 LAB — CULTURE, OB URINE

## 2024-09-29 LAB — URINE CULTURE, OB REFLEX: Organism ID, Bacteria: NO GROWTH

## 2024-09-29 LAB — HCV INTERPRETATION

## 2024-10-02 DIAGNOSIS — F17200 Nicotine dependence, unspecified, uncomplicated: Secondary | ICD-10-CM | POA: Insufficient documentation

## 2024-10-03 ENCOUNTER — Encounter: Payer: Self-pay | Admitting: Licensed Practical Nurse

## 2024-10-03 LAB — MATERNIT 21 PLUS CORE, BLOOD
Fetal Fraction: 7
Result (T21): NEGATIVE
Trisomy 13 (Patau syndrome): NEGATIVE
Trisomy 18 (Edwards syndrome): NEGATIVE
Trisomy 21 (Down syndrome): NEGATIVE

## 2024-10-03 MED ORDER — ASPIRIN 81 MG PO CHEW
81.0000 mg | CHEWABLE_TABLET | Freq: Every day | ORAL | Status: AC
Start: 2024-10-03 — End: ?

## 2024-10-04 ENCOUNTER — Observation Stay
Admission: EM | Admit: 2024-10-04 | Discharge: 2024-10-04 | Disposition: A | Discharge status: Home / Self Care | Attending: Certified Nurse Midwife | Admitting: Certified Nurse Midwife

## 2024-10-04 ENCOUNTER — Other Ambulatory Visit: Payer: Self-pay | Admitting: Certified Nurse Midwife

## 2024-10-04 ENCOUNTER — Encounter: Payer: Self-pay | Admitting: Licensed Practical Nurse

## 2024-10-04 ENCOUNTER — Other Ambulatory Visit: Payer: Self-pay

## 2024-10-04 DIAGNOSIS — O219 Vomiting of pregnancy, unspecified: Principal | ICD-10-CM | POA: Insufficient documentation

## 2024-10-04 DIAGNOSIS — Z3A13 13 weeks gestation of pregnancy: Secondary | ICD-10-CM | POA: Insufficient documentation

## 2024-10-04 DIAGNOSIS — Z7982 Long term (current) use of aspirin: Secondary | ICD-10-CM | POA: Diagnosis not present

## 2024-10-04 DIAGNOSIS — R1110 Vomiting, unspecified: Secondary | ICD-10-CM

## 2024-10-04 DIAGNOSIS — Z87891 Personal history of nicotine dependence: Secondary | ICD-10-CM | POA: Diagnosis not present

## 2024-10-04 LAB — COMPREHENSIVE METABOLIC PANEL WITH GFR
ALT: 61 U/L — ABNORMAL HIGH (ref 0–44)
AST: 41 U/L (ref 15–41)
Albumin: 3.5 g/dL (ref 3.5–5.0)
Alkaline Phosphatase: 53 U/L (ref 38–126)
Anion gap: 12 (ref 5–15)
BUN: 6 mg/dL (ref 6–20)
CO2: 20 mmol/L — ABNORMAL LOW (ref 22–32)
Calcium: 9.1 mg/dL (ref 8.9–10.3)
Chloride: 104 mmol/L (ref 98–111)
Creatinine, Ser: 0.53 mg/dL (ref 0.44–1.00)
GFR, Estimated: >60 mL/min (ref >60)
Glucose, Bld: 95 mg/dL (ref 70–99)
Potassium: 3.9 mmol/L (ref 3.5–5.1)
Sodium: 136 mmol/L (ref 135–145)
Total Bilirubin: 0.9 mg/dL (ref 0.0–1.2)
Total Protein: 7.6 g/dL (ref 6.5–8.1)

## 2024-10-04 LAB — CBC
HCT: 39.3 % (ref 36.0–46.0)
Hemoglobin: 13.4 g/dL (ref 12.0–15.0)
MCH: 29.3 pg (ref 26.0–34.0)
MCHC: 34.1 g/dL (ref 30.0–36.0)
MCV: 86 fL (ref 80.0–100.0)
Platelets: 231 K/uL (ref 150–400)
RBC: 4.57 MIL/uL (ref 3.87–5.11)
RDW: 13.7 % (ref 11.5–15.5)
WBC: 10.7 K/uL — ABNORMAL HIGH (ref 4.0–10.5)
nRBC: 0 % (ref 0.0–0.2)

## 2024-10-04 LAB — URINALYSIS, ROUTINE W REFLEX MICROSCOPIC
Bilirubin Urine: NEGATIVE
Glucose, UA: NEGATIVE mg/dL
Ketones, ur: NEGATIVE mg/dL
Leukocytes,Ua: NEGATIVE
Nitrite: NEGATIVE
Protein, ur: NEGATIVE mg/dL
Specific Gravity, Urine: 1.018 (ref 1.005–1.030)
pH: 6 (ref 5.0–8.0)

## 2024-10-04 LAB — TSH: TSH: 2.345 u[IU]/mL (ref 0.350–4.500)

## 2024-10-04 LAB — TYPE AND SCREEN
ABO/RH(D): A POS
Antibody Screen: NEGATIVE

## 2024-10-04 MED ORDER — METOCLOPRAMIDE HCL 5 MG/ML IJ SOLN
10.0000 mg | Freq: Four times a day (QID) | INTRAMUSCULAR | Status: DC
  Administered 2024-10-04 (×2): 10 mg via INTRAVENOUS
  Filled 2024-10-04 (×2): qty 2

## 2024-10-04 MED ORDER — PRENATAL MULTIVITAMIN CH: 1.0000 | ORAL_TABLET | Freq: Every day | ORAL | Status: DC

## 2024-10-04 MED ORDER — PROMETHAZINE HCL 25 MG RE SUPP: 12.5000 mg | RECTAL | Status: DC | PRN

## 2024-10-04 MED ORDER — SCOPOLAMINE 1 MG/3DAYS TD PT72
1.0000 | MEDICATED_PATCH | TRANSDERMAL | Status: DC
  Administered 2024-10-04: 1 mg via TRANSDERMAL
  Filled 2024-10-04: qty 1

## 2024-10-04 MED ORDER — ZOLPIDEM TARTRATE 5 MG PO TABS: 5.0000 mg | ORAL_TABLET | Freq: Every evening | ORAL | Status: DC | PRN

## 2024-10-04 MED ORDER — HYDROXYZINE HCL 50 MG/ML IM SOLN: 50.0000 mg | Freq: Four times a day (QID) | INTRAMUSCULAR | Status: DC | PRN

## 2024-10-04 MED ORDER — LACTATED RINGERS IV SOLN: INTRAVENOUS | Status: DC

## 2024-10-04 MED ORDER — FAMOTIDINE IN NACL 20-0.9 MG/50ML-% IV SOLN
20.0000 mg | Freq: Two times a day (BID) | INTRAVENOUS | Status: DC
  Administered 2024-10-04: 20 mg via INTRAVENOUS
  Filled 2024-10-04 (×2): qty 50

## 2024-10-04 MED ORDER — SODIUM CHLORIDE 0.9 % IV SOLN: 8.0000 mg | Freq: Three times a day (TID) | INTRAVENOUS | Status: DC | PRN

## 2024-10-04 MED ORDER — ONDANSETRON 4 MG PO TBDP: 4.0000 mg | ORAL_TABLET | Freq: Three times a day (TID) | ORAL | Status: DC | PRN

## 2024-10-04 MED ORDER — FAMOTIDINE 20 MG PO TABS: 20.0000 mg | ORAL_TABLET | Freq: Two times a day (BID) | ORAL | Status: DC

## 2024-10-04 MED ORDER — ACETAMINOPHEN 325 MG PO TABS: 650.0000 mg | ORAL_TABLET | ORAL | Status: DC | PRN

## 2024-10-04 MED ORDER — ONDANSETRON HCL 4 MG/2ML IJ SOLN: 4.0000 mg | Freq: Three times a day (TID) | INTRAMUSCULAR | Status: DC | PRN

## 2024-10-04 MED ORDER — CALCIUM CARBONATE ANTACID 500 MG PO CHEW: 2.0000 | CHEWABLE_TABLET | ORAL | Status: DC | PRN

## 2024-10-04 MED ORDER — METOCLOPRAMIDE HCL 10 MG PO TABS: 10.0000 mg | ORAL_TABLET | Freq: Four times a day (QID) | ORAL | Status: DC

## 2024-10-04 MED ORDER — HYDROXYZINE HCL 25 MG PO TABS: 50.0000 mg | ORAL_TABLET | Freq: Four times a day (QID) | ORAL | Status: DC | PRN

## 2024-10-04 MED ORDER — PROMETHAZINE HCL 25 MG PO TABS: 12.5000 mg | ORAL_TABLET | ORAL | Status: DC | PRN

## 2024-10-04 NOTE — Progress Notes (Signed)
 Pt called RN in room around 2315; pt states I  need to leave, I can't stay, I have anxiety and some things from my past are coming up; RN explained that she needed to call the provider; RN explained that 2 things could happen:  1: the provider discharges her or 2: the provider doesn't discharge her but she has rights and can leave and it's called leaving against medical advice; if the provider doesn't give a discharge order, RN asked if pt willing to sign a leaving against medical advice paper; pt said yes, I'll sign it; RN contacted zelda hummer, CNM; she isn't going to discharge pt tonight; pt signed AMA paper and did end up leaving; pt left at 2340; pt's significant other is already present (and has been present) at the bedside; pt declined a wheelchair; pt ambulated with her significant other to medical mall to go home
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
## 2024-10-04 NOTE — Progress Notes (Signed)
 Pt called earlier to say she cannot keep anything down despite medication   TC to pt  Kaydance reports she is not able to keep anything down, was able to have Gatorade and a few popsicles, but now if I take a sip of water it comes up right away . She last ate a cheese stick Saturday evening, took small bites, within secodns was vomiting.  SABRAHer body is achey on the inside and muscles hurt, mouth is dry, She was previously prescribed Zofran  and Phenergan  by her PCP. Zofran  given her the shakes. She was prescribed Diclegis and Reglan  by this CNM  She has been taking Diclegis as prescribed x 1 week, ran out of Zofran -gives her shakes, phenergan  supp yesterday afternoon, felt weak.after.  Aside from Diclegis she has not taken any other medications today. She as vomited 4-5 times today   Dr Starla called, agrees pt shoul dbe directly adminted for hyperemsie Zelda Hummer CNM, CM on call, called, aware of plan  Dilcia caleld back, instructed to go directly to the hospital for a direct admit. Jinnie Cookey, CNM  [facility] 10/04/24  12:26 PM

## 2024-10-07 NOTE — Discharge Summary (Signed)
 Pt left hospital AMA. She completed AMA forms. Encouraged her to stay through the night to get treatment for hyperemesis. Discussed benefits of treatment and potential risks to her and her baby due to in completed treatment. She verbalizes understanding and She declined to stay.   Leah Hartman, CNM

## 2024-10-25 ENCOUNTER — Encounter: Payer: Self-pay | Admitting: Certified Nurse Midwife

## 2024-10-25 ENCOUNTER — Ambulatory Visit: Admitting: Certified Nurse Midwife

## 2024-10-25 VITALS — BP 118/60 | HR 119 | Wt 226.8 lb

## 2024-10-25 DIAGNOSIS — O210 Mild hyperemesis gravidarum: Secondary | ICD-10-CM

## 2024-10-25 DIAGNOSIS — Z3480 Encounter for supervision of other normal pregnancy, unspecified trimester: Secondary | ICD-10-CM

## 2024-10-25 DIAGNOSIS — R1110 Vomiting, unspecified: Secondary | ICD-10-CM

## 2024-10-25 DIAGNOSIS — Z3A16 16 weeks gestation of pregnancy: Secondary | ICD-10-CM

## 2024-10-25 DIAGNOSIS — Z1379 Encounter for other screening for genetic and chromosomal anomalies: Secondary | ICD-10-CM

## 2024-10-25 DIAGNOSIS — O99342 Other mental disorders complicating pregnancy, second trimester: Secondary | ICD-10-CM | POA: Diagnosis not present

## 2024-10-25 DIAGNOSIS — F419 Anxiety disorder, unspecified: Secondary | ICD-10-CM

## 2024-10-25 DIAGNOSIS — Z3689 Encounter for other specified antenatal screening: Secondary | ICD-10-CM

## 2024-10-25 MED ORDER — HYDROXYZINE HCL 10 MG PO TABS: 10.0000 mg | ORAL_TABLET | Freq: Two times a day (BID) | ORAL | 0 refills | Status: AC | PRN

## 2024-10-25 NOTE — Assessment & Plan Note (Signed)
 Has felt a lot better over the past few weeks. She is now eating. Taking Dicligis 2-4 times daily.

## 2024-10-25 NOTE — Progress Notes (Signed)
    Return Prenatal Note   Subjective   28 y.o. G1P0000 at [redacted]w[redacted]d presents for this follow-up prenatal visit.  Patient is having issues with anxiety and insomnia. She does not have any fetal movement. She consent to AFP today.  Patient reports: Movement: Absent Contractions: Not present  Objective   Flow sheet Vitals: Pulse Rate: (!) 119 BP: 118/60 Total weight gain: -33 lb 3.2 oz (-15.1 kg)  General Appearance  No acute distress, well appearing, and well nourished Pulmonary   Normal work of breathing Neurologic   Alert and oriented to person, place, and time Psychiatric   Mood and affect within normal limits   Assessment/Plan   Plan  28 y.o. G1P0000 at [redacted]w[redacted]d presents for follow-up OB visit. Reviewed prenatal record including previous visit note.  Hyperemesis Has felt a lot better over the past few weeks. She is now eating. Taking Dicligis 2-4 times daily.   Anxiety Feeling a lot of anxiety this pregnancy. Having trouble with sleep. Not able to fall asleep until 4am. Will trial hydroxyzine  at night to see if symptoms improve with improved sleep. Will consider SSRI if symptoms persist.   Supervision of other normal pregnancy, antepartum Anatomy US  scheduled today. Reviewed red flag warning signs anticipatory guidance for upcoming prenatal care.        Future Appointments  Date Time Provider Department Center  11/22/2024  1:00 PM [facility] US  1 [facility] None  11/22/2024  2:35 PM Jayne Raisin L, CNM [facility] None  06/03/2025  8:00 AM Chrzanowski, Jami B, NP [facility] None    For next visit:  continue with routine prenatal care and anatomy u/s.    Damien Parsley, CNM [facility] 12/01/254:53 PM

## 2024-10-25 NOTE — Assessment & Plan Note (Signed)
 Anatomy US  scheduled today. Reviewed red flag warning signs anticipatory guidance for upcoming prenatal care.

## 2024-10-25 NOTE — Patient Instructions (Signed)
 Second Trimester of Pregnancy  The second trimester of pregnancy is from week 14 through week 27. This is months 4 through 6 of pregnancy. During the second trimester: Morning sickness is less or has stopped. You may have more energy. You may feel hungry more often. At this time, your unborn baby is growing very fast. At the end of the sixth month, the unborn baby may be up to 12 inches long and weigh about 1 pounds. You will likely start to feel the baby move between 16 and 20 weeks of pregnancy. Body changes during your second trimester Your body continues to change during this time. The changes usually go away after your baby is born. Physical changes You will gain more weight. Your belly will get bigger. You may begin to get stretch marks on your hips, belly, and breasts. Your breasts will keep growing and may hurt. You may get dark spots or blotches on your face. A dark line from your belly button to the pubic area may appear. This line is called linea nigra. Your hair may grow faster and get thicker. Health changes You may have headaches. You may have heartburn. You may pee more often. You may have swollen, bulging veins (varicose veins). You may have trouble pooping (constipation), or swollen veins in the butt that can itch or get painful (hemorrhoids). You may have back pain. This is caused by: Weight gain. Pregnancy hormones that are relaxing the joints in your pelvis. Follow these instructions at home: Medicines Talk to your health care provider if you're taking medicines. Ask if the medicines are safe to take during pregnancy. Your provider may change the medicines that you take. Do not take any medicines unless told to by your provider. Take a prenatal vitamin that has at least 600 micrograms (mcg) of folic acid. Do not use herbal medicines, illegal drugs, or medicines that are not approved by your provider. Eating and drinking While you're pregnant your body needs  extra food for your growing baby. Talk with your provider about what to eat while pregnant. Activity Most women are able to exercise during pregnancy. Exercises may need to change as your pregnancy goes on. Talk to your provider about your activities and exercise routines. Relieving pain and discomfort Wear a good, supportive bra if your breasts hurt. Rest with your legs raised if you have leg cramps or low back pain. Take warm sitz baths to soothe pain from hemorrhoids. Use hemorrhoid cream if your provider says it's okay. Do not douche. Do not use tampons or scented pads. Do not use hot tubs, steam rooms, or saunas. Safety Wear your seatbelt at all times when you're in a car. Talk to your provider if someone hits you, hurts you, or yells at you. Talk with your provider if you're feeling sad or have thoughts of hurting yourself. Lifestyle Certain things can be harmful while you're pregnant. It's best to avoid the following: Do not drink alcohol,smoke, vape, or use products with nicotine or tobacco in them. If you need help quitting, talk with your provider. Avoid cat litter boxes and soil used by cats. These things carry germs that can cause harm to your pregnancy and your baby. General instructions Keep all follow-up visits. It helps you and your unborn baby stay as healthy as possible. Write down your questions. Take them to your prenatal visits. Your provider will: Talk with you about your overall health. Give you advice or refer you to specialists who can help with different needs,  including: Prenatal education classes. Mental health and counseling. Foods and healthy eating. Ask for help if you need help with food. Where to find more information American Pregnancy Association: americanpregnancy.org [employer]: acog.org Office on [employer] Health: [employer] Contact a health care provider if: You have a headache that does not go away  when you take medicine. You have any of these problems: You can't eat or drink. You throw up or feel like you may throw up. You have watery poop (diarrhea) for 2 days or more. You have pain when you pee or your pee smells bad. You have been sick for 2 days or more and are not getting better. Contact your provider right away if: You have any of these coming from your vagina: Abnormal discharge. Bad-smelling fluid. Bleeding. Your baby is moving less than usual. You have contractions, belly cramping, or have pain in your pelvis or lower back. You have symptoms of high blood pressure or preeclampsia. These include: A severe, throbbing headache that does not go away. Sudden or extreme swelling of your face, hands, legs, or feet. Vision problems: You see spots. You have blurry vision. Your eyes are sensitive to light. If you can't reach the provider, go to an urgent care or emergency room. Get help right away if: You faint, become confused, or can't think clearly. You have chest pain or trouble breathing. You have any kind of injury, such as from a fall or a car crash. These symptoms may be an emergency. Call 911 right away. Do not wait to see if the symptoms will go away. Do not drive yourself to the hospital. This information is not intended to replace advice given to you by your health care provider. Make sure you discuss any questions you have with your health care provider. Document Revised: 08/14/2023 Document Reviewed: 03/14/2023 Elsevier Patient Education  2024 [employer].

## 2024-10-25 NOTE — Assessment & Plan Note (Signed)
 Feeling a lot of anxiety this pregnancy. Having trouble with sleep. Not able to fall asleep until 4am. Will trial hydroxyzine  at night to see if symptoms improve with improved sleep. Will consider SSRI if symptoms persist.

## 2024-11-02 ENCOUNTER — Ambulatory Visit

## 2024-11-22 ENCOUNTER — Ambulatory Visit: Admitting: Certified Nurse Midwife

## 2024-11-22 ENCOUNTER — Ambulatory Visit

## 2024-11-22 VITALS — BP 96/69 | HR 103 | Wt 256.2 lb

## 2024-11-22 DIAGNOSIS — Z363 Encounter for antenatal screening for malformations: Secondary | ICD-10-CM

## 2024-11-22 DIAGNOSIS — Z3A20 20 weeks gestation of pregnancy: Secondary | ICD-10-CM | POA: Diagnosis not present

## 2024-11-22 DIAGNOSIS — Z3480 Encounter for supervision of other normal pregnancy, unspecified trimester: Secondary | ICD-10-CM

## 2024-11-22 DIAGNOSIS — Z369 Encounter for antenatal screening, unspecified: Secondary | ICD-10-CM

## 2024-11-22 DIAGNOSIS — Z3482 Encounter for supervision of other normal pregnancy, second trimester: Secondary | ICD-10-CM

## 2024-11-22 MED ORDER — PANTOPRAZOLE SODIUM 20 MG PO TBEC: 20.0000 mg | DELAYED_RELEASE_TABLET | Freq: Every day | ORAL | 1 refills | Status: AC

## 2024-11-22 NOTE — Assessment & Plan Note (Signed)
 Red flag symptoms reviewed. Declines flu vaccine. Anatomy u/s today, incomplete heart view f/u in 3-4w.

## 2024-11-22 NOTE — Progress Notes (Signed)
" ° ° °  Return Prenatal Note   Subjective   28 y.o. G1P0000 at [redacted]w[redacted]d presents for this follow-up prenatal visit.  Patient feeling well, now only nausea/vomiting one to two times a day. Having more heartburn despite tums. Now gaining weight! Preliminary u/s report reviewed. Patient reports: Movement: Present Contractions: Not present  Objective   Flow sheet Vitals: Pulse Rate: (!) 103 BP: 96/69 Fundal Height:  (@U ) Fetal Heart Rate (bpm): 150 Total weight gain: -3 lb 12.8 oz (-1.724 kg)  General Appearance  No acute distress, well appearing, and well nourished Pulmonary   Normal work of breathing Neurologic   Alert and oriented to person, place, and time Psychiatric   Mood and affect within normal limits   Assessment/Plan   Plan  28 y.o. G1P0000 at [redacted]w[redacted]d presents for follow-up OB visit. Reviewed prenatal record including previous visit note.  Supervision of other normal pregnancy, antepartum Red flag symptoms reviewed. Declines flu vaccine. Anatomy u/s today, incomplete heart view f/u in 3-4w.      Orders Placed This Encounter  Procedures   US  OB Follow Up    Standing Status:   Future    Expected Date:   12/23/2024    Expiration Date:   11/22/2025    Reason for exam::   heart views, incomplete anatomy u/s    Preferred imaging location?:   Internal   Return in 4 weeks (on 12/20/2024) for ROB.   Future Appointments  Date Time Provider Department Center  12/13/2024  2:00 PM [facility] US  1 [facility] None  12/20/2024  3:15 PM Justino Eleanor HERO, CNM [facility] None  06/03/2025  8:00 AM Chrzanowski, Shasta NOVAK, NP [facility] None    For next visit:  continue with routine prenatal care     Harlene LITTIE Cisco, CNM  12/29/20252:53 PM  "

## 2024-11-22 NOTE — Patient Instructions (Addendum)
 Second Trimester of Pregnancy  The second trimester of pregnancy is from week 14 through week 27. This is months 4 through 6 of pregnancy. During the second trimester: Morning sickness is less or has stopped. You may have more energy. You may feel hungry more often. At this time, your unborn baby is growing very fast. At the end of the sixth month, the unborn baby may be up to 12 inches long and weigh about 1 pounds. You will likely start to feel the baby move between 16 and 20 weeks of pregnancy. Body changes during your second trimester Your body continues to change during this time. The changes usually go away after your baby is born. Physical changes You will gain more weight. Your belly will get bigger. You may begin to get stretch marks on your hips, belly, and breasts. Your breasts will keep growing and may hurt. You may get dark spots or blotches on your face. A dark line from your belly button to the pubic area may appear. This line is called linea nigra. Your hair may grow faster and get thicker. Health changes You may have headaches. You may have heartburn. You may pee more often. You may have swollen, bulging veins (varicose veins). You may have trouble pooping (constipation), or swollen veins in the butt that can itch or get painful (hemorrhoids). You may have back pain. This is caused by: Weight gain. Pregnancy hormones that are relaxing the joints in your pelvis. Follow these instructions at home: Medicines Talk to your health care provider if you're taking medicines. Ask if the medicines are safe to take during pregnancy. Your provider may change the medicines that you take. Do not take any medicines unless told to by your provider. Take a prenatal vitamin that has at least 600 micrograms (mcg) of folic acid. Do not use herbal medicines, illegal drugs, or medicines that are not approved by your provider. Eating and drinking While you're pregnant your body needs  extra food for your growing baby. Talk with your provider about what to eat while pregnant. Activity Most women are able to exercise during pregnancy. Exercises may need to change as your pregnancy goes on. Talk to your provider about your activities and exercise routines. Relieving pain and discomfort Wear a good, supportive bra if your breasts hurt. Rest with your legs raised if you have leg cramps or low back pain. Take warm sitz baths to soothe pain from hemorrhoids. Use hemorrhoid cream if your provider says it's okay. Do not douche. Do not use tampons or scented pads. Do not use hot tubs, steam rooms, or saunas. Safety Wear your seatbelt at all times when you're in a car. Talk to your provider if someone hits you, hurts you, or yells at you. Talk with your provider if you're feeling sad or have thoughts of hurting yourself. Lifestyle Certain things can be harmful while you're pregnant. It's best to avoid the following: Do not drink alcohol,smoke, vape, or use products with nicotine or tobacco in them. If you need help quitting, talk with your provider. Avoid cat litter boxes and soil used by cats. These things carry germs that can cause harm to your pregnancy and your baby. General instructions Keep all follow-up visits. It helps you and your unborn baby stay as healthy as possible. Write down your questions. Take them to your prenatal visits. Your provider will: Talk with you about your overall health. Give you advice or refer you to specialists who can help with different needs,  including: Prenatal education classes. Mental health and counseling. Foods and healthy eating. Ask for help if you need help with food. Where to find more information American Pregnancy Association: americanpregnancy.org [employer]: acog.org Office on [employer] Health: [place] Contact a health care provider if: You have a headache that does not go away  when you take medicine. You have any of these problems: You can't eat or drink. You throw up or feel like you may throw up. You have watery poop (diarrhea) for 2 days or more. You have pain when you pee or your pee smells bad. You have been sick for 2 days or more and are not getting better. Contact your provider right away if: You have any of these coming from your vagina: Abnormal discharge. Bad-smelling fluid. Bleeding. Your baby is moving less than usual. You have contractions, belly cramping, or have pain in your pelvis or lower back. You have symptoms of high blood pressure or preeclampsia. These include: A severe, throbbing headache that does not go away. Sudden or extreme swelling of your face, hands, legs, or feet. Vision problems: You see spots. You have blurry vision. Your eyes are sensitive to light. If you can't reach the provider, go to an urgent care or emergency room. Get help right away if: You faint, become confused, or can't think clearly. You have chest pain or trouble breathing. You have any kind of injury, such as from a fall or a car crash. These symptoms may be an emergency. Call 911 right away. Do not wait to see if the symptoms will go away. Do not drive yourself to the hospital. This information is not intended to replace advice given to you by your health care provider. Make sure you discuss any questions you have with your health care provider. Document Revised: 08/14/2023 Document Reviewed: 03/14/2023 Elsevier Patient Education  2024 [employer]  Tests and Screening During Pregnancy Tests and screenings during pregnancy are an important part of your prenatal care. These tests help your health care provider find any problems that might affect your pregnancy. Some tests need to be done for all pregnant people, and some are optional. Most of the tests and screenings do not pose any risks for you or your baby. You may need more testing if a test result  shows there is a risk to your health or your baby's health. Tests and screenings done early in pregnancy Some tests and screenings you may have in early pregnancy are: Blood tests, such as: Complete blood count (CBC). Blood typing. Tests to check for diseases that can cause birth defects or can be passed to your baby, such as: German measles (rubella( and chicken pox. Hepatitis B and C. Human Immunodeficiency Virus (HIV). Syphilis. Zika virus. Pee tests. Blood pressure. Testing for sexually transmitted infections (STIs), such as chlamydia or gonorrhea. Testing for tuberculosis. Ultrasound. Tests and screenings done later in pregnancy Some common tests you can expect to have later in pregnancy include: Rh antibody testing. Pee and blood tests. Glucose screening. This checks your blood sugar. It will show whether you are developing the type of diabetes that happens during pregnancy, called gestational diabetes. You may have this screening earlier if you have risk factors for diabetes. Ultrasound. This may be repeated at 16-20 weeks to check how your baby is growing. Screening for group B streptococcus (GBS). GBS is a type of bacteria that may live in your rectum or vagina. GBS can spread to your baby during birth.  This test is done at 35-37 weeks of pregnancy. Non-stress test. This may be done more often if your pregnancy is high risk. Biophysical profile. This test includes ultrasound imaging and a non-stress test to check to see if your baby is healthy. This test may help decide when your baby should be born. Screening for birth defects Early in your pregnancy, tests can be done to find out if your baby is at risk for a genetic disorder. This testing is optional. The type of testing recommended for you will depend on your family and medical history, your ethnicity, and your age. Testing may include: Screening tests such as ultrasound, blood tests, or a combination of both. Carrier  screening. If genetic screening shows that your baby is at risk for a genetic defect, diagnostic testing may be recommended, such as: Amniocentesis. Chorionic villus sampling. Unlike other tests done during pregnancy, diagnostic testing does have some risk for your pregnancy. Talk to your provider about the risks and benefits of genetic testing. Questions to ask your health care provider What tests are recommended for me? When and how will these tests be done? When will I get the results of the tests? What do the results of these tests mean for me or my baby? Do you recommend any genetic screening tests? Which ones? Should I see a genetic counselor before having genetic screening? Where to find more information Go to americanpregnancy.org Click on search. Type 'prenatal tests in the search box. Go to [place] Click on search. Type 'prenatal tests in the search box. Go to acog.org Click on search. Type routine tests in the search box. This information is not intended to replace advice given to you by your health care provider. Make sure you discuss any questions you have with your health care provider. Document Revised: 09/09/2023 Document Reviewed: 09/09/2023 Elsevier Patient Education  2025 [employer]. Second Trimester of Pregnancy  The second trimester of pregnancy is from week 14 through week 27. This is months 4 through 6 of pregnancy. During the second trimester: Morning sickness is less or has stopped. You may have more energy. You may feel hungry more often. At this time, your unborn baby is growing very fast. At the end of the sixth month, the unborn baby may be up to 12 inches long and weigh about 1 pounds. You will likely start to feel the baby move between 16 and 20 weeks of pregnancy. Body changes during your second trimester Your body continues to change during this time. The changes usually go away after your baby is born. Physical changes You will  gain more weight. Your belly will get bigger. You may begin to get stretch marks on your hips, belly, and breasts. Your breasts will keep growing and may hurt. You may get dark spots or blotches on your face. A dark line from your belly button to the pubic area may appear. This line is called linea nigra. Your hair may grow faster and get thicker. Health changes You may have headaches. You may have heartburn. You may pee more often. You may have swollen, bulging veins (varicose veins). You may have trouble pooping (constipation), or swollen veins in the butt that can itch or get painful (hemorrhoids). You may have back pain. This is caused by: Weight gain. Pregnancy hormones that are relaxing the joints in your pelvis. Follow these instructions at home: Medicines Talk to your health care provider if you're taking medicines. Ask if the medicines are safe to take during pregnancy.  Your provider may change the medicines that you take. Do not take any medicines unless told to by your provider. Take a prenatal vitamin that has at least 600 micrograms (mcg) of folic acid. Do not use herbal medicines, illegal drugs, or medicines that are not approved by your provider. Eating and drinking While you're pregnant your body needs extra food for your growing baby. Talk with your provider about what to eat while pregnant. Activity Most women are able to exercise during pregnancy. Exercises may need to change as your pregnancy goes on. Talk to your provider about your activities and exercise routines. Relieving pain and discomfort Wear a good, supportive bra if your breasts hurt. Rest with your legs raised if you have leg cramps or low back pain. Take warm sitz baths to soothe pain from hemorrhoids. Use hemorrhoid cream if your provider says it's okay. Do not douche. Do not use tampons or scented pads. Do not use hot tubs, steam rooms, or saunas. Safety Wear your seatbelt at all times when you're  in a car. Talk to your provider if someone hits you, hurts you, or yells at you. Talk with your provider if you're feeling sad or have thoughts of hurting yourself. Lifestyle Certain things can be harmful while you're pregnant. It's best to avoid the following: Do not drink alcohol,smoke, vape, or use products with nicotine or tobacco in them. If you need help quitting, talk with your provider. Avoid cat litter boxes and soil used by cats. These things carry germs that can cause harm to your pregnancy and your baby. General instructions Keep all follow-up visits. It helps you and your unborn baby stay as healthy as possible. Write down your questions. Take them to your prenatal visits. Your provider will: Talk with you about your overall health. Give you advice or refer you to specialists who can help with different needs, including: Prenatal education classes. Mental health and counseling. Foods and healthy eating. Ask for help if you need help with food. Where to find more information American Pregnancy Association: americanpregnancy.org [employer]: acog.org Office on [employer] Health: [place] Contact a health care provider if: You have a headache that does not go away when you take medicine. You have any of these problems: You can't eat or drink. You throw up or feel like you may throw up. You have watery poop (diarrhea) for 2 days or more. You have pain when you pee or your pee smells bad. You have been sick for 2 days or more and are not getting better. Contact your provider right away if: You have any of these coming from your vagina: Abnormal discharge. Bad-smelling fluid. Bleeding. Your baby is moving less than usual. You have contractions, belly cramping, or have pain in your pelvis or lower back. You have symptoms of high blood pressure or preeclampsia. These include: A severe, throbbing headache that does not go  away. Sudden or extreme swelling of your face, hands, legs, or feet. Vision problems: You see spots. You have blurry vision. Your eyes are sensitive to light. If you can't reach the provider, go to an urgent care or emergency room. Get help right away if: You faint, become confused, or can't think clearly. You have chest pain or trouble breathing. You have any kind of injury, such as from a fall or a car crash. These symptoms may be an emergency. Call 911 right away. Do not wait to see if the symptoms will go away. Do not drive  yourself to the hospital. This information is not intended to replace advice given to you by your health care provider. Make sure you discuss any questions you have with your health care provider. Document Revised: 08/14/2023 Document Reviewed: 03/14/2023 Elsevier Patient Education  2024 [employer] Inc. Problems to Watch for During Pregnancy During pregnancy, your body goes through many changes. Some changes may be uncomfortable. But most changes are not a serious problem. It's important to learn when certain signs and symptoms may be a problem. Talk with your health care provider about any medical conditions you have. Make sure you know the symptoms to watch for. Reporting problems early will prevent complications. Problems to watch for during pregnancy You're more likely to get an infection during pregnancy. Let your provider know if you have signs of infection, such as: A fever. A bad-smelling fluid from your vagina. Peeing too often, wanting to pee urgently, or pain when you pee. Also, let your provider know if: You're very tired, you feel dizzy, or you faint. You have watery poop (diarrhea) for 24 hours or longer. You throw up or feel like throwing up for 24 hours or longer. You have cramping in your belly or have pain in your hips or lower back. You have spotting, bleeding, or leaking of fluid from your vagina. You have pain, swelling, or redness in an arm  or leg. You should also watch for signs of high blood pressure and preeclampsia. These signs can be very serious. They include: A headache that doesn't go away when you take medicine. Sudden or very bad swelling of your face, hands, legs, or feet. Problems seeing, such as: You see spots. You have blurry vision. You may be sensitive to light. Why it's important to watch for these problems Watching and reporting problems to your provider can help prevent complications that may affect you and your baby. These include: Higher risk of giving birth early. Infection that may be passed on to your baby. Higher risk for stillbirth. Follow these instructions at home:  Take your medicines only as told. Keep all follow-up visits. Your provider needs to monitor your health and your baby's health. Where to find more information To learn more, go to these websites: Centers for Disease Control and Prevention (CDC) at tonerpromos.no. Then: Click Health Topics A-Z. Type urgent maternal warning signs in the search box. [employer] (ACOG): acog.org Contact a health care provider if: You have any problems while you're pregnant. You feel your baby moving less than usual. You have any of these things: You have strong emotions, such as sadness or anxiety, that affect your daily life. You do not feel safe in your home. You use tobacco, alcohol, or drugs, and you need help to stop. Get help right away if: You faint, have a seizure, or cannot think clearly. You have chest pain or difficulty breathing. You have any of the following symptoms and you were unable to reach your provider: You have symptoms of infection, including a fever, or have vaginal bleeding. You have symptoms of high blood pressure or preeclampsia. You have signs or symptoms of labor before 37 weeks of pregnancy. These include: Contractions that are 5 minutes or less apart, or that increase in frequency,  intensity, or length. Sudden, sharp pain in the belly, or low back pain. Any amount of fluid that flows from your vagina without stopping. These symptoms may be an emergency. Call 911 right away. Do not wait to see if the symptoms  will go away. Do not drive yourself to the hospital. This information is not intended to replace advice given to you by your health care provider. Make sure you discuss any questions you have with your health care provider. Document Revised: 04/22/2023 Document Reviewed: 04/22/2023 Elsevier Patient Education  2024 [employer].

## 2024-12-13 ENCOUNTER — Ambulatory Visit

## 2024-12-13 DIAGNOSIS — Z3A24 24 weeks gestation of pregnancy: Secondary | ICD-10-CM | POA: Diagnosis not present

## 2024-12-13 DIAGNOSIS — Z363 Encounter for antenatal screening for malformations: Secondary | ICD-10-CM

## 2024-12-20 ENCOUNTER — Encounter: Admitting: Obstetrics

## 2024-12-20 DIAGNOSIS — Z131 Encounter for screening for diabetes mellitus: Secondary | ICD-10-CM

## 2024-12-20 DIAGNOSIS — Z130 Encounter for screening for diseases of the blood and blood-forming organs and certain disorders involving the immune mechanism: Secondary | ICD-10-CM

## 2024-12-20 DIAGNOSIS — Z113 Encounter for screening for infections with a predominantly sexual mode of transmission: Secondary | ICD-10-CM

## 2024-12-22 ENCOUNTER — Other Ambulatory Visit: Payer: Self-pay

## 2024-12-22 ENCOUNTER — Encounter: Payer: Self-pay | Admitting: Obstetrics and Gynecology

## 2024-12-22 ENCOUNTER — Inpatient Hospital Stay
Admission: EM | Admit: 2024-12-22 | Discharge: 2024-12-22 | Disposition: A | Discharge status: Home / Self Care | Source: Ambulatory Visit | Attending: Obstetrics and Gynecology | Admitting: Obstetrics and Gynecology

## 2024-12-22 ENCOUNTER — Encounter: Admitting: Obstetrics

## 2024-12-22 ENCOUNTER — Telehealth: Payer: Self-pay

## 2024-12-22 DIAGNOSIS — Z7982 Long term (current) use of aspirin: Secondary | ICD-10-CM | POA: Diagnosis not present

## 2024-12-22 DIAGNOSIS — Z3A24 24 weeks gestation of pregnancy: Secondary | ICD-10-CM | POA: Insufficient documentation

## 2024-12-22 DIAGNOSIS — Z79899 Other long term (current) drug therapy: Secondary | ICD-10-CM | POA: Diagnosis not present

## 2024-12-22 DIAGNOSIS — R109 Unspecified abdominal pain: Secondary | ICD-10-CM | POA: Insufficient documentation

## 2024-12-22 DIAGNOSIS — F17200 Nicotine dependence, unspecified, uncomplicated: Secondary | ICD-10-CM

## 2024-12-22 DIAGNOSIS — O26892 Other specified pregnancy related conditions, second trimester: Secondary | ICD-10-CM | POA: Diagnosis not present

## 2024-12-22 DIAGNOSIS — E669 Obesity, unspecified: Secondary | ICD-10-CM

## 2024-12-22 DIAGNOSIS — Z87891 Personal history of nicotine dependence: Secondary | ICD-10-CM | POA: Insufficient documentation

## 2024-12-22 LAB — CBC WITH DIFFERENTIAL/PLATELET
Abs Immature Granulocytes: 0.2 10*3/uL — ABNORMAL HIGH (ref 0.00–0.07)
Basophils Absolute: 0 10*3/uL (ref 0.0–0.1)
Basophils Relative: 0 %
Eosinophils Absolute: 0 10*3/uL (ref 0.0–0.5)
Eosinophils Relative: 0 %
HCT: 35.8 % — ABNORMAL LOW (ref 36.0–46.0)
Hemoglobin: 11.8 g/dL — ABNORMAL LOW (ref 12.0–15.0)
Immature Granulocytes: 2 %
Lymphocytes Relative: 17 %
Lymphs Abs: 1.8 10*3/uL (ref 0.7–4.0)
MCH: 28.8 pg (ref 26.0–34.0)
MCHC: 33 g/dL (ref 30.0–36.0)
MCV: 87.3 fL (ref 80.0–100.0)
Monocytes Absolute: 0.7 10*3/uL (ref 0.1–1.0)
Monocytes Relative: 7 %
Neutro Abs: 8.1 10*3/uL — ABNORMAL HIGH (ref 1.7–7.7)
Neutrophils Relative %: 74 %
Platelets: 221 10*3/uL (ref 150–400)
RBC: 4.1 MIL/uL (ref 3.87–5.11)
RDW: 13.5 % (ref 11.5–15.5)
WBC: 10.9 10*3/uL — ABNORMAL HIGH (ref 4.0–10.5)
nRBC: 0 % (ref 0.0–0.2)

## 2024-12-22 LAB — WET PREP, GENITAL
Clue Cells Wet Prep HPF POC: NONE SEEN
Sperm: NONE SEEN
Trich, Wet Prep: NONE SEEN
WBC, Wet Prep HPF POC: >=10 — AB
Yeast Wet Prep HPF POC: NONE SEEN

## 2024-12-22 LAB — URINALYSIS, COMPLETE (UACMP) WITH MICROSCOPIC
Bilirubin Urine: NEGATIVE
Glucose, UA: NEGATIVE mg/dL
Ketones, ur: 5 mg/dL — AB
Nitrite: NEGATIVE
Protein, ur: 30 mg/dL — AB
RBC / HPF: >50 RBC/hpf (ref 0–5)
Specific Gravity, Urine: 1.019 (ref 1.005–1.030)
pH: 6 (ref 5.0–8.0)

## 2024-12-22 LAB — CHLAMYDIA/NGC RT PCR (ARMC ONLY)
Chlamydia Tr: NOT DETECTED
N gonorrhoeae: NOT DETECTED

## 2024-12-22 MED ORDER — ACETAMINOPHEN 325 MG PO TABS
650.0000 mg | ORAL_TABLET | Freq: Once | ORAL | Status: AC
  Administered 2024-12-22: 650 mg via ORAL
  Filled 2024-12-22: qty 2

## 2024-12-22 MED ORDER — EPHEDRINE 5 MG/ML INJ
INTRAVENOUS | Status: AC
  Filled 2024-12-22: qty 5

## 2024-12-22 NOTE — OB Triage Provider Note (Signed)
 "  OB/Triage Note  Patient ID: Leah Hartman MRN: [id number] DOB/AGE: Mar 02, 1996 28 y.o.  Subjective  History of Present Illness: The patient is a 29 y.o. female G1P0000 at [redacted]w[redacted]d who presents for lower abdominal cramping. The cramping started about 8pm last night. It feels like endometriosis period cramps, it is constant. She took 1 350mg  Tylenol  around 9pm and took a bath, she did not get any relief, she slept on and off over the night-which is normal for her. Today she woke up feeling the same pain, she took another 350mg  Tylenol   Around noon. She last had a BM this morning, which was normal and easy to pass. She has BM daily. She last had IC last night. Just now she noticed her discharge is a green-yellow denies any odor. She has noticed feeling like she cannot take a deep breath since this morning, this happens when she feels the pain .Denies any urinary symptoms, fevers or vaginal bleeding. She feels that she completely empties her bladder when she urinates. Reports she is better with her hydration.  She endorses + FM.  Past Medical History:  Diagnosis Date   Abdominal pain    Anxiety    Depression    Dysmenorrhea 05/10/2015   Endometriosis    Gastritis    egd 04/07/15    GERD (gastroesophageal reflux disease)    Interstitial cystitis    Lumbar radiculopathy    Nausea & vomiting    Ovarian cyst    Ovarian cyst rupture    POTS (postural orthostatic tachycardia syndrome)    Syncope and collapse    Vitamin D  deficiency     Past Surgical History:  Procedure Laterality Date   APPENDECTOMY     CHOLECYSTECTOMY  10/2023   ESOPHAGOGASTRODUODENOSCOPY N/A 04/07/2015   Procedure: ESOPHAGOGASTRODUODENOSCOPY (EGD);  Surgeon: Rogelia Copping, MD;  Location: [facility];  Service: Gastroenterology;  Laterality: N/A;   INTRAUTERINE DEVICE (IUD) INSERTION N/A 05/15/2015   Procedure: INTRAUTERINE DEVICE (IUD) INSERTION;  Surgeon: Gladis DELENA Dollar, MD;  Location: [facility];   Service: Gynecology;  Laterality: N/A;   LAPAROSCOPY N/A 05/15/2015   Procedure: with excision and fulgeration of endometriosis;  Surgeon: Gladis DELENA Dollar, MD;  Location: [facility];  Service: Gynecology;  Laterality: N/A;  with excision and fulgeration of endometriosis   loop monitor  09/10/2022   OVARIAN CYST SURGERY Bilateral    TONSILLECTOMY AND ADENOIDECTOMY     TYMPANOSTOMY TUBE PLACEMENT      Medications Ordered Prior to Encounter[1]  Allergies[2]  Social History   Socioeconomic History   Marital status: Married    Spouse name: Not on file   Number of children: 0   Years of education: Not on file   Highest education level: 12th grade  Occupational History   Occupation: [employer]   Occupation: CNA  Tobacco Use   Smoking status: Former    Types: E-cigarettes    Quit date: 07/26/2022    Years since quitting: 2.4   Smokeless tobacco: Never   Tobacco comments:    VAPES  Vaping Use   Vaping status: Former   Devices: Games Developer and Sexual Activity   Alcohol use: Not Currently    Comment: once a month beer   Drug use: No   Sexual activity: Yes    Partners: Male    Birth control/protection: None  Other Topics Concern   Not on file  Social History Narrative   Makes stamps for [employer]  with Dad and no siblings   Drinks etoh    Not smoker    No guns    Wearing seat belt    Safe in relationship    Social Drivers of Health   Tobacco Use: Medium Risk (10/25/2024)   Patient History    Smoking Tobacco Use: Former    Smokeless Tobacco Use: Never    Passive Exposure: Not on file  Financial Resource Strain: Low Risk (08/23/2024)   Overall Financial Resource Strain (CARDIA)    Difficulty of Paying Living Expenses: Not very hard  Food Insecurity: No Food Insecurity (08/23/2024)   Epic    Worried About Radiation Protection Practitioner of Food in the Last Year: Never true    Ran Out of Food in the Last Year: Never true  Transportation Needs: No Transportation  Needs (08/23/2024)   Epic    Lack of Transportation (Medical): No    Lack of Transportation (Non-Medical): No  Physical Activity: Inactive (08/23/2024)   Exercise Vital Sign    Days of Exercise per Week: 0 days    Minutes of Exercise per Session: Not on file  Stress: No Stress Concern Present (08/23/2024)   [employer] of Occupational Health - Occupational Stress Questionnaire    Feeling of Stress: Not at all  Social Connections: Moderately Integrated (08/23/2024)   Social Connection and Isolation Panel    Frequency of Communication with Friends and Family: More than three times a week    Frequency of Social Gatherings with Friends and Family: Twice a week    Attends Religious Services: More than 4 times per year    Active Member of [employer] or Organizations: No    Attends Banker Meetings: Not on file    Marital Status: Married  Intimate Partner Violence: Not At Risk (08/23/2024)   Epic    Fear of Current or Ex-Partner: No    Emotionally Abused: No    Physically Abused: No    Sexually Abused: No  Depression (PHQ2-9): Medium Risk (10/25/2024)   Depression (PHQ2-9)    PHQ-2 Score: 10  Alcohol Screen: Not on file  Housing: Unknown (08/23/2024)   Epic    Unable to Pay for Housing in the Last Year: No    Number of Times Moved in the Last Year: Not on file    Homeless in the Last Year: No  Utilities: Not At Risk (08/23/2024)   Epic    Threatened with loss of utilities: No  Health Literacy: Adequate Health Literacy (08/23/2024)   B1300 Health Literacy    Frequency of need for help with medical instructions: Never    Family History  Problem Relation Age of Onset   Hypertension Mother    Diabetes Father    Learning disabilities Father    Lung cancer Maternal Grandmother    Other Maternal Grandmother        dense breast tissue   Breast cancer Maternal Grandmother 71   Cancer - Lung Maternal Grandmother 61   Bladder Cancer Maternal Grandfather    Brain cancer  Paternal Grandmother    Liver cancer Paternal Grandfather    Colon cancer Neg Hx    Ovarian cancer Neg Hx      Review of Systems  Constitutional: Negative.   HENT: Negative.    Eyes: Negative.   Respiratory:  Positive for shortness of breath.   Cardiovascular: Negative.   Gastrointestinal:  Positive for abdominal pain. Negative for constipation and diarrhea.  Genitourinary: Negative.   Musculoskeletal: Negative.  Neurological: Negative.   Endo/Heme/Allergies: Negative.   Psychiatric/Behavioral: Negative.        Objective  Physical Exam: LMP 07/02/2024 (Exact Date)   Physical Exam Constitutional:      Appearance: Normal appearance.  Genitourinary:     Vulva normal.     Genitourinary Comments: SSE: visually closed, mucus like discharge present, no bleeding   Abdominal:     General: There is no distension.     Palpations: There is no mass.     Tenderness: There is no guarding or rebound.     Hernia: No hernia is present.     Comments: Gravid  Tenderness over suprapubic area  Musculoskeletal:     Cervical back: Normal range of motion.     Right lower leg: No edema.     Left lower leg: No edema.  Neurological:     General: No focal deficit present.     Mental Status: She is alert.  Skin:    General: Skin is warm.  Psychiatric:        Mood and Affect: Mood normal.        Behavior: Behavior normal.        Thought Content: Thought content normal.        Judgment: Judgment normal.     FHT baseline 145, moderate variability, pos accel (10x10) neg decel  Toco none   Significant Findings/ Diagnostic Studies: Wet prep and gc/ct negative, UA Ketones, trace leuks/bacteria, 30+protein. Moderate hgb. Urine culture pending    Hospital Course: The patient was admitted to [facility] for observation. She was given 650mg  Tylenol . Pt reported her pain came down to a 6/10 which is tolerable to her. Given that her exam and labs were negative, low suspicion for an infection or  preterm labor. Pt felt comfortable being discharged.   Assessment: 29 y.o. female G1P0000 at [redacted]w[redacted]d   Plan: Discharge home Follow up: keep scheduled ROB  Teaching: increase hydration, may use up to 1,000mg  Tylenol  every 6 hours   Discharge Instructions     Increase activity slowly   Complete by: As directed       Allergies as of 12/22/2024       Reactions   Promethazine  Anxiety, Other (See Comments)        Medication List     TAKE these medications    aspirin  81 MG chewable tablet Chew 1 tablet (81 mg total) by mouth daily.   Doxylamine -Pyridoxine  10-10 MG Tbec Commonly known as: Diclegis  Take 2 tablets by mouth at bedtime. If symptoms persist, add one tablet in the morning and one in the afternoon   hydrOXYzine  10 MG tablet Commonly known as: ATARAX  Take 1 tablet (10 mg total) by mouth 2 (two) times daily as needed.   pantoprazole  20 MG tablet Commonly known as: Protonix  Take 1 tablet (20 mg total) by mouth daily.   PRENATAL PO Take by mouth.         Jinnie Cookey, CNM   OB-GYN 12/22/2024  6:17 PM      [1]  No current facility-administered medications on file prior to encounter.   Current Outpatient Medications on File Prior to Encounter  Medication Sig Dispense Refill   aspirin  81 MG chewable tablet Chew 1 tablet (81 mg total) by mouth daily.     Doxylamine -Pyridoxine  (DICLEGIS ) 10-10 MG TBEC Take 2 tablets by mouth at bedtime. If symptoms persist, add one tablet in the morning and one in the afternoon 100 tablet 5   hydrOXYzine  (ATARAX )  10 MG tablet Take 1 tablet (10 mg total) by mouth 2 (two) times daily as needed. 30 tablet 0   pantoprazole  (PROTONIX ) 20 MG tablet Take 1 tablet (20 mg total) by mouth daily. 90 tablet 1   Prenatal Vit-Fe Fumarate-FA (PRENATAL PO) Take by mouth.    [2]  Allergies Allergen Reactions   Promethazine  Anxiety and Other (See Comments)   "

## 2024-12-22 NOTE — Progress Notes (Signed)
Patient discharged home, discharge instructions given, patient states understanding. Patient left floor in stable condition, denies any other needs at this time. Patient to keep next scheduled OB appointment 

## 2024-12-22 NOTE — OB Triage Note (Signed)
 Patient arrived with complaints of abdominal pain that started around 2100. Pt states pain in constant and crampy. Pt states this is common at night but usually it goes away and last night it did not even after tylenol  and bath. Last took tylenol  at 1200. Pt states it is 8/10. Denis LOF or VB. Pt states she has had some yellowish/green mucus discharge that is abnormal. Pt denies any change in voiding habits or back pain. Pt states baby is moving well. Pt did complain of not being able to take a deep breath. Pulse ox 97%. Monitors applied and assessing.

## 2024-12-22 NOTE — Discharge Instructions (Addendum)
 Please follow up at your next routine OB appointment. Call your provider or return to the hospital for any concerns. Resume normal activities slowly.

## 2024-12-22 NOTE — Telephone Encounter (Signed)
 Diala called triage stating she's cramping and hurting badly almost like a period cramp, she's tried soaking in a warm bath last night along with tylenol  and then another tylenol  about 2 hours ago with no relief, she's also stating she has shortness of breath. I advised her to go to the ED to be checked out.

## 2024-12-23 ENCOUNTER — Inpatient Hospital Stay: Admit: 2024-12-23 | Payer: Self-pay | Source: Home / Self Care

## 2024-12-23 LAB — URINE CULTURE

## 2025-06-03 ENCOUNTER — Ambulatory Visit: Admitting: Radiology
